# Patient Record
Sex: Female | Born: 1945
Health system: Southern US, Community
[De-identification: ages and names within clinical notes are randomized; demographics above are authoritative.]

## PROBLEM LIST (undated history)

## (undated) DIAGNOSIS — I2721 Secondary pulmonary arterial hypertension: Secondary | ICD-10-CM

## (undated) DIAGNOSIS — R0600 Dyspnea, unspecified: Secondary | ICD-10-CM

## (undated) DIAGNOSIS — A419 Sepsis, unspecified organism: Secondary | ICD-10-CM

## (undated) DIAGNOSIS — E035 Myxedema coma: Secondary | ICD-10-CM

## (undated) DIAGNOSIS — I739 Peripheral vascular disease, unspecified: Secondary | ICD-10-CM

## (undated) DIAGNOSIS — M1990 Unspecified osteoarthritis, unspecified site: Secondary | ICD-10-CM

## (undated) DIAGNOSIS — E039 Hypothyroidism, unspecified: Secondary | ICD-10-CM

## (undated) DIAGNOSIS — Z972 Presence of dental prosthetic device (complete) (partial): Secondary | ICD-10-CM

## (undated) DIAGNOSIS — I2510 Atherosclerotic heart disease of native coronary artery without angina pectoris: Secondary | ICD-10-CM

## (undated) DIAGNOSIS — G9341 Metabolic encephalopathy: Secondary | ICD-10-CM

## (undated) DIAGNOSIS — Z86711 Personal history of pulmonary embolism: Secondary | ICD-10-CM

## (undated) DIAGNOSIS — I3139 Other pericardial effusion (noninflammatory): Secondary | ICD-10-CM

## (undated) HISTORY — PX: TUBAL LIGATION: SHX77

---

## 2002-03-27 ENCOUNTER — Emergency Department (HOSPITAL_COMMUNITY): Admission: EM | Admit: 2002-03-27 | Discharge: 2002-03-27 | Payer: Self-pay | Admitting: Internal Medicine

## 2003-12-24 ENCOUNTER — Other Ambulatory Visit: Payer: Self-pay

## 2010-06-18 ENCOUNTER — Emergency Department (HOSPITAL_COMMUNITY): Admission: EM | Admit: 2010-06-18 | Discharge: 2010-06-18 | Payer: Self-pay | Admitting: Emergency Medicine

## 2010-07-13 ENCOUNTER — Emergency Department (HOSPITAL_COMMUNITY): Admission: EM | Admit: 2010-07-13 | Discharge: 2010-07-14 | Payer: Self-pay | Admitting: Emergency Medicine

## 2010-07-14 ENCOUNTER — Ambulatory Visit: Payer: Self-pay | Admitting: Vascular Surgery

## 2010-07-14 ENCOUNTER — Ambulatory Visit (HOSPITAL_COMMUNITY): Admission: RE | Admit: 2010-07-14 | Discharge: 2010-07-14 | Payer: Self-pay

## 2011-02-15 LAB — CBC
HCT: 39.1 % (ref 36.0–46.0)
MCH: 31.6 pg (ref 26.0–34.0)
Platelets: 244 10*3/uL (ref 150–400)
RBC: 4.12 MIL/uL (ref 3.87–5.11)
RDW: 13.9 % (ref 11.5–15.5)
WBC: 6.4 10*3/uL (ref 4.0–10.5)

## 2011-02-15 LAB — BASIC METABOLIC PANEL
CO2: 28 mEq/L (ref 19–32)
Calcium: 9.5 mg/dL (ref 8.4–10.5)
Creatinine, Ser: 1.1 mg/dL (ref 0.4–1.2)
GFR calc Af Amer: >60 mL/min (ref >60)
Glucose, Bld: 98 mg/dL (ref 70–99)
Potassium: 4 mEq/L (ref 3.5–5.1)

## 2011-02-15 LAB — DIFFERENTIAL
Basophils Absolute: 0.1 10*3/uL (ref 0.0–0.1)
Eosinophils Absolute: 0.3 10*3/uL (ref 0.0–0.7)
Lymphocytes Relative: 35 % (ref 12–46)
Monocytes Relative: 7 % (ref 3–12)
Neutro Abs: 3.4 10*3/uL (ref 1.7–7.7)

## 2020-02-20 ENCOUNTER — Ambulatory Visit: Payer: Self-pay | Attending: Internal Medicine

## 2020-06-29 ENCOUNTER — Other Ambulatory Visit: Payer: Self-pay

## 2020-06-29 ENCOUNTER — Emergency Department
Admission: EM | Admit: 2020-06-29 | Discharge: 2020-06-29 | Disposition: A | Discharge status: Home / Self Care | Payer: Medicaid Other | Attending: Emergency Medicine | Admitting: Emergency Medicine

## 2020-06-29 DIAGNOSIS — R197 Diarrhea, unspecified: Secondary | ICD-10-CM | POA: Diagnosis not present

## 2020-06-29 DIAGNOSIS — R519 Headache, unspecified: Secondary | ICD-10-CM | POA: Insufficient documentation

## 2020-06-29 DIAGNOSIS — Z5321 Procedure and treatment not carried out due to patient leaving prior to being seen by health care provider: Secondary | ICD-10-CM | POA: Diagnosis not present

## 2020-06-29 DIAGNOSIS — R5383 Other fatigue: Secondary | ICD-10-CM | POA: Diagnosis present

## 2020-06-29 DIAGNOSIS — R05 Cough: Secondary | ICD-10-CM | POA: Insufficient documentation

## 2020-06-29 LAB — CBC
HCT: 50.1 % — ABNORMAL HIGH (ref 36.0–46.0)
Hemoglobin: 15.3 g/dL — ABNORMAL HIGH (ref 12.0–15.0)
MCH: 30 pg (ref 26.0–34.0)
MCHC: 30.5 g/dL (ref 30.0–36.0)
MCV: 98.2 fL (ref 80.0–100.0)
Platelets: 219 10*3/uL (ref 150–400)
RBC: 5.1 MIL/uL (ref 3.87–5.11)
RDW: 14.6 % (ref 11.5–15.5)
WBC: 6.7 10*3/uL (ref 4.0–10.5)
nRBC: 0 % (ref 0.0–0.2)

## 2020-06-29 LAB — BASIC METABOLIC PANEL
Anion gap: 7 (ref 5–15)
BUN: 18 mg/dL (ref 8–23)
CO2: 34 mmol/L — ABNORMAL HIGH (ref 22–32)
Calcium: 8.9 mg/dL (ref 8.9–10.3)
Chloride: 95 mmol/L — ABNORMAL LOW (ref 98–111)
Creatinine, Ser: 1.24 mg/dL — ABNORMAL HIGH (ref 0.44–1.00)
GFR calc Af Amer: 49 mL/min — ABNORMAL LOW (ref >60)
GFR calc non Af Amer: 42 mL/min — ABNORMAL LOW (ref >60)
Glucose, Bld: 103 mg/dL — ABNORMAL HIGH (ref 70–99)
Potassium: 5 mmol/L (ref 3.5–5.1)
Sodium: 136 mmol/L (ref 135–145)

## 2020-06-29 MED ORDER — SODIUM CHLORIDE 0.9% FLUSH: 3.0000 mL | Freq: Once | INTRAVENOUS | Status: DC

## 2020-06-29 NOTE — ED Notes (Signed)
This RN went to where patient was sitting due to patient c/o the oxygen "stinks and smells like a men's bathroom". Pt c/o HA since putting the oxygen on. Pt refusing to stay and be seen. This RN apologized for delay and explained to patient the importance of staying especially due to patient's low oxygen saturation. Pt refusing to stay and be seen at this time. Pt visualized walking out of ED with her daughter.

## 2020-06-29 NOTE — ED Triage Notes (Signed)
Pt arrives via POV for reports of feeling fatigued since retiring in May. Pt reports for 18 days she has been coughing up phlegm, ear feels stopped up, headache, diarrhea. Pt denies fever or chills. Pt O2 85% on RA, pt speaking in complete sentences without shob. Skin warm and dry. Pt placed on 2L [place].

## 2020-06-29 NOTE — ED Notes (Signed)
Pt 93% on 2L [place]

## 2020-06-30 ENCOUNTER — Other Ambulatory Visit: Payer: Self-pay

## 2020-06-30 ENCOUNTER — Telehealth: Payer: Self-pay | Admitting: Emergency Medicine

## 2020-06-30 NOTE — Telephone Encounter (Signed)
Called patient due to lwot to inquire about condition and follow up plans. Phone number does not work.

## 2020-08-02 DIAGNOSIS — I2699 Other pulmonary embolism without acute cor pulmonale: Secondary | ICD-10-CM

## 2020-08-02 HISTORY — DX: Other pulmonary embolism without acute cor pulmonale: I26.99

## 2020-08-05 ENCOUNTER — Emergency Department (HOSPITAL_COMMUNITY): Payer: Medicare Other

## 2020-08-05 ENCOUNTER — Inpatient Hospital Stay (HOSPITAL_COMMUNITY)
Admission: EM | Admit: 2020-08-05 | Discharge: 2020-08-18 | DRG: 870 | Disposition: A | Discharge status: Home / Self Care | Payer: Medicare Other | Attending: Family Medicine | Admitting: Family Medicine

## 2020-08-05 ENCOUNTER — Encounter (HOSPITAL_COMMUNITY): Payer: Self-pay

## 2020-08-05 DIAGNOSIS — I10 Essential (primary) hypertension: Secondary | ICD-10-CM | POA: Diagnosis present

## 2020-08-05 DIAGNOSIS — J449 Chronic obstructive pulmonary disease, unspecified: Secondary | ICD-10-CM | POA: Diagnosis present

## 2020-08-05 DIAGNOSIS — A419 Sepsis, unspecified organism: Secondary | ICD-10-CM | POA: Diagnosis present

## 2020-08-05 DIAGNOSIS — N289 Disorder of kidney and ureter, unspecified: Secondary | ICD-10-CM | POA: Diagnosis not present

## 2020-08-05 DIAGNOSIS — R0989 Other specified symptoms and signs involving the circulatory and respiratory systems: Secondary | ICD-10-CM | POA: Diagnosis not present

## 2020-08-05 DIAGNOSIS — E035 Myxedema coma: Secondary | ICD-10-CM | POA: Insufficient documentation

## 2020-08-05 DIAGNOSIS — G9341 Metabolic encephalopathy: Secondary | ICD-10-CM | POA: Diagnosis present

## 2020-08-05 DIAGNOSIS — E049 Nontoxic goiter, unspecified: Secondary | ICD-10-CM | POA: Diagnosis present

## 2020-08-05 DIAGNOSIS — R600 Localized edema: Secondary | ICD-10-CM | POA: Diagnosis present

## 2020-08-05 DIAGNOSIS — E039 Hypothyroidism, unspecified: Secondary | ICD-10-CM | POA: Diagnosis present

## 2020-08-05 DIAGNOSIS — J9602 Acute respiratory failure with hypercapnia: Secondary | ICD-10-CM | POA: Diagnosis present

## 2020-08-05 DIAGNOSIS — J9622 Acute and chronic respiratory failure with hypercapnia: Secondary | ICD-10-CM | POA: Diagnosis present

## 2020-08-05 DIAGNOSIS — I2693 Single subsegmental pulmonary embolism without acute cor pulmonale: Secondary | ICD-10-CM | POA: Diagnosis present

## 2020-08-05 DIAGNOSIS — I2721 Secondary pulmonary arterial hypertension: Secondary | ICD-10-CM | POA: Diagnosis present

## 2020-08-05 DIAGNOSIS — R112 Nausea with vomiting, unspecified: Secondary | ICD-10-CM | POA: Diagnosis not present

## 2020-08-05 DIAGNOSIS — E669 Obesity, unspecified: Secondary | ICD-10-CM | POA: Diagnosis present

## 2020-08-05 DIAGNOSIS — Z01818 Encounter for other preprocedural examination: Secondary | ICD-10-CM

## 2020-08-05 DIAGNOSIS — I700 Atherosclerosis of aorta: Secondary | ICD-10-CM | POA: Diagnosis present

## 2020-08-05 DIAGNOSIS — J9690 Respiratory failure, unspecified, unspecified whether with hypoxia or hypercapnia: Secondary | ICD-10-CM

## 2020-08-05 DIAGNOSIS — E2740 Unspecified adrenocortical insufficiency: Secondary | ICD-10-CM | POA: Diagnosis present

## 2020-08-05 DIAGNOSIS — I313 Pericardial effusion (noninflammatory): Secondary | ICD-10-CM | POA: Diagnosis present

## 2020-08-05 DIAGNOSIS — J9811 Atelectasis: Secondary | ICD-10-CM | POA: Diagnosis present

## 2020-08-05 DIAGNOSIS — I2699 Other pulmonary embolism without acute cor pulmonale: Secondary | ICD-10-CM | POA: Diagnosis present

## 2020-08-05 DIAGNOSIS — J440 Chronic obstructive pulmonary disease with acute lower respiratory infection: Secondary | ICD-10-CM | POA: Diagnosis present

## 2020-08-05 DIAGNOSIS — J439 Emphysema, unspecified: Secondary | ICD-10-CM

## 2020-08-05 DIAGNOSIS — N179 Acute kidney failure, unspecified: Secondary | ICD-10-CM | POA: Diagnosis present

## 2020-08-05 DIAGNOSIS — I2510 Atherosclerotic heart disease of native coronary artery without angina pectoris: Secondary | ICD-10-CM | POA: Diagnosis present

## 2020-08-05 DIAGNOSIS — I3139 Other pericardial effusion (noninflammatory): Secondary | ICD-10-CM | POA: Diagnosis present

## 2020-08-05 DIAGNOSIS — E063 Autoimmune thyroiditis: Secondary | ICD-10-CM

## 2020-08-05 DIAGNOSIS — J9621 Acute and chronic respiratory failure with hypoxia: Secondary | ICD-10-CM | POA: Diagnosis present

## 2020-08-05 DIAGNOSIS — Z716 Tobacco abuse counseling: Secondary | ICD-10-CM

## 2020-08-05 DIAGNOSIS — I2584 Coronary atherosclerosis due to calcified coronary lesion: Secondary | ICD-10-CM | POA: Diagnosis not present

## 2020-08-05 DIAGNOSIS — F17210 Nicotine dependence, cigarettes, uncomplicated: Secondary | ICD-10-CM | POA: Diagnosis present

## 2020-08-05 DIAGNOSIS — J189 Pneumonia, unspecified organism: Secondary | ICD-10-CM | POA: Diagnosis present

## 2020-08-05 DIAGNOSIS — J441 Chronic obstructive pulmonary disease with (acute) exacerbation: Secondary | ICD-10-CM | POA: Diagnosis not present

## 2020-08-05 DIAGNOSIS — D751 Secondary polycythemia: Secondary | ICD-10-CM | POA: Diagnosis present

## 2020-08-05 DIAGNOSIS — I739 Peripheral vascular disease, unspecified: Secondary | ICD-10-CM | POA: Diagnosis present

## 2020-08-05 DIAGNOSIS — Z6838 Body mass index (BMI) 38.0-38.9, adult: Secondary | ICD-10-CM

## 2020-08-05 DIAGNOSIS — Z20822 Contact with and (suspected) exposure to covid-19: Secondary | ICD-10-CM | POA: Diagnosis present

## 2020-08-05 DIAGNOSIS — R001 Bradycardia, unspecified: Secondary | ICD-10-CM | POA: Diagnosis present

## 2020-08-05 DIAGNOSIS — J9601 Acute respiratory failure with hypoxia: Secondary | ICD-10-CM | POA: Diagnosis not present

## 2020-08-05 DIAGNOSIS — R680 Hypothermia, not associated with low environmental temperature: Secondary | ICD-10-CM | POA: Diagnosis present

## 2020-08-05 LAB — COMPREHENSIVE METABOLIC PANEL
ALT: 12 U/L (ref 0–44)
AST: 14 U/L — ABNORMAL LOW (ref 15–41)
Albumin: 3.3 g/dL — ABNORMAL LOW (ref 3.5–5.0)
Alkaline Phosphatase: 66 U/L (ref 38–126)
Anion gap: 9 (ref 5–15)
BUN: 30 mg/dL — ABNORMAL HIGH (ref 8–23)
CO2: 33 mmol/L — ABNORMAL HIGH (ref 22–32)
Calcium: 8.9 mg/dL (ref 8.9–10.3)
Chloride: 94 mmol/L — ABNORMAL LOW (ref 98–111)
Creatinine, Ser: 1.66 mg/dL — ABNORMAL HIGH (ref 0.44–1.00)
GFR calc Af Amer: 34 mL/min — ABNORMAL LOW (ref >60)
GFR calc non Af Amer: 30 mL/min — ABNORMAL LOW (ref >60)
Glucose, Bld: 95 mg/dL (ref 70–99)
Potassium: 5 mmol/L (ref 3.5–5.1)
Sodium: 136 mmol/L (ref 135–145)
Total Bilirubin: 1.3 mg/dL — ABNORMAL HIGH (ref 0.3–1.2)
Total Protein: 6.7 g/dL (ref 6.5–8.1)

## 2020-08-05 LAB — BLOOD GAS, ARTERIAL
Acid-Base Excess: 6.4 mmol/L — ABNORMAL HIGH (ref 0.0–2.0)
Bicarbonate: 35.5 mmol/L — ABNORMAL HIGH (ref 20.0–28.0)
Drawn by: 38340
FIO2: 36
O2 Saturation: 94.3 %
Patient temperature: 37
pCO2 arterial: 115 mmHg (ref 32.0–48.0)
pH, Arterial: 7.118 — CL (ref 7.350–7.450)
pO2, Arterial: 87.8 mmHg (ref 83.0–108.0)

## 2020-08-05 LAB — CBC WITH DIFFERENTIAL/PLATELET
Abs Immature Granulocytes: 0.06 10*3/uL (ref 0.00–0.07)
Basophils Absolute: 0 10*3/uL (ref 0.0–0.1)
Basophils Relative: 1 %
Eosinophils Absolute: 0 10*3/uL (ref 0.0–0.5)
Eosinophils Relative: 1 %
HCT: 59.5 % — ABNORMAL HIGH (ref 36.0–46.0)
Hemoglobin: 16.8 g/dL — ABNORMAL HIGH (ref 12.0–15.0)
Immature Granulocytes: 1 %
Lymphocytes Relative: 23 %
Lymphs Abs: 1.3 10*3/uL (ref 0.7–4.0)
MCH: 27.8 pg (ref 26.0–34.0)
MCHC: 28.2 g/dL — ABNORMAL LOW (ref 30.0–36.0)
MCV: 98.5 fL (ref 80.0–100.0)
Monocytes Absolute: 0.4 10*3/uL (ref 0.1–1.0)
Monocytes Relative: 7 %
Neutro Abs: 3.8 10*3/uL (ref 1.7–7.7)
Neutrophils Relative %: 67 %
Platelets: 186 10*3/uL (ref 150–400)
RBC: 6.04 MIL/uL — ABNORMAL HIGH (ref 3.87–5.11)
RDW: 16.3 % — ABNORMAL HIGH (ref 11.5–15.5)
WBC: 5.6 10*3/uL (ref 4.0–10.5)
nRBC: 0.4 % — ABNORMAL HIGH (ref 0.0–0.2)

## 2020-08-05 LAB — SARS CORONAVIRUS 2 BY RT PCR (HOSPITAL ORDER, PERFORMED IN ~~LOC~~ HOSPITAL LAB): SARS Coronavirus 2: NEGATIVE

## 2020-08-05 LAB — FIBRINOGEN: Fibrinogen: 441 mg/dL (ref 210–475)

## 2020-08-05 LAB — D-DIMER, QUANTITATIVE: D-Dimer, Quant: >20 ug/mL-FEU — ABNORMAL HIGH (ref 0.00–0.50)

## 2020-08-05 LAB — BRAIN NATRIURETIC PEPTIDE: B Natriuretic Peptide: 908.3 pg/mL — ABNORMAL HIGH (ref 0.0–100.0)

## 2020-08-05 LAB — PROTIME-INR
INR: 1.1 (ref 0.8–1.2)
Prothrombin Time: 13.5 seconds (ref 11.4–15.2)

## 2020-08-05 LAB — LACTIC ACID, PLASMA: Lactic Acid, Venous: 1.3 mmol/L (ref 0.5–1.9)

## 2020-08-05 LAB — TRIGLYCERIDES: Triglycerides: 137 mg/dL (ref <150)

## 2020-08-05 LAB — LACTATE DEHYDROGENASE: LDH: 171 U/L (ref 98–192)

## 2020-08-05 LAB — C-REACTIVE PROTEIN: CRP: 0.8 mg/dL (ref <1.0)

## 2020-08-05 LAB — TROPONIN I (HIGH SENSITIVITY): Troponin I (High Sensitivity): 19 ng/L — ABNORMAL HIGH (ref <18)

## 2020-08-05 LAB — FERRITIN: Ferritin: 8 ng/mL — ABNORMAL LOW (ref 11–307)

## 2020-08-05 LAB — PROCALCITONIN: Procalcitonin: <0.1 ng/mL

## 2020-08-05 LAB — APTT: aPTT: 37 seconds — ABNORMAL HIGH (ref 24–36)

## 2020-08-05 MED ORDER — ALBUTEROL SULFATE (2.5 MG/3ML) 0.083% IN NEBU: 2.5000 mg | INHALATION_SOLUTION | RESPIRATORY_TRACT | Status: DC | PRN

## 2020-08-05 MED ORDER — NICOTINE 21 MG/24HR TD PT24
21.0000 mg | MEDICATED_PATCH | Freq: Every day | TRANSDERMAL | Status: DC
  Administered 2020-08-05 – 2020-08-18 (×14): 21 mg via TRANSDERMAL
  Filled 2020-08-05 (×14): qty 1

## 2020-08-05 MED ORDER — ALBUTEROL (5 MG/ML) CONTINUOUS INHALATION SOLN
10.0000 mg | INHALATION_SOLUTION | RESPIRATORY_TRACT | Status: AC
  Administered 2020-08-05: 10 mg via RESPIRATORY_TRACT
  Filled 2020-08-05: qty 20

## 2020-08-05 MED ORDER — HEPARIN (PORCINE) 25000 UT/250ML-% IV SOLN
1150.0000 [IU]/h | INTRAVENOUS | Status: DC
  Administered 2020-08-05: 1100 [IU]/h via INTRAVENOUS
  Administered 2020-08-06: 1150 [IU]/h via INTRAVENOUS
  Administered 2020-08-08 – 2020-08-10 (×3): 1000 [IU]/h via INTRAVENOUS
  Administered 2020-08-11: 1100 [IU]/h via INTRAVENOUS
  Administered 2020-08-12: 1150 [IU]/h via INTRAVENOUS
  Filled 2020-08-05 (×9): qty 250

## 2020-08-05 MED ORDER — UMECLIDINIUM BROMIDE 62.5 MCG/INH IN AEPB
1.0000 | INHALATION_SPRAY | Freq: Every day | RESPIRATORY_TRACT | Status: DC
  Filled 2020-08-05: qty 7

## 2020-08-05 MED ORDER — IPRATROPIUM BROMIDE 0.02 % IN SOLN
0.5000 mg | RESPIRATORY_TRACT | Status: AC
  Administered 2020-08-05: 0.5 mg via RESPIRATORY_TRACT
  Filled 2020-08-05: qty 2.5

## 2020-08-05 MED ORDER — IOHEXOL 350 MG/ML SOLN
65.0000 mL | Freq: Once | INTRAVENOUS | Status: AC | PRN
  Administered 2020-08-05: 65 mL via INTRAVENOUS

## 2020-08-05 MED ORDER — MOMETASONE FURO-FORMOTEROL FUM 200-5 MCG/ACT IN AERO
1.0000 | INHALATION_SPRAY | Freq: Two times a day (BID) | RESPIRATORY_TRACT | Status: DC
  Filled 2020-08-05: qty 8.8

## 2020-08-05 MED ORDER — HEPARIN BOLUS VIA INFUSION
4000.0000 [IU] | Freq: Once | INTRAVENOUS | Status: AC
  Administered 2020-08-05: 4000 [IU] via INTRAVENOUS
  Filled 2020-08-05: qty 4000

## 2020-08-05 MED ORDER — PREDNISONE 10 MG PO TABS
40.0000 mg | ORAL_TABLET | Freq: Every day | ORAL | Status: DC
  Administered 2020-08-06 – 2020-08-07 (×2): 40 mg via ORAL
  Filled 2020-08-05 (×2): qty 4

## 2020-08-05 MED ORDER — METHYLPREDNISOLONE SODIUM SUCC 125 MG IJ SOLR
125.0000 mg | Freq: Once | INTRAMUSCULAR | Status: AC
  Administered 2020-08-05: 125 mg via INTRAVENOUS
  Filled 2020-08-05: qty 2

## 2020-08-05 NOTE — ED Triage Notes (Signed)
Pt to rm 13 from lobby. Pt presented to triage with 02 sat reading 52% and c/o shortness of breath. Pt states she has been sick since 07/02/20, has had shortness of breath, cough, diarrhea. Pt states o2 sat =51% at home, pt does not wear oxygen at home.

## 2020-08-05 NOTE — H&P (Addendum)
History and Physical    Miranda Cohen PYP:[id number] DOB: [birth year] DOA: 08/05/2020  PCP: Patient, No Pcp Per  Patient coming from: Home  I have personally briefly reviewed patient's old medical records in [facility]  Chief Complaint: SOB  HPI: Miranda Cohen is a 74 y.o. female with medical history significant of no known chronic medical conditions (doesn't sound like she sees doctors).  Smokes 1-2 PPD for entire life.  She reports that she recently was seen in the emergency department, for shortness of breath.  However this seems to have been approximately 5 weeks ago on July 29.  The patient reports that she essentially left AGAINST MEDICAL ADVICE, because she did not want to wait.   She was told that she had low oxygen at the time.  She presents again today with this progressive shortness of breath which is now left her so short of breath that she can barely walk 2 or 3 feet without having to stop to catch her breath.  She cannot lie flat, she denies have any fevers, she does have some coughing.  There is no significant swelling of the legs.  She did start having diarrhea yesterday but has not had any vomiting.  She has no appetite and has some nausea.  She has no fevers or chills, no sick contact, she has 2 sons that live at home with her that have to help her get around, they did not use to have to do that.  She does not wear oxygen at home, she was found to be in the 50% range on arrival regards to oxygenation   ED Course: Creat 1.66 up from 1.24 in July.  Bicarb 33 (34 in July).  D.Dimer > 20  CTA shows small PE, also shows COPD, mod pericardial effusion, small pulm effusions, PAH findings in lungs.  No infiltrate, no pulm edema.  BNP 900  Trop 19  Procalcitonin neg  COVID neg   Review of Systems: As per HPI, otherwise all review of systems negative.  History reviewed. No pertinent past medical history.  History reviewed. No pertinent surgical history.    reports that she has been smoking cigarettes. She has been smoking about 1.50 packs per day. She does not have any smokeless tobacco history on file. She reports that she does not drink alcohol and does not use drugs.  No Known Allergies  History reviewed. No pertinent family history.   Prior to Admission medications   Medication Sig Start Date End Date Taking? Authorizing Provider  acetaminophen (TYLENOL) 500 MG tablet Take 1,000 mg by mouth every 6 (six) hours as needed for headache (pain).   Yes [provider]    Physical Exam: Vitals:   08/05/20 1842 08/05/20 1930 08/05/20 2000 08/05/20 2050  BP: 127/65     Pulse: 76 74 69   Resp: (!) 24 (!) 23 20   Temp:      TempSrc:      SpO2: (!) 89% 93% (!) 89% 93%  Weight:      Height:        Constitutional: NAD, calm, comfortable Eyes: PERRL, lids and conjunctivae normal ENMT: Mucous membranes are moist. Posterior pharynx clear of any exudate or lesions.Normal dentition.  Neck: normal, supple, no masses, no thyromegaly Respiratory: clear to auscultation bilaterally, no wheezing, no crackles. Normal respiratory effort. No accessory muscle use.  Cardiovascular: Regular rate and rhythm, no murmurs / rubs / gallops. No extremity edema. 2+ pedal pulses. No carotid bruits.  Abdomen: no tenderness, no masses palpated. No hepatosplenomegaly. Bowel sounds positive.  Musculoskeletal: no clubbing / cyanosis. No joint deformity upper and lower extremities. Good ROM, no contractures. Normal muscle tone.  Skin: Candida under L breast Neurologic: MAE, follows commands, speech somewhat difficult to understand, wakes up to voice but sleepy. Psychiatric: Normal judgment and insight. Alert and oriented x 3. Normal mood.    Labs on Admission: I have personally reviewed following labs and imaging studies  CBC: Recent Labs  Lab 08/05/20 1505  WBC 5.6  NEUTROABS 3.8  HGB 16.8*  HCT 59.5*  MCV 98.5  PLT 186   Basic Metabolic  Panel: Recent Labs  Lab 08/05/20 1505  NA 136  K 5.0  CL 94*  CO2 33*  GLUCOSE 95  BUN 30*  CREATININE 1.66*  CALCIUM 8.9   GFR: Estimated Creatinine Clearance: 25.9 mL/min (A) (by C-G formula based on SCr of 1.66 mg/dL (H)). Liver Function Tests: Recent Labs  Lab 08/05/20 1505  AST 14*  ALT 12  ALKPHOS 66  BILITOT 1.3*  PROT 6.7  ALBUMIN 3.3*   No results for input(s): LIPASE, AMYLASE in the last 168 hours. No results for input(s): AMMONIA in the last 168 hours. Coagulation Profile: Recent Labs  Lab 08/05/20 1700  INR 1.1   Cardiac Enzymes: No results for input(s): CKTOTAL, CKMB, CKMBINDEX, TROPONINI in the last 168 hours. BNP (last 3 results) No results for input(s): PROBNP in the last 8760 hours. HbA1C: No results for input(s): HGBA1C in the last 72 hours. CBG: No results for input(s): GLUCAP in the last 168 hours. Lipid Profile: Recent Labs    08/05/20 1505  TRIG 137   Thyroid Function Tests: No results for input(s): TSH, T4TOTAL, FREET4, T3FREE, THYROIDAB in the last 72 hours. Anemia Panel: Recent Labs    08/05/20 1555  FERRITIN 8*   Urine analysis: No results found for: COLORURINE, APPEARANCEUR, LABSPEC, PHURINE, GLUCOSEU, HGBUR, BILIRUBINUR, KETONESUR, PROTEINUR, UROBILINOGEN, NITRITE, LEUKOCYTESUR  Radiological Exams on Admission: CT Angio Chest PE W and/or Wo Contrast  Result Date: 08/05/2020 CLINICAL DATA:  Shortness of breath and marked hypoxia. The patient reports being ill since 07/02/2020 with shortness of breath, cough and diarrhea. EXAM: CT ANGIOGRAPHY CHEST WITH CONTRAST TECHNIQUE: Multidetector CT imaging of the chest was performed using the standard protocol during bolus administration of intravenous contrast. Multiplanar CT image reconstructions and MIPs were obtained to evaluate the vascular anatomy. CONTRAST:  2mL OMNIPAQUE IOHEXOL 350 MG/ML SOLN COMPARISON:  Portable chest obtained earlier today. FINDINGS: Cardiovascular: Enlarged  heart. Moderate-sized pericardial effusion with a maximum thickness of 3.2 cm. Small elongated right lower lobe pulmonary arterial filling defect, best seen on image number 233 series 7. No other pulmonary arterial filling defects are seen. Enlarged central pulmonary arteries. The main pulmonary artery segment measures 3.3 cm in diameter on image number 160 series 7. Atheromatous calcifications, including the coronary arteries and aorta. Mediastinum/Nodes: Diffusely enlarged thyroid gland with no visible nodules in the included portions. No enlarged lymph nodes. Lungs/Pleura: Small bilateral pleural effusions. Bilateral lower lobe atelectasis, greater on the left. There is also posterior left upper lobe atelectasis. Mild bilateral centrilobular and paraseptal bullous changes. Upper Abdomen: Unremarkable. Musculoskeletal: Thoracic spine degenerative changes. Review of the MIP images confirms the above findings. IMPRESSION: 1. Single small right lower lobe pulmonary embolus, not large enough cause right heart strain. 2. Cardiomegaly and moderate-sized pericardial effusion. 3. Small bilateral pleural effusions. 4. Bilateral lower lobe atelectasis, greater on the left. 5. Posterior left upper  lobe atelectasis. 6. Enlarged central pulmonary arteries, compatible with pulmonary arterial hypertension. 7. Mild changes of COPD. 8. Thyroid goiter. Recommend elective outpatient thyroid ultrasound (ref: J Am Coll Radiol. 2015 Feb;12(2): 143-50). Critical Value/emergent results were called by telephone at the time of interpretation on 08/05/2020 at 6:51 pm to provider Army MeliaLaura Murphy, PA, who verbally acknowledged these results. Aortic Atherosclerosis (ICD10-I70.0) and Emphysema (ICD10-J43.9). Electronically Signed   By: Beckie SaltsSteven  Reid M.D.   On: 08/05/2020 18:59   DG Chest Port 1 View  Result Date: 08/05/2020 CLINICAL DATA:  Cough and shortness of breath.  COVID-19 positive. EXAM: PORTABLE CHEST 1 VIEW COMPARISON:  July 13, 2010  FINDINGS: Calcific atherosclerotic disease of the aorta. The lower portion of the left lung and left cardiac border obscured by dense consolidation or pleural effusion. Minimal peribronchial nodularity in the right lower lobe. Osseous structures are without acute abnormality. Soft tissues are grossly normal. IMPRESSION: 1. The lower portion of the left lung and left cardiac border are obscured by dense consolidation or pleural effusion. 2. Minimal peribronchial nodularity in the right lower lobe likely infectious or inflammatory. Electronically Signed   By: Ted Mcalpineobrinka  Dimitrova M.D.   On: 08/05/2020 15:58    EKG: Independently reviewed.  Assessment/Plan Principal Problem:   Acute respiratory failure with hypoxia and hypercapnia (HCC) Active Problems:   Acute pulmonary embolism (HCC)   Pericardial effusion   COPD (chronic obstructive pulmonary disease) (HCC)   Renal insufficiency    1. Acute resp failure with hypoxia and hypercapnia - 1. Probably acute on chronic but chronic component not yet diagnosed. 2. Sounds like shes a very heavy smoker with high PY history smoking > 1ppd for most of her life, ongoing 1. Nicotine patch 3. Likely multifactorial including underlying undiagnosed COPD, suspect underlying undiagnosed PAH, also has small PE that by itself would be insignificant but probably pushed her over the edge in the setting of underlying lung disease. 4. Checking ABG 1. ABG indicates acute hypercapnic component as well: pH 7.118, PCO2 115! 2. Starting BIPAP 5. Cont pulse ox 2. COPD exacerbation - 1. COPD pathway 2. LABA, LAMA, INH steroid 3. Prednisone daily (got solumedrol in ED) 4. Adult wheeze protocol 5. PRN SABA 3. Acute PE - 1. Heparin per pharm 2. 2d echo 3. US LE for DVTs 4. Tele monitor 4. Pericardial effusion - 1. Also ? PAH 2. 2d echo ordered 3. No tamponade physiology 5. Renal insufficiency - 1. Unclear how much is acute vs chronic  DVT prophylaxis: Heparin  gtt Code Status: Full Family Communication: No family in room Disposition Plan: Home  Consults called: None Admission status: Admit to inpatient  Severity of Illness: The appropriate patient status for this patient is INPATIENT. Inpatient status is judged to be reasonable and necessary in order to provide the required intensity of service to ensure the patient's safety. The patient's presenting symptoms, physical exam findings, and initial radiographic and laboratory data in the context of their chronic comorbidities is felt to place them at high risk for further clinical deterioration. Furthermore, it is not anticipated that the patient will be medically stable for discharge from the hospital within 2 midnights of admission. The following factors support the patient status of inpatient.   IP status due to respiratory failure with new O2 requirement.   * I certify that at the point of admission it is my clinical judgment that the patient will require inpatient hospital care spanning beyond 2 midnights from the point of admission due to high  intensity of service, high risk for further deterioration and high frequency of surveillance required.*    Rashawn Rayman M. DO [facility]  How to contact the [facility] Attending or Consulting provider 7A - 7P or covering provider during after hours 7P -7A, for this patient?  1. Check the care team in [facility] and look for a) attending/consulting [facility] provider listed and b) the [facility] team listed 2. Log into [email or web address]  Amion Physician Scheduling and messaging for groups and whole hospitals  On call and physician scheduling software for group practices, residents, hospitalists and other medical providers for call, clinic, rotation and shift schedules. OnCall [employer] is a hospital-wide system for scheduling doctors and paging doctors on call. EasyPlot is for scientific plotting and data analysis.  [email or web address]  and use [facility]'s universal password to  access. If you do not have the password, please contact the hospital operator.  3. Locate the [facility] provider you are looking for under [facility] and page to a number that you can be directly reached. 4. If you still have difficulty reaching the provider, please page the [facility] (Director on Call) for the Hospitalists listed on amion for assistance.  08/05/2020, 8:56 PM

## 2020-08-05 NOTE — ED Notes (Signed)
Tell pt to call  familyJustin  [phone]

## 2020-08-05 NOTE — ED Notes (Signed)
Pt to CT scan via stretcher.

## 2020-08-05 NOTE — ED Notes (Signed)
Dr Julian Reil at bedside, RT at bedside to place pt on bipap. EReport called top Molli Hazard, Charity fundraiser. Pt to 3E-11 via stretcher.

## 2020-08-05 NOTE — ED Notes (Signed)
Date and time results received: 08/05/20 2120 (use smartphrase ".now" to insert current time)  Test: ABG Critical Value: pH=7.118, PCO2=115  Name of Provider Notified: Dr. Julian Reil  Orders Received? Or Actions Taken?: Orders Received - See Orders for details

## 2020-08-05 NOTE — Progress Notes (Signed)
ANTICOAGULATION CONSULT NOTE - Initial Consult  Pharmacy Consult for heparin Indication: pulmonary embolus  No Known Allergies  Patient Measurements: Height: 5\' 5"  (165.1 cm) Weight: 68 kg (150 lb) IBW/kg (Calculated) : 57 Heparin Dosing Weight: 68kg  Vital Signs: Temp: 97.7 F (36.5 C) (09/04 1516) Temp Source: Oral (09/04 1516) BP: 127/65 (09/04 1842) Pulse Rate: 76 (09/04 1842)  Labs: Recent Labs    08/05/20 1505 08/05/20 1700  HGB 16.8*  --   HCT 59.5*  --   PLT 186  --   APTT  --  37*  LABPROT  --  13.5  INR  --  1.1  CREATININE 1.66*  --   TROPONINIHS 19*  --     Estimated Creatinine Clearance: 25.9 mL/min (A) (by C-G formula based on SCr of 1.66 mg/dL (H)).   Medical History: History reviewed. No pertinent past medical history.  Medications:  Infusions:  . heparin      Assessment: 85 yof presented to the ED with SOB. Found to have a small PE. To start IV heparin. Baseline Hgb is elevated and platelets are WNL. She is not on anticoagulation PTA.   Goal of Therapy:  Heparin level 0.3-0.7 units/ml Monitor platelets by anticoagulation protocol: Yes   Plan:  Heparin bolus 4000 units IV x 1 Heparin gtt 1100 units/hr Check an 8 hr heparin level Daily heparin level and CBC  Ai Sonnenfeld, 71 08/05/2020,7:36 PM

## 2020-08-05 NOTE — Progress Notes (Addendum)
ABG results reviewed.  Demonstrates acute on chronic hypercapnic failure.  BIPAP ordered.  Update: seems to be tolerating BIPAP quite well.  Will order repeat ABG at 2330.

## 2020-08-05 NOTE — ED Provider Notes (Signed)
[facility] EMERGENCY DEPARTMENT Provider Note   CSN: [id number] Arrival date & time: 08/05/20  1439     History Chief Complaint  Patient presents with  . Shortness of Breath    Miranda Cohen is a 74 y.o. female.  HPI   This patient is a 74 year old female, she states that she has no chronic medical conditions and denies taking any daily medications smoke approximately 1 pack of cigarettes a day and has for her almost her "entire life".  She reports that she recently was seen in the emergency department, for shortness of breath.  However this seems to have been approximately 5 weeks ago on July 29.  The patient reports that she essentially left AGAINST MEDICAL ADVICE, because she did not want to wait.  She was told that she had low oxygen at the time.  She presents again today with this progressive shortness of breath which is now left her so short of breath that she can barely walk 2 or 3 feet without having to stop to catch her breath.  She cannot lie flat, she denies have any fevers, she does have some coughing.  There is no significant swelling of the legs.  She did start having diarrhea yesterday but has not had any vomiting.  She has no appetite and has some nausea.  She has no fevers or chills, no sick contact, she has 2 sons that live at home with her that have to help her get around, they did not use to have to do that.  She does not wear oxygen at home, she was found to be in the 50% range on arrival regards to oxygenation  No past medical history on file.  There are no problems to display for this patient.    OB History   No obstetric history on file.     No family history on file.  Social History   Tobacco Use  . Smoking status: Current Every Day Smoker    Packs/day: 1.00    Types: Cigarettes  Substance Use Topics  . Alcohol use: Not on file  . Drug use: Not on file    Home Medications Prior to Admission medications   Not on File     Allergies    Patient has no allergy information on record.  Review of Systems   Review of Systems  All other systems reviewed and are negative.   Physical Exam Updated Vital Signs BP (!) 150/72 (BP Location: Right Arm)   Pulse 76   Temp 97.7 F (36.5 C) (Oral)   Resp (!) 22   Ht 1.651 m (5\' 5" )   Wt 68 kg   SpO2 100%   BMI 24.96 kg/m   Physical Exam Vitals and nursing note reviewed.  Constitutional:      General: She is in acute distress.     Appearance: She is well-developed.  HENT:     Head: Normocephalic and atraumatic.     Mouth/Throat:     Pharynx: No oropharyngeal exudate.  Eyes:     General: No scleral icterus.       Right eye: No discharge.        Left eye: No discharge.     Conjunctiva/sclera: Conjunctivae normal.     Pupils: Pupils are equal, round, and reactive to light.  Neck:     Thyroid: No thyromegaly.     Vascular: No JVD.  Cardiovascular:     Rate and Rhythm: Normal rate and regular  rhythm.     Heart sounds: Normal heart sounds. No murmur heard.  No friction rub. No gallop.   Pulmonary:     Effort: Tachypnea and respiratory distress present.     Breath sounds: Wheezing and rales present.     Comments: Tachypneic, speaks in shortened sentences, rales left greater than right, wheezing bilaterally on expiration, slight increased work of breathing Abdominal:     General: Bowel sounds are normal. There is no distension.     Palpations: Abdomen is soft. There is no mass.     Tenderness: There is no abdominal tenderness.  Musculoskeletal:        General: No tenderness. Normal range of motion.     Cervical back: Normal range of motion and neck supple.  Lymphadenopathy:     Cervical: No cervical adenopathy.  Skin:    General: Skin is warm and dry.     Findings: No erythema or rash.  Neurological:     Mental Status: She is alert.     Coordination: Coordination normal.  Psychiatric:        Behavior: Behavior normal.     ED Results /  Procedures / Treatments   Labs (all labs ordered are listed, but only abnormal results are displayed) Labs Reviewed  CBC WITH DIFFERENTIAL/PLATELET - Abnormal; Notable for the following components:      Result Value   RBC 6.04 (*)    Hemoglobin 16.8 (*)    HCT 59.5 (*)    MCHC 28.2 (*)    RDW 16.3 (*)    nRBC 0.4 (*)    All other components within normal limits  COMPREHENSIVE METABOLIC PANEL - Abnormal; Notable for the following components:   Chloride 94 (*)    CO2 33 (*)    BUN 30 (*)    Creatinine, Ser 1.66 (*)    Albumin 3.3 (*)    AST 14 (*)    Total Bilirubin 1.3 (*)    GFR calc non Af Amer 30 (*)    GFR calc Af Amer 34 (*)    All other components within normal limits  BRAIN NATRIURETIC PEPTIDE - Abnormal; Notable for the following components:   B Natriuretic Peptide 908.3 (*)    All other components within normal limits  FERRITIN - Abnormal; Notable for the following components:   Ferritin 8 (*)    All other components within normal limits  D-DIMER, QUANTITATIVE (NOT AT [facility]) - Abnormal; Notable for the following components:   D-Dimer, Quant >20.00 (*)    All other components within normal limits  APTT - Abnormal; Notable for the following components:   aPTT 37 (*)    All other components within normal limits  TROPONIN I (HIGH SENSITIVITY) - Abnormal; Notable for the following components:   Troponin I (High Sensitivity) 19 (*)    All other components within normal limits  SARS CORONAVIRUS 2 BY RT PCR (HOSPITAL ORDER, PERFORMED IN [facility])  CULTURE, BLOOD (ROUTINE X 2)  CULTURE, BLOOD (ROUTINE X 2)  URINE CULTURE  LACTIC ACID, PLASMA  PROCALCITONIN  LACTATE DEHYDROGENASE  TRIGLYCERIDES  C-REACTIVE PROTEIN  FIBRINOGEN  PROTIME-INR  LACTIC ACID, PLASMA  URINALYSIS, ROUTINE W REFLEX MICROSCOPIC  HEPARIN LEVEL (UNFRACTIONATED)  CBC  BLOOD GAS, ARTERIAL    EKG EKG Interpretation  Date/Time:  Saturday August 05 2020 14:55:41  EDT Ventricular Rate:  74 PR Interval:  120 QRS Duration: 64 QT Interval:  350 QTC Calculation: 388 R Axis:   156 Text  Interpretation: Normal sinus rhythm Low voltage QRS Septal infarct , age undetermined Possible Lateral infarct , age undetermined Abnormal ECG Confirmed by Eber HongMiller, Evonne Rinks ([id number]) on 08/05/2020 3:05:33 PM   Radiology CT Angio Chest PE W and/or Wo Contrast  Result Date: 08/05/2020 CLINICAL DATA:  Shortness of breath and marked hypoxia. The patient reports being ill since 07/02/2020 with shortness of breath, cough and diarrhea. EXAM: CT ANGIOGRAPHY CHEST WITH CONTRAST TECHNIQUE: Multidetector CT imaging of the chest was performed using the standard protocol during bolus administration of intravenous contrast. Multiplanar CT image reconstructions and MIPs were obtained to evaluate the vascular anatomy. CONTRAST:  65mL OMNIPAQUE IOHEXOL 350 MG/ML SOLN COMPARISON:  Portable chest obtained earlier today. FINDINGS: Cardiovascular: Enlarged heart. Moderate-sized pericardial effusion with a maximum thickness of 3.2 cm. Small elongated right lower lobe pulmonary arterial filling defect, best seen on image number 233 series 7. No other pulmonary arterial filling defects are seen. Enlarged central pulmonary arteries. The main pulmonary artery segment measures 3.3 cm in diameter on image number 160 series 7. Atheromatous calcifications, including the coronary arteries and aorta. Mediastinum/Nodes: Diffusely enlarged thyroid gland with no visible nodules in the included portions. No enlarged lymph nodes. Lungs/Pleura: Small bilateral pleural effusions. Bilateral lower lobe atelectasis, greater on the left. There is also posterior left upper lobe atelectasis. Mild bilateral centrilobular and paraseptal bullous changes. Upper Abdomen: Unremarkable. Musculoskeletal: Thoracic spine degenerative changes. Review of the MIP images confirms the above findings. IMPRESSION: 1. Single small right lower lobe  pulmonary embolus, not large enough cause right heart strain. 2. Cardiomegaly and moderate-sized pericardial effusion. 3. Small bilateral pleural effusions. 4. Bilateral lower lobe atelectasis, greater on the left. 5. Posterior left upper lobe atelectasis. 6. Enlarged central pulmonary arteries, compatible with pulmonary arterial hypertension. 7. Mild changes of COPD. 8. Thyroid goiter. Recommend elective outpatient thyroid ultrasound (ref: J Am Coll Radiol. 2015 Feb;12(2): 143-50). Critical Value/emergent results were called by telephone at the time of interpretation on 08/05/2020 at 6:51 pm to provider Army MeliaLaura Murphy, PA, who verbally acknowledged these results. Aortic Atherosclerosis (ICD10-I70.0) and Emphysema (ICD10-J43.9). Electronically Signed   By: Beckie SaltsSteven  Reid M.D.   On: 08/05/2020 18:59   DG Chest Port 1 View  Result Date: 08/05/2020 CLINICAL DATA:  Cough and shortness of breath.  COVID-19 positive. EXAM: PORTABLE CHEST 1 VIEW COMPARISON:  July 13, 2010 FINDINGS: Calcific atherosclerotic disease of the aorta. The lower portion of the left lung and left cardiac border obscured by dense consolidation or pleural effusion. Minimal peribronchial nodularity in the right lower lobe. Osseous structures are without acute abnormality. Soft tissues are grossly normal. IMPRESSION: 1. The lower portion of the left lung and left cardiac border are obscured by dense consolidation or pleural effusion. 2. Minimal peribronchial nodularity in the right lower lobe likely infectious or inflammatory. Electronically Signed   By: Ted Mcalpineobrinka  Dimitrova M.D.   On: 08/05/2020 15:58    Procedures .Critical Care Performed by: Eber HongMiller, Samael Blades, MD Authorized by: Eber HongMiller, Angles Trevizo, MD   Critical care provider statement:    Critical care time (minutes):  35   Critical care time was exclusive of:  Separately billable procedures and treating other patients and teaching time   Critical care was necessary to treat or prevent imminent or  life-threatening deterioration of the following conditions:  Respiratory failure and cardiac failure   Critical care was time spent personally by me on the following activities:  Blood draw for specimens, development of treatment plan with patient or surrogate, discussions with consultants, evaluation  of patient's response to treatment, examination of patient, obtaining history from patient or surrogate, ordering and performing treatments and interventions, ordering and review of laboratory studies, ordering and review of radiographic studies, pulse oximetry, re-evaluation of patient's condition and review of old charts   (including critical care time)  Medications Ordered in ED Medications - No data to display  ED Course  I have reviewed the triage vital signs and the nursing notes.  Pertinent labs & imaging results that were available during my care of the patient were reviewed by me and considered in my medical decision making (see chart for details).  Clinical Course as of Aug 05 1938  Sat Aug 05, 2020  1856 Radiology- Small PE on right, not symptomatic, no strain. Big heart with pericardial effusion, atelectasis.    [LM]    Clinical Course User Index [LM] Alden Hipp   MDM Rules/Calculators/A&P                          This patient is somewhat ill-appearing with an acute respiratory condition.  This may be related to primary lung dysfunction such as pneumonia, Covid 19 or it could even potentially be related to a coronary cause such as obstructive disease, congestive heart failure or other pulmonary infiltrative disease.  She is afebrile, she is not tachycardic, she does not appear to be fluid overloaded with regards to her peripheral vasculature, no edema of the legs and no obvious JVD though her redundant tissue in the neck makes this difficult to visualize.  We will start with x-ray, EKG, labs, the patient is agreeable we will need oxygen to keep sats high enough  D/w Dr.  Julian Reil who will admit Need multiple evaluations for her findings Critically ill with pericardial effusion, PE and COPD exacerbation on O2.  Started anticoagulation afte rd/w Dr. Julian Reil  Continuous Neb  Miranda Cohen was evaluated in Emergency Department on 08/05/2020 for the symptoms described in the history of present illness. She was evaluated in the context of the global COVID-19 pandemic, which necessitated consideration that the patient might be at risk for infection with the SARS-CoV-2 virus that causes COVID-19. Institutional protocols and algorithms that pertain to the evaluation of patients at risk for COVID-19 are in a state of rapid change based on information released by regulatory bodies including the CDC and federal and state organizations. These policies and algorithms were followed during the patient's care in the ED.   Final Clinical Impression(s) / ED Diagnoses Final diagnoses:  Pericardial effusion  Single subsegmental pulmonary embolism without acute cor pulmonale (HCC)  COPD with acute exacerbation (HCC)      Eber Hong, MD 08/05/20 1941

## 2020-08-05 NOTE — ED Notes (Signed)
Miranda Cohen, daughter, [phone] would like an update when available

## 2020-08-05 NOTE — ED Notes (Signed)
Pt not very quick to respond to stimulus. Have to talk very loud to wake patient/keep patient alert. Seems very sleepy.

## 2020-08-06 ENCOUNTER — Inpatient Hospital Stay (HOSPITAL_COMMUNITY): Payer: Medicare Other

## 2020-08-06 DIAGNOSIS — I2699 Other pulmonary embolism without acute cor pulmonale: Secondary | ICD-10-CM

## 2020-08-06 DIAGNOSIS — I313 Pericardial effusion (noninflammatory): Secondary | ICD-10-CM

## 2020-08-06 LAB — POCT I-STAT 7, (LYTES, BLD GAS, ICA,H+H)
Acid-Base Excess: 17 mmol/L — ABNORMAL HIGH (ref 0.0–2.0)
Acid-Base Excess: 10 mmol/L — ABNORMAL HIGH (ref 0.0–2.0)
Bicarbonate: 47.8 mmol/L — ABNORMAL HIGH (ref 20.0–28.0)
Bicarbonate: 35.2 mmol/L — ABNORMAL HIGH (ref 20.0–28.0)
Calcium, Ion: 1.05 mmol/L — ABNORMAL LOW (ref 1.15–1.40)
Calcium, Ion: 1.07 mmol/L — ABNORMAL LOW (ref 1.15–1.40)
HCT: 53 % — ABNORMAL HIGH (ref 36.0–46.0)
HCT: 59 % — ABNORMAL HIGH (ref 36.0–46.0)
Hemoglobin: 18 g/dL — ABNORMAL HIGH (ref 12.0–15.0)
Hemoglobin: 20.1 g/dL — ABNORMAL HIGH (ref 12.0–15.0)
O2 Saturation: 100 %
O2 Saturation: 98 %
Patient temperature: 94.4
Patient temperature: 94.4
Potassium: 4.9 mmol/L (ref 3.5–5.1)
Potassium: 5 mmol/L (ref 3.5–5.1)
Sodium: 143 mmol/L (ref 135–145)
Sodium: 135 mmol/L (ref 135–145)
TCO2: >50 mmol/L — ABNORMAL HIGH (ref 22–32)
TCO2: 37 mmol/L — ABNORMAL HIGH (ref 22–32)
pCO2 arterial: 70.7 mmHg (ref 32.0–48.0)
pCO2 arterial: 42.6 mmHg (ref 32.0–48.0)
pH, Arterial: 7.428 (ref 7.350–7.450)
pH, Arterial: 7.516 — ABNORMAL HIGH (ref 7.350–7.450)
pO2, Arterial: 259 mmHg — ABNORMAL HIGH (ref 83.0–108.0)
pO2, Arterial: 87 mmHg (ref 83.0–108.0)

## 2020-08-06 LAB — BLOOD GAS, ARTERIAL
Acid-Base Excess: 7 mmol/L — ABNORMAL HIGH (ref 0.0–2.0)
Bicarbonate: 36.2 mmol/L — ABNORMAL HIGH (ref 20.0–28.0)
Drawn by: 55062
FIO2: 70
O2 Saturation: 85.5 %
Patient temperature: 36.1
pCO2 arterial: 112 mmHg (ref 32.0–48.0)
pH, Arterial: 7.129 — CL (ref 7.350–7.450)
pO2, Arterial: 54.6 mmHg — ABNORMAL LOW (ref 83.0–108.0)

## 2020-08-06 LAB — GLUCOSE, CAPILLARY
Glucose-Capillary: 106 mg/dL — ABNORMAL HIGH (ref 70–99)
Glucose-Capillary: 111 mg/dL — ABNORMAL HIGH (ref 70–99)
Glucose-Capillary: 131 mg/dL — ABNORMAL HIGH (ref 70–99)
Glucose-Capillary: 137 mg/dL — ABNORMAL HIGH (ref 70–99)
Glucose-Capillary: 138 mg/dL — ABNORMAL HIGH (ref 70–99)
Glucose-Capillary: 95 mg/dL (ref 70–99)
Glucose-Capillary: 96 mg/dL (ref 70–99)

## 2020-08-06 LAB — CBC
HCT: 61 % — ABNORMAL HIGH (ref 36.0–46.0)
HCT: 56.3 % — ABNORMAL HIGH (ref 36.0–46.0)
Hemoglobin: 17.4 g/dL — ABNORMAL HIGH (ref 12.0–15.0)
Hemoglobin: 16.6 g/dL — ABNORMAL HIGH (ref 12.0–15.0)
MCH: 27.9 pg (ref 26.0–34.0)
MCH: 28 pg (ref 26.0–34.0)
MCHC: 28.5 g/dL — ABNORMAL LOW (ref 30.0–36.0)
MCHC: 29.5 g/dL — ABNORMAL LOW (ref 30.0–36.0)
MCV: 97.8 fL (ref 80.0–100.0)
MCV: 95.1 fL (ref 80.0–100.0)
Platelets: 135 10*3/uL — ABNORMAL LOW (ref 150–400)
Platelets: 158 10*3/uL (ref 150–400)
RBC: 6.24 MIL/uL — ABNORMAL HIGH (ref 3.87–5.11)
RBC: 5.92 MIL/uL — ABNORMAL HIGH (ref 3.87–5.11)
RDW: 16.6 % — ABNORMAL HIGH (ref 11.5–15.5)
RDW: 15.8 % — ABNORMAL HIGH (ref 11.5–15.5)
WBC: 7.5 10*3/uL (ref 4.0–10.5)
WBC: 9 10*3/uL (ref 4.0–10.5)
nRBC: 0.4 % — ABNORMAL HIGH (ref 0.0–0.2)
nRBC: 0.2 % (ref 0.0–0.2)

## 2020-08-06 LAB — ECHOCARDIOGRAM COMPLETE
AR max vel: 2.75 cm2
AV Area VTI: 2.89 cm2
AV Area mean vel: 2.71 cm2
AV Mean grad: 5 mmHg
AV Peak grad: 7.1 mmHg
Ao pk vel: 1.33 m/s
Area-P 1/2: 2.29 cm2
Height: 65 in
S' Lateral: 2 cm
Weight: 3543.23 oz

## 2020-08-06 LAB — URINALYSIS, ROUTINE W REFLEX MICROSCOPIC
Bacteria, UA: NONE SEEN
Bilirubin Urine: NEGATIVE
Glucose, UA: NEGATIVE mg/dL
Hgb urine dipstick: NEGATIVE
Ketones, ur: 20 mg/dL — AB
Leukocytes,Ua: NEGATIVE
Nitrite: NEGATIVE
Protein, ur: >=300 mg/dL — AB
Specific Gravity, Urine: 1.041 — ABNORMAL HIGH (ref 1.005–1.030)
pH: 6 (ref 5.0–8.0)

## 2020-08-06 LAB — HIV ANTIBODY (ROUTINE TESTING W REFLEX): HIV Screen 4th Generation wRfx: NONREACTIVE

## 2020-08-06 LAB — MRSA PCR SCREENING: MRSA by PCR: NEGATIVE

## 2020-08-06 LAB — BASIC METABOLIC PANEL
Anion gap: 13 (ref 5–15)
BUN: 28 mg/dL — ABNORMAL HIGH (ref 8–23)
CO2: 30 mmol/L (ref 22–32)
Calcium: 8.7 mg/dL — ABNORMAL LOW (ref 8.9–10.3)
Chloride: 95 mmol/L — ABNORMAL LOW (ref 98–111)
Creatinine, Ser: 1.47 mg/dL — ABNORMAL HIGH (ref 0.44–1.00)
GFR calc Af Amer: 40 mL/min — ABNORMAL LOW (ref >60)
GFR calc non Af Amer: 34 mL/min — ABNORMAL LOW (ref >60)
Glucose, Bld: 158 mg/dL — ABNORMAL HIGH (ref 70–99)
Potassium: 4.7 mmol/L (ref 3.5–5.1)
Sodium: 138 mmol/L (ref 135–145)

## 2020-08-06 LAB — SARS CORONAVIRUS 2 BY RT PCR (HOSPITAL ORDER, PERFORMED IN ~~LOC~~ HOSPITAL LAB): SARS Coronavirus 2: NEGATIVE

## 2020-08-06 LAB — HEMOGLOBIN A1C
Hgb A1c MFr Bld: 6 % — ABNORMAL HIGH (ref 4.8–5.6)
Mean Plasma Glucose: 125.5 mg/dL

## 2020-08-06 LAB — HEPARIN LEVEL (UNFRACTIONATED)
Heparin Unfractionated: 0.35 IU/mL (ref 0.30–0.70)
Heparin Unfractionated: 0.65 IU/mL (ref 0.30–0.70)

## 2020-08-06 MED ORDER — PROPOFOL 1000 MG/100ML IV EMUL
0.0000 ug/kg/min | INTRAVENOUS | Status: DC
  Administered 2020-08-06: 5 ug/kg/min via INTRAVENOUS
  Administered 2020-08-07: 20 ug/kg/min via INTRAVENOUS
  Administered 2020-08-07: 15 ug/kg/min via INTRAVENOUS
  Administered 2020-08-08: 40 ug/kg/min via INTRAVENOUS
  Administered 2020-08-08: 20 ug/kg/min via INTRAVENOUS
  Filled 2020-08-06: qty 200
  Filled 2020-08-06 (×5): qty 100

## 2020-08-06 MED ORDER — ROCURONIUM BROMIDE 50 MG/5ML IV SOLN: 100.0000 mg | Freq: Once | INTRAVENOUS | Status: AC

## 2020-08-06 MED ORDER — FENTANYL CITRATE (PF) 100 MCG/2ML IJ SOLN: 100.0000 ug | Freq: Once | INTRAMUSCULAR | Status: AC

## 2020-08-06 MED ORDER — SODIUM CHLORIDE 0.9 % IV SOLN
2.0000 g | Freq: Two times a day (BID) | INTRAVENOUS | Status: DC
  Administered 2020-08-06 – 2020-08-08 (×4): 2 g via INTRAVENOUS
  Filled 2020-08-06 (×5): qty 2

## 2020-08-06 MED ORDER — SODIUM CHLORIDE 0.9 % IV SOLN: 1.0000 g | INTRAVENOUS | Status: DC

## 2020-08-06 MED ORDER — IPRATROPIUM-ALBUTEROL 0.5-2.5 (3) MG/3ML IN SOLN
3.0000 mL | Freq: Four times a day (QID) | RESPIRATORY_TRACT | Status: DC
  Administered 2020-08-07: 3 mL via RESPIRATORY_TRACT
  Filled 2020-08-06: qty 3

## 2020-08-06 MED ORDER — SODIUM CHLORIDE 0.9 % IV SOLN
2.0000 g | Freq: Once | INTRAVENOUS | Status: AC
  Administered 2020-08-06: 2 g via INTRAVENOUS
  Filled 2020-08-06: qty 2

## 2020-08-06 MED ORDER — FENTANYL CITRATE (PF) 100 MCG/2ML IJ SOLN: 25.0000 ug | Freq: Once | INTRAMUSCULAR | Status: DC

## 2020-08-06 MED ORDER — PHENYLEPHRINE 40 MCG/ML (10ML) SYRINGE FOR IV PUSH (FOR BLOOD PRESSURE SUPPORT)
PREFILLED_SYRINGE | INTRAVENOUS | Status: AC
  Filled 2020-08-06: qty 20

## 2020-08-06 MED ORDER — FENTANYL CITRATE (PF) 100 MCG/2ML IJ SOLN
INTRAMUSCULAR | Status: AC
  Administered 2020-08-06: 100 ug via INTRAVENOUS
  Filled 2020-08-06: qty 2

## 2020-08-06 MED ORDER — ALBUTEROL SULFATE (2.5 MG/3ML) 0.083% IN NEBU: 2.5000 mg | INHALATION_SOLUTION | RESPIRATORY_TRACT | Status: DC | PRN

## 2020-08-06 MED ORDER — CHLORHEXIDINE GLUCONATE CLOTH 2 % EX PADS
6.0000 | MEDICATED_PAD | Freq: Every day | CUTANEOUS | Status: DC
  Administered 2020-08-06 – 2020-08-07 (×2): 6 via TOPICAL

## 2020-08-06 MED ORDER — IPRATROPIUM-ALBUTEROL 0.5-2.5 (3) MG/3ML IN SOLN
3.0000 mL | Freq: Four times a day (QID) | RESPIRATORY_TRACT | Status: DC
  Administered 2020-08-06 (×2): 3 mL via RESPIRATORY_TRACT
  Filled 2020-08-06 (×2): qty 3

## 2020-08-06 MED ORDER — ETOMIDATE 2 MG/ML IV SOLN
INTRAVENOUS | Status: AC
  Administered 2020-08-06: 15 mg via INTRAVENOUS
  Filled 2020-08-06: qty 10

## 2020-08-06 MED ORDER — FENTANYL BOLUS VIA INFUSION
25.0000 ug | INTRAVENOUS | Status: DC | PRN
  Administered 2020-08-06 – 2020-08-09 (×2): 25 ug via INTRAVENOUS
  Filled 2020-08-06: qty 25

## 2020-08-06 MED ORDER — SODIUM BICARBONATE 8.4 % IV SOLN
INTRAVENOUS | Status: AC
  Administered 2020-08-06: 50 meq
  Filled 2020-08-06: qty 100

## 2020-08-06 MED ORDER — SODIUM CHLORIDE 0.9 % IV SOLN
250.0000 mL | INTRAVENOUS | Status: DC
  Administered 2020-08-06 – 2020-08-08 (×2): 250 mL via INTRAVENOUS

## 2020-08-06 MED ORDER — POLYETHYLENE GLYCOL 3350 17 G PO PACK
17.0000 g | PACK | Freq: Every day | ORAL | Status: DC
  Administered 2020-08-06 – 2020-08-07 (×2): 17 g via ORAL
  Filled 2020-08-06 (×2): qty 1

## 2020-08-06 MED ORDER — CHLORHEXIDINE GLUCONATE 0.12% ORAL RINSE (MEDLINE KIT)
15.0000 mL | Freq: Two times a day (BID) | OROMUCOSAL | Status: DC
  Administered 2020-08-06 – 2020-08-10 (×9): 15 mL via OROMUCOSAL

## 2020-08-06 MED ORDER — MIDAZOLAM HCL 2 MG/2ML IJ SOLN
INTRAMUSCULAR | Status: AC
  Filled 2020-08-06: qty 2

## 2020-08-06 MED ORDER — NOREPINEPHRINE 4 MG/250ML-% IV SOLN: 0.0000 ug/min | INTRAVENOUS | Status: DC

## 2020-08-06 MED ORDER — DOCUSATE SODIUM 50 MG/5ML PO LIQD
100.0000 mg | Freq: Two times a day (BID) | ORAL | Status: DC
  Administered 2020-08-06 – 2020-08-07 (×3): 100 mg via ORAL
  Filled 2020-08-06 (×3): qty 10

## 2020-08-06 MED ORDER — VANCOMYCIN HCL 2000 MG/400ML IV SOLN
2000.0000 mg | Freq: Once | INTRAVENOUS | Status: DC
  Administered 2020-08-06: 2000 mg via INTRAVENOUS
  Filled 2020-08-06: qty 400

## 2020-08-06 MED ORDER — CLEVIDIPINE BUTYRATE 0.5 MG/ML IV EMUL
0.0000 mg/h | INTRAVENOUS | Status: DC
  Administered 2020-08-06: 1 mg/h via INTRAVENOUS
  Filled 2020-08-06 (×2): qty 50

## 2020-08-06 MED ORDER — ROCURONIUM BROMIDE 10 MG/ML (PF) SYRINGE
PREFILLED_SYRINGE | INTRAVENOUS | Status: AC
  Administered 2020-08-06: 100 mg
  Filled 2020-08-06: qty 10

## 2020-08-06 MED ORDER — ORAL CARE MOUTH RINSE
15.0000 mL | OROMUCOSAL | Status: DC
  Administered 2020-08-06 – 2020-08-10 (×38): 15 mL via OROMUCOSAL

## 2020-08-06 MED ORDER — FENTANYL 2500MCG IN NS 250ML (10MCG/ML) PREMIX INFUSION
25.0000 ug/h | INTRAVENOUS | Status: DC
  Administered 2020-08-06: 150 ug/h via INTRAVENOUS
  Administered 2020-08-06: 50 ug/h via INTRAVENOUS
  Administered 2020-08-07: 200 ug/h via INTRAVENOUS
  Administered 2020-08-08: 175 ug/h via INTRAVENOUS
  Administered 2020-08-08: 125 ug/h via INTRAVENOUS
  Administered 2020-08-09: 75 ug/h via INTRAVENOUS
  Filled 2020-08-06 (×6): qty 250

## 2020-08-06 MED ORDER — NOREPINEPHRINE 4 MG/250ML-% IV SOLN
INTRAVENOUS | Status: AC
  Administered 2020-08-06: 4 mg
  Filled 2020-08-06: qty 250

## 2020-08-06 MED ORDER — PANTOPRAZOLE SODIUM 40 MG IV SOLR
40.0000 mg | Freq: Every day | INTRAVENOUS | Status: DC
  Administered 2020-08-06 – 2020-08-10 (×5): 40 mg via INTRAVENOUS
  Filled 2020-08-06 (×6): qty 40

## 2020-08-06 MED ORDER — NOREPINEPHRINE 4 MG/250ML-% IV SOLN
2.0000 ug/min | INTRAVENOUS | Status: DC
  Administered 2020-08-06: 9 ug/min via INTRAVENOUS
  Administered 2020-08-08: 7 ug/min via INTRAVENOUS
  Administered 2020-08-08: 5.5 ug/min via INTRAVENOUS
  Administered 2020-08-09: 4.5 ug/min via INTRAVENOUS
  Administered 2020-08-10: 1 ug/min via INTRAVENOUS
  Filled 2020-08-06 (×6): qty 250

## 2020-08-06 MED ORDER — VANCOMYCIN HCL IN DEXTROSE 1-5 GM/200ML-% IV SOLN: 1000.0000 mg | INTRAVENOUS | Status: DC

## 2020-08-06 MED ORDER — IPRATROPIUM-ALBUTEROL 0.5-2.5 (3) MG/3ML IN SOLN
RESPIRATORY_TRACT | Status: AC
  Administered 2020-08-06: 3 mL via RESPIRATORY_TRACT
  Filled 2020-08-06: qty 3

## 2020-08-06 MED ORDER — HYDRALAZINE HCL 20 MG/ML IJ SOLN
INTRAMUSCULAR | Status: AC
  Filled 2020-08-06: qty 1

## 2020-08-06 MED ORDER — ETOMIDATE 2 MG/ML IV SOLN: 15.0000 mg | Freq: Once | INTRAVENOUS | Status: AC

## 2020-08-06 MED ORDER — IPRATROPIUM-ALBUTEROL 0.5-2.5 (3) MG/3ML IN SOLN: 3.0000 mL | Freq: Four times a day (QID) | RESPIRATORY_TRACT | Status: DC | PRN

## 2020-08-06 MED ORDER — INSULIN ASPART 100 UNIT/ML ~~LOC~~ SOLN
0.0000 [IU] | SUBCUTANEOUS | Status: DC
  Administered 2020-08-06 – 2020-08-09 (×8): 1 [IU] via SUBCUTANEOUS
  Administered 2020-08-09: 2 [IU] via SUBCUTANEOUS
  Administered 2020-08-09: 1 [IU] via SUBCUTANEOUS
  Administered 2020-08-09: 2 [IU] via SUBCUTANEOUS
  Administered 2020-08-10 – 2020-08-12 (×3): 1 [IU] via SUBCUTANEOUS
  Administered 2020-08-12: 2 [IU] via SUBCUTANEOUS
  Administered 2020-08-13 (×2): 1 [IU] via SUBCUTANEOUS
  Administered 2020-08-13: 2 [IU] via SUBCUTANEOUS
  Administered 2020-08-14: 1 [IU] via SUBCUTANEOUS
  Administered 2020-08-14: 2 [IU] via SUBCUTANEOUS
  Administered 2020-08-15: 1 [IU] via SUBCUTANEOUS
  Administered 2020-08-15: 5 [IU] via SUBCUTANEOUS
  Administered 2020-08-16 (×4): 1 [IU] via SUBCUTANEOUS
  Administered 2020-08-16: 2 [IU] via SUBCUTANEOUS
  Administered 2020-08-16 – 2020-08-17 (×2): 1 [IU] via SUBCUTANEOUS
  Administered 2020-08-17: 3 [IU] via SUBCUTANEOUS
  Administered 2020-08-17 (×3): 2 [IU] via SUBCUTANEOUS

## 2020-08-06 NOTE — Progress Notes (Signed)
ANTICOAGULATION CONSULT NOTE - Follow Up Consult  Pharmacy Consult for Heparin Indication: pulmonary embolus  No Known Allergies  Patient Measurements: Height: 5\' 5"  (165.1 cm) Weight: 100.4 kg (221 lb 7.2 oz) IBW/kg (Calculated) : 57 Heparin Dosing Weight: 68kg  Vital Signs: Temp: 98.2 F (36.8 C) (09/05 2000) Temp Source: Bladder (09/05 2000) BP: 101/53 (09/05 2000) Pulse Rate: 63 (09/05 2000)  Labs: Recent Labs    08/05/20 1505 08/05/20 1700 08/06/20 0354 08/06/20 0515 08/06/20 0515 08/06/20 0700 08/06/20 0701 08/06/20 1957  HGB 16.8*  --    < > 20.1*   < >  --  17.4* 16.6*  HCT 59.5*  --    < > 59.0*  --   --  61.0* 56.3*  PLT 186  --   --   --   --   --  135* 158  APTT  --  37*  --   --   --   --   --   --   LABPROT  --  13.5  --   --   --   --   --   --   INR  --  1.1  --   --   --   --   --   --   HEPARINUNFRC  --   --   --   --   --  0.35  --  0.65  CREATININE 1.66*  --   --   --   --   --  1.47*  --   TROPONINIHS 19*  --   --   --   --   --   --   --    < > = values in this interval not displayed.    Estimated Creatinine Clearance: 38.2 mL/min (A) (by C-G formula based on SCr of 1.47 mg/dL (H)).   Medications:  Infusions:  . sodium chloride 10 mL/hr at 08/06/20 2000  . ceFEPime (MAXIPIME) IV    . fentaNYL infusion INTRAVENOUS 150 mcg/hr (08/06/20 2000)  . heparin 1,150 Units/hr (08/06/20 2000)  . norepinephrine (LEVOPHED) Adult infusion 6 mcg/min (08/06/20 2000)  . propofol (DIPRIVAN) infusion 15 mcg/kg/min (08/06/20 2000)    Assessment: 74 years of age who presented to the ED with shortness of breath and found to have small pulmonary embolism.  -heparin level= 0.65, hg= 16.6, plt= 158   Goal of Therapy:  Heparin level 0.3-0.7 units/ml Monitor platelets by anticoagulation protocol: Yes   Plan:  No heparin changes needed Daily Heparin level and CBC while on therapy.   71, PharmD Clinical Pharmacist **Pharmacist phone directory  can now be found on [email or web address] ([email or web address]).  Listed under [facility].

## 2020-08-06 NOTE — Progress Notes (Signed)
VASCULAR LAB    Bilateral lower extremity venous duplex completed.    Preliminary report:  See CV proc for preliminary results.  Ziare Orrick, RVT 08/06/2020, 1:42 PM

## 2020-08-06 NOTE — Procedures (Signed)
Intubation Procedure Note  CYRENE GHARIBIAN  [id number]  [birth year]  Date:08/06/20  Time:3:58 AM   Provider Performing:Alessia Gonsalez R Keval Nam    Procedure: Intubation (31500)  Indication(s) Respiratory Failure  Consent Risks of the procedure as well as the alternatives and risks of each were explained to the patient and/or caregiver.  Consent for the procedure was obtained and is signed in the bedside chart   Anesthesia Etomidate, Fentanyl and Rocuronium   Time Out Verified patient identification, verified procedure, site/side was marked, verified correct patient position, special equipment/implants available, medications/allergies/relevant history reviewed, required imaging and test results available.   Sterile Technique Usual hand hygeine, masks, and gloves were used   Procedure Description Patient positioned in bed supine.  Sedation given as noted above.  Patient was intubated with endotracheal tube using Glidescope.  View was Grade 2 only posterior commissure .  Number of attempts was 1.  Colorimetric CO2 detector was consistent with tracheal placement.   Complications/Tolerance None; patient tolerated the procedure well. Chest X-ray is ordered to verify placement.   EBL Minimal   Specimen(s) None   Darcella Gasman Shuan Statzer, PA-C

## 2020-08-06 NOTE — Progress Notes (Signed)
ANTICOAGULATION CONSULT NOTE - Follow Up Consult  Pharmacy Consult for Heparin Indication: pulmonary embolus  No Known Allergies  Patient Measurements: Height: 5\' 5"  (165.1 cm) Weight: (P) 100.4 kg (221 lb 7.2 oz) IBW/kg (Calculated) : 57 Heparin Dosing Weight: 68kg  Vital Signs: Temp: 95.9 F (35.5 C) (09/05 0715) Temp Source: Bladder (09/05 0715) BP: 81/57 (09/05 0715) Pulse Rate: 65 (09/05 0715)  Labs: Recent Labs    08/05/20 1505 08/05/20 1505 08/05/20 1700 08/06/20 0354 08/06/20 0515 08/06/20 0700  HGB 16.8*   < >  --  18.0* 20.1*  --   HCT 59.5*  --   --  53.0* 59.0*  --   PLT 186  --   --   --   --   --   APTT  --   --  37*  --   --   --   LABPROT  --   --  13.5  --   --   --   INR  --   --  1.1  --   --   --   HEPARINUNFRC  --   --   --   --   --  0.35  CREATININE 1.66*  --   --   --   --   --   TROPONINIHS 19*  --   --   --   --   --    < > = values in this interval not displayed.    Estimated Creatinine Clearance: 25.9 mL/min (A) (by C-G formula based on SCr of 1.66 mg/dL (H)).   Medications:  Infusions:  . [START ON 08/07/2020] ceFEPime (MAXIPIME) IV    . ceFEPime (MAXIPIME) IV    . clevidipine 1 mg/hr (08/06/20 0700)  . fentaNYL infusion INTRAVENOUS 100 mcg/hr (08/06/20 0700)  . heparin 1,100 Units/hr (08/06/20 0700)  . norepinephrine (LEVOPHED) Adult infusion 4 mg (08/06/20 0734)  . propofol (DIPRIVAN) infusion 5 mcg/kg/min (08/06/20 0700)  . [START ON 08/08/2020] vancomycin    . vancomycin 200 mL/hr at 08/06/20 0700    Assessment: 74 years of age who presented to the ED with shortness of breath and found to have small pulmonary embolism.   Heparin level is therapeutic at the low end of range (0.35) on 1100 units/hr. H/H is elevated. Platelets are down from 186 to 135- will need to watch closely. D-dimer >20. Fibrinogen 441. Trop is 19. Touched based with RN - prior RN noted some minor vaginal bleeding, none present now. Will increase slightly to  keep in range and monitor. RN aware of plan and to notify pharmacist if any bleeding.   Goal of Therapy:  Heparin level 0.3-0.7 units/ml Monitor platelets by anticoagulation protocol: Yes   Plan:  Increase Heparin to 1150 units/hr to keep in range.  Heparin level in 6 hours to confirm.  Will repeat CBC at that time with platelet concern Daily Heparin level and CBC while on therapy.   71, PharmD, BCPS, BCCCP Clinical Pharmacist Please refer to [facility] for [facility] Pharmacy numbers 08/06/2020,7:31 AM

## 2020-08-06 NOTE — Consult Note (Signed)
NAME:  Miranda Cohen, MRN:  [id number], DOB:  1946/06/01, LOS: 1 ADMISSION DATE:  08/05/2020, CONSULTATION DATE:  08/06/20 REFERRING MD:  Julian Reil, CHIEF COMPLAINT:  Hypercapnic respiratory failure   Brief History   74 y.o. F with PMH of tobacco use who presented with exertional shortness of breath, cough, diarrhea, fatigue since 8/1.  Work-up significant for small PE, hypercarbia, likely COPD exacerbation.  Bipap was initiated, however hypercarbia failed to significantly improve and pt became more lethargic so PCCM consulted  History of present illness   Miranda Cohen is a 74 y.o. F with PMH of prolonged tobacco use, states has been smoking 1ppd for most of her life, who began feeling more dyspneic on exertion approximately one month ago. Over the last few days, she also developed diarrhea without nausea and vomiting  She presented to the ED several weeks ago, but left AMA.  She denied fevers, chills or significant leg edema  EMS reportedly found patient with oxygen sats in the 50%'s on RA, she was brought to the ED and placed on nasal cannula initially.  CXR with LLL infiltrate vs atelectasis, Covid-19 negative, BNP 900, Procal negative. CT chest with small RLL PE, cardiomegaly and moderate pericardial effusion, evidence of PAH and COPD.   ABG results pH 7.1, pCO2 115, pO2 87 bicarb 35.  She was admitted to the hospitalists and started on Bipap, heparin, and inhalers. Pt's repeat ABG showed no significant improvement and she remained lethargic, so PCCM consulted.  Past Medical History  Tobacco abuse  Significant Hospital Events   9/4 Admit to Hospitalists, ICU txfr  Consults:  PCCM  Procedures:  9/5 ETT  Significant Diagnostic Tests:  9/4 CXR>>The lower portion of the left lung and left cardiac border are obscured by dense consolidation or pleural effusion. 9/4 CT chest>>small RLL PE, cardiomegalu and moderate pericardial effusion  Micro Data:  9/4 Sars-CoV-2>>negative 9/4  BCx2>> 9/4 MRSA screen>> 9/4 repeat covid>> 9/5 BCx2>> 9/5 Respiratory culture  Antimicrobials:    Interim history/subjective:  Pt transferred to the ICU and intubated  Objective   Blood pressure 113/61, pulse 62, temperature 97.7 F (36.5 C), temperature source Axillary, resp. rate 19, height 5\' 5"  (1.651 m), weight 68 kg, SpO2 (!) 89 %.       No intake or output data in the 24 hours ending 08/06/20 0213 Filed Weights   08/05/20 1520  Weight: 68 kg    General:  Elderly, chronically ill-appearing F somnolent on bipap HEENT: MM pink/moist Neuro: minimally arousable, opens eyes but not answering questionos CV: s1s2 rrr, no m/r/g PULM:  Diminished bilaterally  GI: soft, bsx4 active  Extremities: warm/dry, 1+ edema  Skin: no rashes or lesions   Resolved Hospital Problem list     Assessment & Plan:   Acute Hypoxic and Hypercarbic Respiratory Failure  Imposed on likely undiagnosed COPD, possible PAH/ heart failure and atelectesis vs infiltrate -Trial of Bipap failed to significantly improve ventilation and acidosis, transferred to intensive care and intubated -Continue daily prednisone and scheduled duonebs -obtain sputum and blood cultures and start Vancomycin and Cefepime\ --Maintain full vent support with SAT/SBT as tolerated -titrate Vent setting to maintain SpO2 greater than or equal to 90%. -HOB elevated 30 degrees. -Plateau pressures less than 30 cm H20.  -Follow chest x-ray, ABG prn.   -Bronchial hygiene and RT/bronchodilator protocol.    Small, RLL PE -too small to cause R heart strain, echo pending -continue heparin gtt  -LE dopplers pending  Pericardial effusion Echo pending,  no evidence of tamponade   Renal Insufficiency No baseline in Epic -follow renal indices, UOP and electrolytes   Best practice:  Diet: NPO Pain/Anxiety/Delirium protocol (if indicated): fentanyl gtt VAP protocol (if indicated): HOB 30 degrees, suction prn DVT  prophylaxis: heparin GI prophylaxis: protonix Glucose control: SII Mobility: bed rest Code Status: full code Family Communication: family updated Disposition: ICU  Labs   CBC: Recent Labs  Lab 08/05/20 1505  WBC 5.6  NEUTROABS 3.8  HGB 16.8*  HCT 59.5*  MCV 98.5  PLT 186    Basic Metabolic Panel: Recent Labs  Lab 08/05/20 1505  NA 136  K 5.0  CL 94*  CO2 33*  GLUCOSE 95  BUN 30*  CREATININE 1.66*  CALCIUM 8.9   GFR: Estimated Creatinine Clearance: 25.9 mL/min (A) (by C-G formula based on SCr of 1.66 mg/dL (H)). Recent Labs  Lab 08/05/20 1505 08/05/20 1555  PROCALCITON <0.10  --   WBC 5.6  --   LATICACIDVEN  --  1.3    Liver Function Tests: Recent Labs  Lab 08/05/20 1505  AST 14*  ALT 12  ALKPHOS 66  BILITOT 1.3*  PROT 6.7  ALBUMIN 3.3*   No results for input(s): LIPASE, AMYLASE in the last 168 hours. No results for input(s): AMMONIA in the last 168 hours.  ABG    Component Value Date/Time   PHART 7.129 (LL) 08/06/2020 0051   PCO2ART 112 (HH) 08/06/2020 0051   PO2ART 54.6 (L) 08/06/2020 0051   HCO3 36.2 (H) 08/06/2020 0051   O2SAT 85.5 08/06/2020 0051     Coagulation Profile: Recent Labs  Lab 08/05/20 1700  INR 1.1    Cardiac Enzymes: No results for input(s): CKTOTAL, CKMB, CKMBINDEX, TROPONINI in the last 168 hours.  HbA1C: No results found for: HGBA1C  CBG: Recent Labs  Lab 08/06/20 0054  GLUCAP 106*    Review of Systems:   Unable to obtain secondary to AMS  Past Medical History  She,  has no past medical history on file.   Surgical History   History reviewed. No pertinent surgical history.   Social History   reports that she has been smoking cigarettes. She has been smoking about 1.50 packs per day. She does not have any smokeless tobacco history on file. She reports that she does not drink alcohol and does not use drugs.   Family History   Her family history is not on file.   Allergies No Known Allergies    Home Medications  Prior to Admission medications   Medication Sig Start Date End Date Taking? Authorizing Provider  acetaminophen (TYLENOL) 500 MG tablet Take 1,000 mg by mouth every 6 (six) hours as needed for headache (pain).   Yes [provider]     Critical care time: 45 minutes     CRITICAL CARE Performed by: Darcella Gasman Prabhav Faulkenberry   Total critical care time: 45 minutes  Critical care time was exclusive of separately billable procedures and treating other patients.  Critical care was necessary to treat or prevent imminent or life-threatening deterioration.  Critical care was time spent personally by me on the following activities: development of treatment plan with patient and/or surrogate as well as nursing, discussions with consultants, evaluation of patient's response to treatment, examination of patient, obtaining history from patient or surrogate, ordering and performing treatments and interventions, ordering and review of laboratory studies, ordering and review of radiographic studies, pulse oximetry and re-evaluation of patient's condition.    Darcella Gasman Cheyann Blecha, PA-C

## 2020-08-06 NOTE — Progress Notes (Signed)
Pharmacy Antibiotic Note  Miranda Cohen is a 74 y.o. female admitted on 08/05/2020 with sepsis.  Pharmacy has been consulted for vancomycin and cefepime dosing.  Plan: Vancomycin 2gm IV x 1 then 1gm IV q48 hours Cefepime 2gm IV x 1 then 1gm IV q24 hours F/u renal function, cultures and clinical course  Height: 5\' 5"  (165.1 cm) Weight: (P) 100.4 kg (221 lb 7.2 oz) IBW/kg (Calculated) : 57  Temp (24hrs), Avg:97.7 F (36.5 C), Min:97.6 F (36.4 C), Max:97.7 F (36.5 C)  Recent Labs  Lab 08/05/20 1505 08/05/20 1555  WBC 5.6  --   CREATININE 1.66*  --   LATICACIDVEN  --  1.3    Estimated Creatinine Clearance: 25.9 mL/min (A) (by C-G formula based on SCr of 1.66 mg/dL (H)).    No Known Allergies   Thank you for allowing pharmacy to be a part of this patient's care.  10/05/20 [place] 08/06/2020 4:31 AM

## 2020-08-06 NOTE — Procedures (Signed)
Arterial Catheter Insertion Procedure Note  CAROLEANN CASLER  [id number]  [birth year]  Date:08/06/20  Time:5:44 AM    Provider Performing: Charlotte Sanes    Procedure: Insertion of Arterial Line (37106) with US guidance (26948)   Indication(s) Blood pressure monitoring and/or need for frequent ABGs  Consent Unable to obtain consent due to emergent nature of procedure.  Anesthesia lidocaine 1%, 4cc   Time Out Verified patient identification, verified procedure, site/side was marked, verified correct patient position, special equipment/implants available, medications/allergies/relevant history reviewed, required imaging and test results available.   Sterile Technique Maximal sterile technique including full sterile barrier drape, hand hygiene, sterile gown, sterile gloves, mask, hair covering, sterile ultrasound probe cover (if used).   Procedure Description Area of catheter insertion was cleaned with chlorhexidine and draped in sterile fashion.   Initial attempt, accessed artery with needle, unable to thread wire.  Second attempt with assistance threading wire by Vernona Rieger Gleason while I held the needle.  Threaded easily, placed catheter over wire easily. With real-time ultrasound guidance an arterial catheter was placed into the right femoral artery. Oddly was only minimally pulsatile blood flow.   Appropriate arterial tracings confirmed on monitor.  Patient actually found to be hypertensive 200/90s.     Complications/Tolerance None; patient tolerated the procedure well.   EBL 20cc   Specimen(s) None

## 2020-08-06 NOTE — Progress Notes (Signed)
NAME:  Miranda Cohen, MRN:  [id number], DOB:  Apr 23, 1946, LOS: 1 ADMISSION DATE:  08/05/2020, CONSULTATION DATE:  08/06/20 REFERRING MD:  Julian Reil, CHIEF COMPLAINT:  Hypercapnic respiratory failure   Brief History   74 y.o. F with PMH of tobacco use who presented with exertional shortness of breath, cough, diarrhea, fatigue since 8/1.  Work-up significant for small PE, hypercarbia, likely COPD exacerbation.  Bipap was initiated, however hypercarbia failed to significantly improve and pt became more lethargic so PCCM consulted  History of present illness   Miranda Cohen is a 74 y.o. F with PMH of prolonged tobacco use, states has been smoking 1ppd for most of her life, who began feeling more dyspneic on exertion approximately one month ago. Over the last few days, she also developed diarrhea without nausea and vomiting  She presented to the ED several weeks ago, but left AMA.  She denied fevers, chills or significant leg edema  EMS reportedly found patient with oxygen sats in the 50%'s on RA, she was brought to the ED and placed on nasal cannula initially.  CXR with LLL infiltrate vs atelectasis, Covid-19 negative, BNP 900, Procal negative. CT chest with small RLL PE, cardiomegaly and moderate pericardial effusion, evidence of PAH and COPD.   ABG results pH 7.1, pCO2 115, pO2 87 bicarb 35.  She was admitted to the hospitalists and started on Bipap, heparin, and inhalers. Pt's repeat ABG showed no significant improvement and she remained lethargic, so PCCM consulted.  Past Medical History  Tobacco abuse  Significant Hospital Events   9/4 Admit to Hospitalists, ICU txfr  Consults:  PCCM  Procedures:  9/5 ETT 9/5 Right femoral artery cathter  Significant Diagnostic Tests:  9/4 CXR>>The lower portion of the left lung and left cardiac border are obscured by dense consolidation or pleural effusion. 9/4 CT chest>>small RLL PE, cardiomegalu and moderate pericardial effusion  Micro Data:  9/4  Sars-CoV-2>>negative 9/4 BCx2>> 9/4 MRSA screen>>negative 9/4 repeat covid>>negative 9/5 BCx2>> 9/5 Respiratory culture  Antimicrobials:  9/5 Cefepime>> 9/5 Vancomycin>9/5  Interim history/subjective:  No acute events since transferring to the ICU. Remains sedated and on the ventilator. Is currently requiring levophed support.   Objective   Blood pressure (!) 81/57, pulse 65, temperature (!) 95.9 F (35.5 C), temperature source Bladder, resp. rate 14, height 5\' 5"  (1.651 m), weight (P) 100.4 kg, SpO2 91 %.    Vent Mode: PRVC FiO2 (%):  [70 %-100 %] 90 % Set Rate:  [14 bmp-20 bmp] 14 bmp Vt Set:  [450 mL] 450 mL PEEP:  [8 cmH20] 8 cmH20 Plateau Pressure:  [24 cmH20] 24 cmH20   Intake/Output Summary (Last 24 hours) at 08/06/2020 0812 Last data filed at 08/06/2020 0700 Gross per 24 hour  Intake 273.82 ml  Output 350 ml  Net -76.18 ml   Filed Weights   08/05/20 1520 08/06/20 0250  Weight: 68 kg (P) 100.4 kg    General:  Elderly, chronically ill-appearing, sedated, no acute distress HEENT: MM pink/moist Neuro: sedated, PERRL CV: s1s2 rrr, no m/r/g PULM:  Scattered rhonchi and course breath sounds bilaterally. No wheezing GI: soft, bsx4 active  Extremities: warm/dry, 1+ edema  Skin: no rashes or lesions   Resolved Hospital Problem list     Assessment & Plan:   Acute Hypoxemic and Hypercapnic Respiratory Failure  In setting of likely undiagnosed COPD, possible PAH/ heart failure and atelectesis vs infiltrate - Maintain full vent support with SAT/SBT as tolerated - titrate Vent setting to maintain SpO2  greater than or equal to 90%. - HOB elevated 30 degrees. - Plateau pressures less than 30 cm H20.  - Follow chest x-ray, ABG prn.   - Bronchial hygiene and RT/bronchodilator protocol. - Continue daily prednisone and scheduled duonebs - Follow up sputum and blood cultures. Continue Cefepime. Vancomycin discontinued with negative MRSA screen.  Small, RLL PE -too small  to cause R heart strain, echo pending -continue heparin gtt  -LE dopplers pending  Pericardial effusion Echo pending, no evidence of tamponade  Renal Insufficiency No baseline in Epic -follow renal indices, UOP and electrolytes  Polychythemia Likely secondary to chronic hypoxemia from underlying obstructive lung disease - Monitor  Best practice:  Diet: NPO Pain/Anxiety/Delirium protocol (if indicated): fentanyl gtt VAP protocol (if indicated): HOB 30 degrees, suction prn DVT prophylaxis: heparin GI prophylaxis: protonix Glucose control: SII Mobility: bed rest Code Status: full code Family Communication: family updated Disposition: ICU  Labs   CBC: Recent Labs  Lab 08/05/20 1505 08/06/20 0354 08/06/20 0515 08/06/20 0701  WBC 5.6  --   --  7.5  NEUTROABS 3.8  --   --   --   HGB 16.8* 18.0* 20.1* 17.4*  HCT 59.5* 53.0* 59.0* 61.0*  MCV 98.5  --   --  97.8  PLT 186  --   --  135*    Basic Metabolic Panel: Recent Labs  Lab 08/05/20 1505 08/06/20 0354 08/06/20 0515 08/06/20 0701  NA 136 143 135 138  K 5.0 4.9 5.0 4.7  CL 94*  --   --  95*  CO2 33*  --   --  30  GLUCOSE 95  --   --  158*  BUN 30*  --   --  28*  CREATININE 1.66*  --   --  1.47*  CALCIUM 8.9  --   --  8.7*   GFR: Estimated Creatinine Clearance: 29.3 mL/min (A) (by C-G formula based on SCr of 1.47 mg/dL (H)). Recent Labs  Lab 08/05/20 1505 08/05/20 1555 08/06/20 0701  PROCALCITON <0.10  --   --   WBC 5.6  --  7.5  LATICACIDVEN  --  1.3  --     Liver Function Tests: Recent Labs  Lab 08/05/20 1505  AST 14*  ALT 12  ALKPHOS 66  BILITOT 1.3*  PROT 6.7  ALBUMIN 3.3*   No results for input(s): LIPASE, AMYLASE in the last 168 hours. No results for input(s): AMMONIA in the last 168 hours.  ABG    Component Value Date/Time   PHART 7.516 (H) 08/06/2020 0515   PCO2ART 42.6 08/06/2020 0515   PO2ART 87 08/06/2020 0515   HCO3 35.2 (H) 08/06/2020 0515   TCO2 37 (H) 08/06/2020 0515    O2SAT 98.0 08/06/2020 0515     Coagulation Profile: Recent Labs  Lab 08/05/20 1700  INR 1.1    Cardiac Enzymes: No results for input(s): CKTOTAL, CKMB, CKMBINDEX, TROPONINI in the last 168 hours.  HbA1C: Hgb A1c MFr Bld  Date/Time Value Ref Range Status  08/06/2020 07:00 AM 6.0 (H) 4.8 - 5.6 % Final    Comment:    (NOTE) Pre diabetes:          5.7%-6.4%  Diabetes:              >6.4%  Glycemic control for   <7.0% adults with diabetes     CBG: Recent Labs  Lab 08/06/20 0054 08/06/20 0300 08/06/20 0728  GLUCAP 106* 111* 138*    Critical care time:  35 minutes     CRITICAL CARE Performed by: Martina Sinner   Total critical care time: 35 minutes  Critical care time was exclusive of separately billable procedures and treating other patients.  Critical care was necessary to treat or prevent imminent or life-threatening deterioration.  Critical care was time spent personally by me on the following activities: development of treatment plan with patient and/or surrogate as well as nursing, discussions with consultants, evaluation of patient's response to treatment, examination of patient, obtaining history from patient or surrogate, ordering and performing treatments and interventions, ordering and review of laboratory studies, ordering and review of radiographic studies, pulse oximetry and re-evaluation of patient's condition.    Martina Sinner, MD

## 2020-08-06 NOTE — Progress Notes (Signed)
eLink Physician-Brief Progress Note Patient Name: Miranda Cohen DOB: 28-Feb-1946 MRN: [id number]   Date of Service  08/06/2020  HPI/Events of Note  Patient with a history of chronic respiratory failure transferred from the floor to the unit earlier tonight secondary to severe hypoxemic, hypercapnic respiratory failure with impending respiratory arrest. She is about to be intubated.  eICU Interventions  New Patient Evaluation completed        Migdalia Dk 08/06/2020, 3:17 AM

## 2020-08-06 NOTE — Progress Notes (Signed)
  Echocardiogram 2D Echocardiogram has been performed.  Miranda Cohen 08/06/2020, 11:36 AM

## 2020-08-06 NOTE — Progress Notes (Signed)
Pharmacy Antibiotic Note  Miranda Cohen is a 74 y.o. female admitted on 08/05/2020 with sepsis.  Pharmacy has been consulted for cefepime dosing. Vancomycin discontinued with negative MRSA PCR.   SCr improving. Normalized CrCl up to 37 mL/min.  Cultures remain in process - no growth to date.   Plan: Increase Cefepime to 2g IV every 12 hours.  F/u renal function, cultures and clinical course  Height: 5\' 5"  (165.1 cm) Weight: 100.4 kg (221 lb 7.2 oz) IBW/kg (Calculated) : 57  Temp (24hrs), Avg:95.7 F (35.4 C), Min:93.4 F (34.1 C), Max:97.7 F (36.5 C)  Recent Labs  Lab 08/05/20 1505 08/05/20 1555 08/06/20 0701  WBC 5.6  --  7.5  CREATININE 1.66*  --  1.47*  LATICACIDVEN  --  1.3  --     Estimated Creatinine Clearance: 38.2 mL/min (A) (by C-G formula based on SCr of 1.47 mg/dL (H)).    No Known Allergies   Thank you for allowing pharmacy to be a part of this patients care.  10/06/20 08/06/2020 10:05 AM

## 2020-08-06 NOTE — Significant Event (Signed)
Rapid Response Event Note   Reason for Call : Called to bedside d/t pt being difficult to arouse.   Initial Focused Assessment: Pt on Bipap 70% FiO2 SpO2 89%, RR 19,arousable to repeated stimuli, breath sounds diminished bilaterally. RT also at bedside.    Interventions:  ABG- 7.1/112/54.6/36.2  Plan of Care:  Consult CCM  TX to [id number]  Pt intubated   End Time: 0330  Zenaida Niece, RN

## 2020-08-06 NOTE — Progress Notes (Signed)
PT Cancellation Note  Patient Details Name: Miranda Cohen MRN: [id number] DOB: 1946-11-17   Cancelled Treatment:    Reason Eval/Treat Not Completed: Patient not medically ready (pt with respiratory decline and intubation this morning with Fio2 90%. Pt not currently appropriate. will sign off and await new order)   [name] [name] 08/06/2020, 7:22 AM  [name], PT Acute Rehabilitation Services Pager: [phone] Office: [phone]

## 2020-08-06 NOTE — Progress Notes (Signed)
Patient transferred safely to [facility]. Report given to admitting nurse. Patient belonging bag (1) transported with patient. Informed emergency contact, [name] Soloman, of transfer. Provided contact information for [facility] and answered questions to [name]'s satisfaction.

## 2020-08-07 ENCOUNTER — Inpatient Hospital Stay (HOSPITAL_COMMUNITY): Payer: Medicare Other

## 2020-08-07 DIAGNOSIS — I2584 Coronary atherosclerosis due to calcified coronary lesion: Secondary | ICD-10-CM

## 2020-08-07 DIAGNOSIS — N289 Disorder of kidney and ureter, unspecified: Secondary | ICD-10-CM

## 2020-08-07 DIAGNOSIS — I2510 Atherosclerotic heart disease of native coronary artery without angina pectoris: Secondary | ICD-10-CM

## 2020-08-07 LAB — HEPARIN LEVEL (UNFRACTIONATED): Heparin Unfractionated: 0.71 IU/mL — ABNORMAL HIGH (ref 0.30–0.70)

## 2020-08-07 LAB — POCT I-STAT 7, (LYTES, BLD GAS, ICA,H+H)
Acid-Base Excess: 5 mmol/L — ABNORMAL HIGH (ref 0.0–2.0)
Acid-Base Excess: 5 mmol/L — ABNORMAL HIGH (ref 0.0–2.0)
Bicarbonate: 31.5 mmol/L — ABNORMAL HIGH (ref 20.0–28.0)
Bicarbonate: 31.1 mmol/L — ABNORMAL HIGH (ref 20.0–28.0)
Calcium, Ion: 1.09 mmol/L — ABNORMAL LOW (ref 1.15–1.40)
Calcium, Ion: 1.11 mmol/L — ABNORMAL LOW (ref 1.15–1.40)
HCT: 52 % — ABNORMAL HIGH (ref 36.0–46.0)
HCT: 52 % — ABNORMAL HIGH (ref 36.0–46.0)
Hemoglobin: 17.7 g/dL — ABNORMAL HIGH (ref 12.0–15.0)
Hemoglobin: 17.7 g/dL — ABNORMAL HIGH (ref 12.0–15.0)
O2 Saturation: 83 %
O2 Saturation: 90 %
Patient temperature: 97.34
Patient temperature: 36.4
Potassium: 4.8 mmol/L (ref 3.5–5.1)
Potassium: 5 mmol/L (ref 3.5–5.1)
Sodium: 137 mmol/L (ref 135–145)
Sodium: 138 mmol/L (ref 135–145)
TCO2: 33 mmol/L — ABNORMAL HIGH (ref 22–32)
TCO2: 33 mmol/L — ABNORMAL HIGH (ref 22–32)
pCO2 arterial: 51.5 mmHg — ABNORMAL HIGH (ref 32.0–48.0)
pCO2 arterial: 47.2 mmHg (ref 32.0–48.0)
pH, Arterial: 7.391 (ref 7.350–7.450)
pH, Arterial: 7.425 (ref 7.350–7.450)
pO2, Arterial: 47 mmHg — ABNORMAL LOW (ref 83.0–108.0)
pO2, Arterial: 56 mmHg — ABNORMAL LOW (ref 83.0–108.0)

## 2020-08-07 LAB — COMPREHENSIVE METABOLIC PANEL
ALT: 12 U/L (ref 0–44)
AST: 10 U/L — ABNORMAL LOW (ref 15–41)
Albumin: 2.6 g/dL — ABNORMAL LOW (ref 3.5–5.0)
Alkaline Phosphatase: 51 U/L (ref 38–126)
Anion gap: 10 (ref 5–15)
BUN: 37 mg/dL — ABNORMAL HIGH (ref 8–23)
CO2: 28 mmol/L (ref 22–32)
Calcium: 8.2 mg/dL — ABNORMAL LOW (ref 8.9–10.3)
Chloride: 99 mmol/L (ref 98–111)
Creatinine, Ser: 1.9 mg/dL — ABNORMAL HIGH (ref 0.44–1.00)
GFR calc Af Amer: 29 mL/min — ABNORMAL LOW (ref >60)
GFR calc non Af Amer: 25 mL/min — ABNORMAL LOW (ref >60)
Glucose, Bld: 102 mg/dL — ABNORMAL HIGH (ref 70–99)
Potassium: 4.9 mmol/L (ref 3.5–5.1)
Sodium: 137 mmol/L (ref 135–145)
Total Bilirubin: 1.3 mg/dL — ABNORMAL HIGH (ref 0.3–1.2)
Total Protein: 5.7 g/dL — ABNORMAL LOW (ref 6.5–8.1)

## 2020-08-07 LAB — GLUCOSE, CAPILLARY
Glucose-Capillary: 105 mg/dL — ABNORMAL HIGH (ref 70–99)
Glucose-Capillary: 125 mg/dL — ABNORMAL HIGH (ref 70–99)
Glucose-Capillary: 77 mg/dL (ref 70–99)
Glucose-Capillary: 79 mg/dL (ref 70–99)
Glucose-Capillary: 80 mg/dL (ref 70–99)
Glucose-Capillary: 85 mg/dL (ref 70–99)

## 2020-08-07 LAB — CBC
HCT: 53.7 % — ABNORMAL HIGH (ref 36.0–46.0)
Hemoglobin: 15.8 g/dL — ABNORMAL HIGH (ref 12.0–15.0)
MCH: 27.8 pg (ref 26.0–34.0)
MCHC: 29.4 g/dL — ABNORMAL LOW (ref 30.0–36.0)
MCV: 94.4 fL (ref 80.0–100.0)
Platelets: 168 10*3/uL (ref 150–400)
RBC: 5.69 MIL/uL — ABNORMAL HIGH (ref 3.87–5.11)
RDW: 16 % — ABNORMAL HIGH (ref 11.5–15.5)
WBC: 8 10*3/uL (ref 4.0–10.5)
nRBC: 0.3 % — ABNORMAL HIGH (ref 0.0–0.2)

## 2020-08-07 LAB — URINE CULTURE: Culture: NO GROWTH

## 2020-08-07 LAB — TSH: TSH: 63.519 u[IU]/mL — ABNORMAL HIGH (ref 0.350–4.500)

## 2020-08-07 LAB — T4, FREE: Free T4: 0.4 ng/dL — ABNORMAL LOW (ref 0.61–1.12)

## 2020-08-07 LAB — TRIGLYCERIDES: Triglycerides: 138 mg/dL (ref <150)

## 2020-08-07 MED ORDER — POLYETHYLENE GLYCOL 3350 17 G PO PACK
17.0000 g | PACK | Freq: Every day | ORAL | Status: DC
  Administered 2020-08-08 – 2020-08-10 (×3): 17 g
  Filled 2020-08-07 (×4): qty 1

## 2020-08-07 MED ORDER — PREDNISONE 10 MG PO TABS
40.0000 mg | ORAL_TABLET | Freq: Every day | ORAL | Status: DC
  Administered 2020-08-08: 40 mg
  Filled 2020-08-07: qty 4

## 2020-08-07 MED ORDER — DOCUSATE SODIUM 50 MG/5ML PO LIQD
100.0000 mg | Freq: Two times a day (BID) | ORAL | Status: DC
  Administered 2020-08-07 – 2020-08-10 (×6): 100 mg
  Filled 2020-08-07 (×8): qty 10

## 2020-08-07 MED ORDER — IPRATROPIUM-ALBUTEROL 0.5-2.5 (3) MG/3ML IN SOLN
3.0000 mL | Freq: Two times a day (BID) | RESPIRATORY_TRACT | Status: DC
  Administered 2020-08-07 – 2020-08-18 (×20): 3 mL via RESPIRATORY_TRACT
  Filled 2020-08-07 (×21): qty 3

## 2020-08-07 MED ORDER — FUROSEMIDE 10 MG/ML IJ SOLN
40.0000 mg | Freq: Once | INTRAMUSCULAR | Status: AC
  Administered 2020-08-07: 40 mg via INTRAVENOUS
  Filled 2020-08-07: qty 4

## 2020-08-07 MED ORDER — CALCIUM GLUCONATE-NACL 1-0.675 GM/50ML-% IV SOLN
1.0000 g | Freq: Once | INTRAVENOUS | Status: AC
  Administered 2020-08-07: 1000 mg via INTRAVENOUS
  Filled 2020-08-07: qty 50

## 2020-08-07 NOTE — Progress Notes (Signed)
NAME:  Miranda Cohen, MRN:  [id number], DOB:  [birth year], LOS: 2 ADMISSION DATE:  08/05/2020, CONSULTATION DATE:  08/07/20 REFERRING MD:  Julian Reil, CHIEF COMPLAINT:  Hypercapnic respiratory failure   Brief History   74 y.o. F with PMH of tobacco use who presented with exertional shortness of breath, cough, diarrhea, fatigue since 8/1.  Work-up significant for small PE, hypercarbia, likely COPD exacerbation.  Bipap was initiated, however hypercarbia failed to significantly improve and pt became more lethargic so PCCM consulted.  History of present illness   Miranda Cohen is a 74 y.o. F with PMH of prolonged tobacco use, states has been smoking 1ppd for most of her life, who began feeling more dyspneic on exertion approximately one month ago. Over the last few days, she also developed diarrhea without nausea and vomiting  She presented to the ED several weeks ago, but left AMA.  She denied fevers, chills or significant leg edema  EMS reportedly found patient with oxygen sats in the 50%'s on RA, she was brought to the ED and placed on nasal cannula initially.  CXR with LLL infiltrate vs atelectasis, Covid-19 negative, BNP 900, Procal negative. CT chest with small RLL PE, cardiomegaly and moderate pericardial effusion, evidence of PAH and COPD.   ABG results pH 7.1, pCO2 115, pO2 87 bicarb 35.  She was admitted to the hospitalists and started on Bipap, heparin, and inhalers. Pt's repeat ABG showed no significant improvement and she remained lethargic, so PCCM consulted.  Past Medical History  Tobacco abuse  Significant Hospital Events   9/4 Admit to Hospitalists, ICU txfr  Consults:  PCCM  Procedures:  9/5 ETT 9/5 Right femoral artery cathter  Significant Diagnostic Tests:  9/4 CXR>>The lower portion of the left lung and left cardiac border are obscured by dense consolidation or pleural effusion. 9/4 CT chest>>small RLL PE, cardiomegaly and moderate pericardial effusion 9/5 ECHO >> LVEF  70-75%, large pericardial effusion 9/5 LE dopplers >> negative for DVT bilaterally 9/6 CT chest wo contrast >> moderate pericardial effusion, small pleural effusions b/l, stable diffuse enlargement of thyroid gland  Micro Data:  9/4 Sars-CoV-2>>negative 9/4 BCx2>> 9/4 MRSA screen>>negative 9/4 repeat covid>>negative 9/5 BCx2>> 9/5 Respiratory culture >> moderate GPC in clusters, few GNR  Antimicrobials:  9/5 Cefepime>> 9/5 Vancomycin>9/5  Interim history/subjective:  No acute events since transferring to the ICU. Remains sedated and on the ventilator. Is currently requiring levophed support.   Objective   Blood pressure 106/77, pulse 62, temperature (!) 97.5 F (36.4 C), temperature source Bladder, resp. rate 14, height 5\' 5"  (1.651 m), weight 100.4 kg, SpO2 93 %.    Vent Mode: PRVC FiO2 (%):  [40 %-60 %] 40 % Set Rate:  [14 bmp] 14 bmp Vt Set:  [450 mL] 450 mL PEEP:  [8 cmH20] 8 cmH20 Plateau Pressure:  [19 cmH20-20 cmH20] 19 cmH20   Intake/Output Summary (Last 24 hours) at 08/07/2020 1422 Last data filed at 08/07/2020 0933 Gross per 24 hour  Intake 1251.24 ml  Output 275 ml  Net 976.24 ml   Filed Weights   08/05/20 1520 08/06/20 0250  Weight: 68 kg 100.4 kg    General: Elderly, chronically ill-appearing, lying in bed on vent, NAD. HEENT: mucous membranes moist and pink, anicteric. Neuro: sedated CV: normal rate and regular rhythm, s1s2 heard, no m/r/g PULM: course breath sounds bilaterally, exhalation markedly reduced. GI: soft, non-distended. Unable to assess pain. +BS Extremities: bilateral feet are cold to the touch. 1+ edema up to ankles bilaterally.  Skin: no rashes or lesions. Skin on toes appears mottled bilaterally.   Resolved Hospital Problem list     Assessment & Plan:   Acute Hypoxemic and Hypercapnic Respiratory Failure  In setting of likely undiagnosed COPD, large pericardial effusion, and L sided atelectesis vs infiltrate - Maintain full vent  support with SAT/SBT as tolerated - titrate Vent setting to maintain SpO2 greater than or equal to 90%. - HOB elevated 30 degrees. - Plateau pressures less than 30 cm H20.  - Follow chest x-ray, ABG prn. - ABG on 9/6 showing pH 7.39 / pO2 47 / PCO2 51.5 / HCO3 31.5 - Will increase PEEP to 10-12 to increase pO2 - Bronchial hygiene and RT/bronchodilator protocol. - Continue daily prednisone (total 5 day course) - on day 3 (one day of IV solumedrol and 2 days of PO prednisone) - continue scheduled duonebs - Follow up sputum and blood cultures. Continue Cefepime (on day 2). Vancomycin discontinued with negative MRSA screen. - CT chest wo contrast on 9/6 - did not show any findings concerning for lung malignancy  Pericardial effusion, unclear cause - Echo showing LVEF 70-75%, large posterior pericardial effusion, no evidence of tamponade - Consulted cardiology for possible pericardiocentesis for diagnostic purposes/cytology (to r/o underlying malignancy), greatly appreciate recs -As per cardiology, ordered TSH level to rule out hypothyroidism -If TSH normal, will need to consult CVTS for pericardial window as pericardial fluid is located posteriorly -Would need to hold heparin ppx if pericardial window is pursued -Given 1 dose of lasix 40mg  today   Small, RLL PE -ECHO showing no right heart strain -continue heparin gtt  -LE dopplers negative  Acute Kidney Injury Baseline creatinine is unknown. However, creatinine is trending up (Cr 1.66 >> 1.47 >> 1.90) -monitor BMP, UOP, and electrolytes  Polychythemia Likely secondary to chronic hypoxemia from underlying obstructive lung disease - Monitor CBC  Best practice:  Diet: NPO Pain/Anxiety/Delirium protocol (if indicated): fentanyl gtt VAP protocol (if indicated): HOB 30 degrees, suction prn DVT prophylaxis: heparin GI prophylaxis: protonix Glucose control: SII Mobility: bed rest Code Status: full code Family Communication: family  updated, spoke with son on 9/6 Disposition: ICU  Labs   CBC: Recent Labs  Lab 08/05/20 1505 08/06/20 0354 08/06/20 0515 08/06/20 0701 08/06/20 1957 08/07/20 0351 08/07/20 0925  WBC 5.6  --   --  7.5 9.0 8.0  --   NEUTROABS 3.8  --   --   --   --   --   --   HGB 16.8*   < > 20.1* 17.4* 16.6* 15.8* 17.7*  HCT 59.5*   < > 59.0* 61.0* 56.3* 53.7* 52.0*  MCV 98.5  --   --  97.8 95.1 94.4  --   PLT 186  --   --  135* 158 168  --    < > = values in this interval not displayed.    Basic Metabolic Panel: Recent Labs  Lab 08/05/20 1505 08/05/20 1505 08/06/20 0354 08/06/20 0515 08/06/20 0701 08/07/20 0925 08/07/20 0934  NA 136   < > 143 135 138 137 137  K 5.0   < > 4.9 5.0 4.7 4.8 4.9  CL 94*  --   --   --  95*  --  99  CO2 33*  --   --   --  30  --  28  GLUCOSE 95  --   --   --  158*  --  102*  BUN 30*  --   --   --  28*  --  37*  CREATININE 1.66*  --   --   --  1.47*  --  1.90*  CALCIUM 8.9  --   --   --  8.7*  --  8.2*   < > = values in this interval not displayed.   GFR: Estimated Creatinine Clearance: 29.6 mL/min (A) (by C-G formula based on SCr of 1.9 mg/dL (H)). Recent Labs  Lab 08/05/20 1505 08/05/20 1555 08/06/20 0701 08/06/20 1957 08/07/20 0351  PROCALCITON <0.10  --   --   --   --   WBC 5.6  --  7.5 9.0 8.0  LATICACIDVEN  --  1.3  --   --   --     Liver Function Tests: Recent Labs  Lab 08/05/20 1505 08/07/20 0934  AST 14* 10*  ALT 12 12  ALKPHOS 66 51  BILITOT 1.3* 1.3*  PROT 6.7 5.7*  ALBUMIN 3.3* 2.6*   No results for input(s): LIPASE, AMYLASE in the last 168 hours. No results for input(s): AMMONIA in the last 168 hours.  ABG    Component Value Date/Time   PHART 7.391 08/07/2020 0925   PCO2ART 51.5 (H) 08/07/2020 0925   PO2ART 47 (L) 08/07/2020 0925   HCO3 31.5 (H) 08/07/2020 0925   TCO2 33 (H) 08/07/2020 0925   O2SAT 83.0 08/07/2020 0925     Coagulation Profile: Recent Labs  Lab 08/05/20 1700  INR 1.1    Cardiac Enzymes: No  results for input(s): CKTOTAL, CKMB, CKMBINDEX, TROPONINI in the last 168 hours.  HbA1C: Hgb A1c MFr Bld  Date/Time Value Ref Range Status  08/06/2020 07:00 AM 6.0 (H) 4.8 - 5.6 % Final    Comment:    (NOTE) Pre diabetes:          5.7%-6.4%  Diabetes:              >6.4%  Glycemic control for   <7.0% adults with diabetes     CBG: Recent Labs  Lab 08/06/20 1943 08/06/20 2355 08/07/20 0422 08/07/20 0713 08/07/20 1114  GLUCAP 95 96 77 85 79    Critical care time: 32 minutes     Merrilyn Puma, MD

## 2020-08-07 NOTE — Progress Notes (Signed)
Pt transported on the ventilator from 2M08 to CT2 and back. Pt transported with RT, RN x2 and transport. Pt tolerated fairly well. After scan pt woke up and sat straight up in bed desaturating to the 70's. Pt recovered to 100%. FiO2 weaned back to 40% upon arrival to pt room.  RT will continue to monitor.

## 2020-08-07 NOTE — Progress Notes (Signed)
Initial Nutrition Assessment  DOCUMENTATION CODES:   Not applicable  INTERVENTION:   If unable to extubate patient today, recommend initiate tube feeding via OG tube: Vital High Protein at 50 ml/h (1200 ml per day) Prosource TF 45 ml once daily  Provides 1240 kcal (1401 kcal total with propofol), 116 gm protein, 1003 ml free water daily  NUTRITION DIAGNOSIS:   Inadequate oral intake related to inability to eat as evidenced by NPO status.  GOAL:   Provide needs based on ASPEN/SCCM guidelines  MONITOR:   Vent status, Labs, Weight trends, Skin  REASON FOR ASSESSMENT:   Consult Assessment of nutrition requirement/status  ASSESSMENT:   74 yo female admitted with SOB, required intubation shortly after admission. Work-up revealed small PE, suspected COPD exacerbation. PMH includes tobacco abuse.   Discussed patient with RN today. Patient currently out of room in a procedure (chest CT scan). Hopeful for extubation later today, but may require further tests/procedures depending on results of chest CT scan.   Patient is currently intubated on ventilator support MV: 6.2 L/min Temp (24hrs), Avg:98.2 F (36.8 C), Min:97.3 F (36.3 C), Max:98.8 F (37.1 C)  Propofol: 6.1 ml/hr providing 161 kcal from lipid.  Labs reviewed.  CBG: 85-79  Medications reviewed and include novolog, lasix, colace, prednisone, levophed, propofol.  Most recent weight in EMR 68 kg (150 lbs) on 06/29/20; suspect this was not an actual measured weight as current measured weight documented at 100.4 kg.  I/O + 1,034 ml since admission UOP 550 ml x 24 hours  NUTRITION - FOCUSED PHYSICAL EXAM:  unable to complete, patient out of room for procedure  Diet Order:   Diet Order            Diet NPO time specified  Diet effective now                 EDUCATION NEEDS:   Not appropriate for education at this time  Skin:  Skin Assessment: Reviewed RN Assessment (MASD to hip & abdomen)  Last BM:   9/4  Height:   Ht Readings from Last 1 Encounters:  08/05/20 5\' 5"  (1.651 m)    Weight:   Wt Readings from Last 1 Encounters:  08/06/20 100.4 kg    Ideal Body Weight:  56.8 kg  BMI:  Body mass index is 36.85 kg/m.  Estimated Nutritional Needs:   Kcal:  1105-1405  Protein:  >/= 113 gm  Fluid:  >/= 1.5 L    10/06/20, RD, LDN, CNSC Please refer to Amion for contact information.

## 2020-08-07 NOTE — Progress Notes (Signed)
OT Cancellation Note and Dis  Patient Details Name: KAYLENE DAWN MRN: [id number] DOB: 10/10/1946   Cancelled Treatment:    Reason Eval/Treat Not Completed: Patient not medically ready.Remains sedated and on the ventilator. Is currently requiring levophed support. Will sign of for now --please re-order as appropriate.  Ignacia Palma, OTR/L Acute Rehab Services Pager [phone] Office [phone]     Evette Georges 08/07/2020, 8:30 AM

## 2020-08-07 NOTE — Progress Notes (Signed)
ANTICOAGULATION CONSULT NOTE - Follow Up Consult  Pharmacy Consult for Heparin Indication: pulmonary embolus  No Known Allergies  Patient Measurements: Height: 5\' 5"  (165.1 cm) Weight: 100.4 kg (221 lb 7.2 oz) IBW/kg (Calculated) : 57 Heparin Dosing Weight: 68kg  Vital Signs: Temp: 97.5 F (36.4 C) (09/06 0700) Temp Source: Bladder (09/06 0400) BP: 105/66 (09/06 0700) Pulse Rate: 65 (09/06 0700)  Labs: Recent Labs    08/05/20 1505 08/05/20 1505 08/05/20 1700 08/06/20 0354 08/06/20 0700 08/06/20 0701 08/06/20 0701 08/06/20 1957 08/07/20 0350 08/07/20 0351  HGB 16.8*   < >  --    < >  --  17.4*   < > 16.6*  --  15.8*  HCT 59.5*   < >  --    < >  --  61.0*  --  56.3*  --  53.7*  PLT 186   < >  --   --   --  135*  --  158  --  168  APTT  --   --  37*  --   --   --   --   --   --   --   LABPROT  --   --  13.5  --   --   --   --   --   --   --   INR  --   --  1.1  --   --   --   --   --   --   --   HEPARINUNFRC  --   --   --   --  0.35  --   --  0.65 0.71*  --   CREATININE 1.66*  --   --   --   --  1.47*  --   --   --   --   TROPONINIHS 19*  --   --   --   --   --   --   --   --   --    < > = values in this interval not displayed.    Estimated Creatinine Clearance: 38.2 mL/min (A) (by C-G formula based on SCr of 1.47 mg/dL (H)).   Medications:  Infusions:  . sodium chloride 10 mL/hr at 08/07/20 0700  . ceFEPime (MAXIPIME) IV Stopped (08/06/20 2203)  . fentaNYL infusion INTRAVENOUS 150 mcg/hr (08/07/20 0700)  . heparin 1,150 Units/hr (08/07/20 0700)  . norepinephrine (LEVOPHED) Adult infusion 1 mcg/min (08/07/20 0700)  . propofol (DIPRIVAN) infusion 15 mcg/kg/min (08/07/20 0700)    Assessment: 74 years of age who presented to the ED with shortness of breath and found to have small pulmonary embolism.   Heparin level slightly high at 0.71 - will reduce to prevent further accumulation. H/H is stable and platelets have improved.    Goal of Therapy:  Heparin  level 0.3-0.7 units/ml Monitor platelets by anticoagulation protocol: Yes   Plan:  Decrease Heparin to 1100 units/hr Daily Heparin level and CBC while on therapy.   71, PharmD, BCPS, BCCCP Clinical Pharmacist Please refer to [facility] for [facility] numbers 08/07/2020, 7:30 AM

## 2020-08-07 NOTE — Plan of Care (Signed)
  Problem: Activity: Goal: Ability to tolerate increased activity will improve Outcome: Progressing   Problem: Respiratory: Goal: Ability to maintain a clear airway and adequate ventilation will improve Outcome: Progressing   Problem: Role Relationship: Goal: Method of communication will improve Outcome: Progressing   Problem: Education: Goal: Knowledge of disease or condition will improve Outcome: Progressing Goal: Knowledge of the prescribed therapeutic regimen will improve Outcome: Progressing Goal: Individualized Educational Video(s) Outcome: Progressing   Problem: Activity: Goal: Ability to tolerate increased activity will improve Outcome: Progressing Goal: Will verbalize the importance of balancing activity with adequate rest periods Outcome: Progressing   Problem: Respiratory: Goal: Ability to maintain a clear airway will improve Outcome: Progressing Goal: Levels of oxygenation will improve Outcome: Progressing Goal: Ability to maintain adequate ventilation will improve Outcome: Progressing

## 2020-08-07 NOTE — Consult Note (Addendum)
Cardiology Consultation:   Patient ID: Miranda Cohen; [id number]; [birth year]   Admit date: 08/05/2020 Date of Consult: 08/07/2020  Primary Care Provider: Patient, No Pcp Per Primary Cardiologist: No primary care provider on file. New Primary Electrophysiologist:  None   Patient Profile:   Miranda Cohen is a 73 y.o. female with a hx of no recent medical care who is being seen today for the evaluation of pericardial effusion at the request of Dr Erin Fulling.  History of Present Illness:   Miranda Cohen was in the ER on 06/29/2020 with gen malaise and cough, O2 sats 85% on RA, pt left AMA because of the wait.   was admitted on 08/05/2020 with SOB w/ any exertion, some coughing, some nausea, no fevers or chills. O2 sats initially 50%. ABG w/ pH 7.118, PCO2 115, PO2 87 (on O2), bicarb 35. D-dimer > 20 but COVID negative x 2. BNP 908, Cr 1.66, Hgb 16.8  Pt initially placed on BiPAP and initially tolerated well. However, ABG did not improve and pt became more lethargic. CCM consulted >> ETT.   CT showed small PE RLL >> heparin. No DVT on LE dopplers.   Echo w/ large pericardial effusion, Cards asked to see.   Pt is intubated and sedated, no family present. Information is obtained from chart notes and staff. No reports of chest pain.  History reviewed. No pertinent past medical history. No recent medical care.  History reviewed. No pertinent surgical history.   Prior to Admission medications   Medication Sig Start Date End Date Taking? Authorizing Provider  acetaminophen (TYLENOL) 500 MG tablet Take 1,000 mg by mouth every 6 (six) hours as needed for headache (pain).   Yes [provider]    Inpatient Medications: Scheduled Meds: . chlorhexidine gluconate (MEDLINE KIT)  15 mL Mouth Rinse BID  . Chlorhexidine Gluconate Cloth  6 each Topical Q0600  . docusate  100 mg Per Tube BID  . fentaNYL (SUBLIMAZE) injection  25 mcg Intravenous Once  . insulin aspart  0-9 Units Subcutaneous  Q4H  . ipratropium-albuterol  3 mL Nebulization QID  . mouth rinse  15 mL Mouth Rinse 10 times per day  . nicotine  21 mg Transdermal Daily  . pantoprazole (PROTONIX) IV  40 mg Intravenous Daily  . [START ON 08/08/2020] polyethylene glycol  17 g Per Tube Daily  . [START ON 08/08/2020] predniSONE  40 mg Per Tube Q breakfast   Continuous Infusions: . sodium chloride 10 mL/hr at 08/07/20 0700  . ceFEPime (MAXIPIME) IV 2 g (08/07/20 0905)  . fentaNYL infusion INTRAVENOUS 150 mcg/hr (08/07/20 0700)  . heparin 1,100 Units/hr (08/07/20 0826)  . norepinephrine (LEVOPHED) Adult infusion 1 mcg/min (08/07/20 0700)  . propofol (DIPRIVAN) infusion 15 mcg/kg/min (08/07/20 0700)   PRN Meds: albuterol, fentaNYL  Allergies:   No Known Allergies  Social History:   Social History   Socioeconomic History  . Marital status: Divorced    Spouse name: Not on file  . Number of children: Not on file  . Years of education: Not on file  . Highest education level: Not on file  Occupational History  . Not on file  Tobacco Use  . Smoking status: Current Every Day Smoker    Packs/day: 1.50    Types: Cigarettes  Vaping Use  . Vaping Use: Never used  Substance and Sexual Activity  . Alcohol use: Never  . Drug use: Never  . Sexual activity: Not on file  Other Topics Concern  .  Not on file  Social History Narrative  . Not on file   Social Determinants of Health   Financial Resource Strain:   . Difficulty of Paying Living Expenses: Not on file  Food Insecurity:   . Worried About Charity fundraiser in the Last Year: Not on file  . Ran Out of Food in the Last Year: Not on file  Transportation Needs:   . Lack of Transportation (Medical): Not on file  . Lack of Transportation (Non-Medical): Not on file  Physical Activity:   . Days of Exercise per Week: Not on file  . Minutes of Exercise per Session: Not on file  Stress:   . Feeling of Stress : Not on file  Social Connections:   . Frequency of  Communication with Friends and Family: Not on file  . Frequency of Social Gatherings with Friends and Family: Not on file  . Attends Religious Services: Not on file  . Active Member of Clubs or Organizations: Not on file  . Attends Archivist Meetings: Not on file  . Marital Status: Not on file  Intimate Partner Violence:   . Fear of Current or Ex-Partner: Not on file  . Emotionally Abused: Not on file  . Physically Abused: Not on file  . Sexually Abused: Not on file    Family History:  Not able to obtain, pt intubated History reviewed. No pertinent family history. Family Status:  No family status information on file.    ROS:  Please see the history of present illness.  All other ROS reviewed and negative.     Physical Exam/Data:   Vitals:   08/07/20 0400 08/07/20 0500 08/07/20 0600 08/07/20 0700  BP: (!) 100/54 (!) 113/53 (!) 107/57 105/66  Pulse: 66 64 66 65  Resp: 14 14 15 18   Temp: 97.7 F (36.5 C) 97.7 F (36.5 C) 97.7 F (36.5 C) (!) 97.5 F (36.4 C)  TempSrc: Bladder     SpO2: 93% 94% 92% 92%  Weight:      Height:        Intake/Output Summary (Last 24 hours) at 08/07/2020 0928 Last data filed at 08/07/2020 0700 Gross per 24 hour  Intake 1434.04 ml  Output 550 ml  Net 884.04 ml    Last 3 Weights 08/06/2020 08/05/2020 06/29/2020  Weight (lbs) 221 lb 7.2 oz 150 lb 150 lb  Weight (kg) 100.45 kg 68.04 kg 68.04 kg     Body mass index is 36.85 kg/m.   General:  Well nourished, well developed, female sedated on the vent HEENT: normal Lymph: no adenopathy Neck: JVD - not able to assess 2nd body habitus Endocrine:  No thryomegaly Vascular: No carotid bruits; upper extremity pulses 2+  Cardiac:  normal S1, S2; RRR; no murmur Lungs:  rales bilaterally, no wheezing, rhonchi Abd: soft, nontender, no hepatomegaly  Ext: no edema; both feet w/ patchy purplish discolorations, unable to palpate pulses, cap refill delayed. Not able to palpate good femoral  pulses Musculoskeletal:  No deformities, BUE and BLE strength not tested Skin: warm and dry  Neuro:  CNs 2-12 intact, no focal abnormalities noted Psych:  Normal affect   EKG:  The EKG was personally reviewed and demonstrates:  08/05/2020 SR, HR 74, no acute ischemic changes, decreased QRS amplitude Telemetry:  Telemetry was personally reviewed and demonstrates:  SR, ST   CV studies:   LE DOPPLERS: 08/06/2020 Summary:  RIGHT:  - There is no evidence of deep vein thrombosis in the lower  extremity.  However, portions of this examination were limited- see technologist  comments above.    LEFT:  - There is no evidence of deep vein thrombosis in the lower extremity.  However, portions of this examination were limited- see technologist  comments above.   ECHO: 08/06/2020 1. Left ventricular ejection fraction, by estimation, is 70 to 75%. The  left ventricle has hyperdynamic function. The left ventricle has no  regional wall motion abnormalities. There is severe concentric left  ventricular hypertrophy. Left ventricular  diastolic parameters are indeterminate.  2. Right ventricular systolic function was not well visualized. The right  ventricular size is not well visualized.  3. Large pericardial effusion. The pericardial effusion is posterior to  the left ventricle.  4. The mitral valve is grossly normal. Trivial mitral valve  regurgitation.  5. The aortic valve is tricuspid. Aortic valve regurgitation is not  visualized. No aortic stenosis is present.   Laboratory Data:   Chemistry Recent Labs  Lab 08/05/20 1505 08/05/20 1505 08/06/20 0354 08/06/20 0515 08/06/20 0701  NA 136   < > 143 135 138  K 5.0   < > 4.9 5.0 4.7  CL 94*  --   --   --  95*  CO2 33*  --   --   --  30  GLUCOSE 95  --   --   --  158*  BUN 30*  --   --   --  28*  CREATININE 1.66*  --   --   --  1.47*  CALCIUM 8.9  --   --   --  8.7*  GFRNONAA 30*  --   --   --  34*  GFRAA 34*  --   --   --   40*  ANIONGAP 9  --   --   --  13   < > = values in this interval not displayed.    Lab Results  Component Value Date   ALT 12 08/05/2020   AST 14 (L) 08/05/2020   ALKPHOS 66 08/05/2020   BILITOT 1.3 (H) 08/05/2020   Hematology Recent Labs  Lab 08/06/20 0701 08/06/20 1957 08/07/20 0351  WBC 7.5 9.0 8.0  RBC 6.24* 5.92* 5.69*  HGB 17.4* 16.6* 15.8*  HCT 61.0* 56.3* 53.7*  MCV 97.8 95.1 94.4  MCH 27.9 28.0 27.8  MCHC 28.5* 29.5* 29.4*  RDW 16.6* 15.8* 16.0*  PLT 135* 158 168   Cardiac Enzymes High Sensitivity Troponin:   Recent Labs  Lab 08/05/20 1505  TROPONINIHS 19*      BNP Recent Labs  Lab 08/05/20 1505  BNP 908.3*    DDimer  Recent Labs  Lab 08/05/20 1700  DDIMER >20.00*   TSH: No results found for: TSH Lipids: Lab Results  Component Value Date   TRIG 138 08/07/2020   HgbA1c: Lab Results  Component Value Date   HGBA1C 6.0 (H) 08/06/2020   Magnesium: No results found for: MG   Radiology/Studies:  CT Angio Chest PE W and/or Wo Contrast  Result Date: 08/05/2020 CLINICAL DATA:  Shortness of breath and marked hypoxia. The patient reports being ill since 07/02/2020 with shortness of breath, cough and diarrhea. EXAM: CT ANGIOGRAPHY CHEST WITH CONTRAST TECHNIQUE: Multidetector CT imaging of the chest was performed using the standard protocol during bolus administration of intravenous contrast. Multiplanar CT image reconstructions and MIPs were obtained to evaluate the vascular anatomy. CONTRAST:  70m OMNIPAQUE IOHEXOL 350 MG/ML SOLN COMPARISON:  Portable chest obtained earlier today. FINDINGS:  Cardiovascular: Enlarged heart. Moderate-sized pericardial effusion with a maximum thickness of 3.2 cm. Small elongated right lower lobe pulmonary arterial filling defect, best seen on image number 233 series 7. No other pulmonary arterial filling defects are seen. Enlarged central pulmonary arteries. The main pulmonary artery segment measures 3.3 cm in diameter on  image number 160 series 7. Atheromatous calcifications, including the coronary arteries and aorta. Mediastinum/Nodes: Diffusely enlarged thyroid gland with no visible nodules in the included portions. No enlarged lymph nodes. Lungs/Pleura: Small bilateral pleural effusions. Bilateral lower lobe atelectasis, greater on the left. There is also posterior left upper lobe atelectasis. Mild bilateral centrilobular and paraseptal bullous changes. Upper Abdomen: Unremarkable. Musculoskeletal: Thoracic spine degenerative changes. Review of the MIP images confirms the above findings. IMPRESSION: 1. Single small right lower lobe pulmonary embolus, not large enough cause right heart strain. 2. Cardiomegaly and moderate-sized pericardial effusion. 3. Small bilateral pleural effusions. 4. Bilateral lower lobe atelectasis, greater on the left. 5. Posterior left upper lobe atelectasis. 6. Enlarged central pulmonary arteries, compatible with pulmonary arterial hypertension. 7. Mild changes of COPD. 8. Thyroid goiter. Recommend elective outpatient thyroid ultrasound (ref: J Am Coll Radiol. 2015 Feb;12(2): 143-50). Critical Value/emergent results were called by telephone at the time of interpretation on 08/05/2020 at 6:51 pm to provider Suella Broad, PA, who verbally acknowledged these results. Aortic Atherosclerosis (ICD10-I70.0) and Emphysema (ICD10-J43.9). Electronically Signed   By: Claudie Revering M.D.   On: 08/05/2020 18:59   DG CHEST PORT 1 VIEW  Result Date: 08/06/2020 CLINICAL DATA:  Intubation EXAM: PORTABLE CHEST 1 VIEW COMPARISON:  CTA chest dated 08/05/2020 FINDINGS: Endotracheal tube terminates 15 mm above the carina. Mild left lower lobe opacity, likely atelectasis. Right lung is essentially clear. No pleural effusion or pneumothorax. Cardiomegaly.  Thoracic aortic atherosclerosis. Enteric tube courses into the proximal stomach. IMPRESSION: Endotracheal tube terminates 15 mm above the carina. Mild left lower lobe opacity,  likely atelectasis. Electronically Signed   By: Julian Hy M.D.   On: 08/06/2020 04:07   DG Chest Port 1 View  Result Date: 08/05/2020 CLINICAL DATA:  Cough and shortness of breath.  COVID-19 positive. EXAM: PORTABLE CHEST 1 VIEW COMPARISON:  July 13, 2010 FINDINGS: Calcific atherosclerotic disease of the aorta. The lower portion of the left lung and left cardiac border obscured by dense consolidation or pleural effusion. Minimal peribronchial nodularity in the right lower lobe. Osseous structures are without acute abnormality. Soft tissues are grossly normal. IMPRESSION: 1. The lower portion of the left lung and left cardiac border are obscured by dense consolidation or pleural effusion. 2. Minimal peribronchial nodularity in the right lower lobe likely infectious or inflammatory. Electronically Signed   By: Fidela Salisbury M.D.   On: 08/05/2020 15:58   ECHOCARDIOGRAM COMPLETE  Result Date: 08/06/2020    ECHOCARDIOGRAM REPORT   Patient Name:   Miranda Cohen Date of Exam: 08/06/2020 Medical Rec #:  [id number]       Height:       65.0 in Accession #:    [id number]      Weight:       150.0 lb Date of Birth:  [birth year]        BSA:          1.750 m Patient Age:    22 years        BP:           71/41 mmHg Patient Gender: F  HR:           64 bpm. Exam Location:  Inpatient Procedure: 2D Echo, Cardiac Doppler and Color Doppler Indications:    Pulmonary embolus  History:        Patient has no prior history of Echocardiogram examinations.                 Risk Factors:Current Smoker.  Sonographer:    Clayton Lefort RDCS (AE) Referring Phys: [phone] Etta Quill  Sonographer Comments: Patient is morbidly obese and echo performed with patient supine and on artificial respirator. IMPRESSIONS  1. Left ventricular ejection fraction, by estimation, is 70 to 75%. The left ventricle has hyperdynamic function. The left ventricle has no regional wall motion abnormalities. There is severe concentric left  ventricular hypertrophy. Left ventricular diastolic parameters are indeterminate.  2. Right ventricular systolic function was not well visualized. The right ventricular size is not well visualized.  3. Large pericardial effusion. The pericardial effusion is posterior to the left ventricle.  4. The mitral valve is grossly normal. Trivial mitral valve regurgitation.  5. The aortic valve is tricuspid. Aortic valve regurgitation is not visualized. No aortic stenosis is present. Conclusion(s)/Recommendation(s): Difficult images, RV not well visualized but appears grossly normal on subcostal images. There is a large pericardial effusion, with largest dimension posterior to LV measured as 2.55 cm. No clear respiratory variation, but patient is on ventilator. No clear RA or RV diastolic collapse seen. No apparent tamponade by echo parameters, but recommend clinical correlation. FINDINGS  Left Ventricle: Left ventricular ejection fraction, by estimation, is 70 to 75%. The left ventricle has hyperdynamic function. The left ventricle has no regional wall motion abnormalities. The left ventricular internal cavity size was small. There is severe concentric left ventricular hypertrophy. Left ventricular diastolic parameters are indeterminate. Right Ventricle: The right ventricular size is not well visualized. Right vetricular wall thickness was not assessed. Right ventricular systolic function was not well visualized. Left Atrium: Left atrial size was normal in size. Right Atrium: Right atrial size was normal in size. Pericardium: A large pericardial effusion is present. The pericardial effusion is posterior to the left ventricle. Mitral Valve: The mitral valve is grossly normal. Trivial mitral valve regurgitation. MV peak gradient, 6.6 mmHg. The mean mitral valve gradient is 2.0 mmHg. Tricuspid Valve: The tricuspid valve is grossly normal. Tricuspid valve regurgitation is trivial. Aortic Valve: The aortic valve is tricuspid.  Aortic valve regurgitation is not visualized. No aortic stenosis is present. Aortic valve mean gradient measures 5.0 mmHg. Aortic valve peak gradient measures 7.1 mmHg. Aortic valve area, by VTI measures 2.89 cm. Pulmonic Valve: The pulmonic valve was grossly normal. Pulmonic valve regurgitation is mild. Aorta: The aortic root and ascending aorta are structurally normal, with no evidence of dilitation. Venous: IVC assessment for right atrial pressure unable to be performed due to mechanical ventilation. IAS/Shunts: No atrial level shunt detected by color flow Doppler.  LEFT VENTRICLE PLAX 2D LVIDd:         3.50 cm  Diastology LVIDs:         2.00 cm  LV e' lateral:   4.28 cm/s LV PW:         1.80 cm  LV E/e' lateral: 22.6 LV IVS:        1.80 cm  LV e' medial:    3.38 cm/s LVOT diam:     1.90 cm  LV E/e' medial:  28.6 LV SV:         96  LV SV Index:   55 LVOT Area:     2.84 cm  RIGHT VENTRICLE            IVC RV Basal diam:  3.10 cm    IVC diam: 1.90 cm RV S prime:     9.23 cm/s TAPSE (M-mode): 1.2 cm LEFT ATRIUM             Index       RIGHT ATRIUM           Index LA diam:        3.30 cm 1.89 cm/m  RA Area:     14.60 cm LA Vol (A2C):   43.0 ml 24.56 ml/m RA Volume:   33.20 ml  18.97 ml/m LA Vol (A4C):   37.0 ml 21.14 ml/m LA Biplane Vol: 41.4 ml 23.65 ml/m  AORTIC VALVE AV Area (Vmax):    2.75 cm AV Area (Vmean):   2.71 cm AV Area (VTI):     2.89 cm AV Vmax:           133.00 cm/s AV Vmean:          101.000 cm/s AV VTI:            0.332 m AV Peak Grad:      7.1 mmHg AV Mean Grad:      5.0 mmHg LVOT Vmax:         129.00 cm/s LVOT Vmean:        96.600 cm/s LVOT VTI:          0.338 m LVOT/AV VTI ratio: 1.02  AORTA Ao Root diam: 3.30 cm MITRAL VALVE MV Area (PHT): 2.29 cm     SHUNTS MV Peak grad:  6.6 mmHg     Systemic VTI:  0.34 m MV Mean grad:  2.0 mmHg     Systemic Diam: 1.90 cm MV Vmax:       1.28 m/s MV Vmean:      65.9 cm/s MV Decel Time: 331 msec MV E velocity: 96.70 cm/s MV A velocity: 129.00 cm/s MV E/A  ratio:  0.75 Buford Dresser MD Electronically signed by Buford Dresser MD Signature Date/Time: 08/06/2020/3:13:56 PM    Final    VAS [place] LOWER EXTREMITY VENOUS (DVT)  Result Date: 08/06/2020  Lower Venous DVTStudy Indications: Pulmonary embolism.  Limitations: Body habitus, line and ventilator. Comparison Study: No prior study on file Performing Technologist: Sharion Dove RVS  Examination Guidelines: A complete evaluation includes B-mode imaging, spectral Doppler, color Doppler, and power Doppler as needed of all accessible portions of each vessel. Bilateral testing is considered an integral part of a complete examination. Limited examinations for reoccurring indications may be performed as noted. The reflux portion of the exam is performed with the patient in reverse Trendelenburg.  +---------+---------------+---------+-----------+----------+--------------+ RIGHT    CompressibilityPhasicitySpontaneityPropertiesThrombus Aging +---------+---------------+---------+-----------+----------+--------------+ CFV                                                   Not visualized +---------+---------------+---------+-----------+----------+--------------+ SFJ                                                   Not visualized +---------+---------------+---------+-----------+----------+--------------+ FV Prox  Full  Yes      Yes                                 +---------+---------------+---------+-----------+----------+--------------+ FV Mid   Full                                                        +---------+---------------+---------+-----------+----------+--------------+ FV DistalFull                                                        +---------+---------------+---------+-----------+----------+--------------+ PFV                                                   Not visualized  +---------+---------------+---------+-----------+----------+--------------+ POP                     Yes      Yes                                 +---------+---------------+---------+-----------+----------+--------------+ PTV      Full                                                        +---------+---------------+---------+-----------+----------+--------------+ PERO     Full                                                        +---------+---------------+---------+-----------+----------+--------------+   +---------+---------------+---------+-----------+----------+--------------+ LEFT     CompressibilityPhasicitySpontaneityPropertiesThrombus Aging +---------+---------------+---------+-----------+----------+--------------+ CFV      Full           Yes      Yes                                 +---------+---------------+---------+-----------+----------+--------------+ SFJ      Full                                                        +---------+---------------+---------+-----------+----------+--------------+ FV Prox  Full                                                        +---------+---------------+---------+-----------+----------+--------------+ FV Mid   Full                                                        +---------+---------------+---------+-----------+----------+--------------+  FV DistalFull                                                        +---------+---------------+---------+-----------+----------+--------------+ PFV      Full                                                        +---------+---------------+---------+-----------+----------+--------------+ POP                     Yes      Yes                                 +---------+---------------+---------+-----------+----------+--------------+ PTV      Full                                                         +---------+---------------+---------+-----------+----------+--------------+ PERO     Full                                                        +---------+---------------+---------+-----------+----------+--------------+     Summary: RIGHT: - There is no evidence of deep vein thrombosis in the lower extremity. However, portions of this examination were limited- see technologist comments above.  LEFT: - There is no evidence of deep vein thrombosis in the lower extremity. However, portions of this examination were limited- see technologist comments above.  *See table(s) above for measurements and observations.    Preliminary     Assessment and Plan:   1. Pericardial effusion - no tamponade on echo but effusion is large and posterior - cause unclear, will ck TSH (thyroid goiter seen on CT) - significant resp issues, BNP elevated, but it is not clear if she needs diuresis, discuss w/ MD  2. PE and other resp issues - on steroids, ABX, nebs per CCM   Principal Problem:   Acute respiratory failure with hypoxia and hypercapnia (HCC) Active Problems:   Acute pulmonary embolism (HCC)   Pericardial effusion   COPD (chronic obstructive pulmonary disease) (HCC)   Renal insufficiency     For questions or updates, please contact [facility] Please consult [email or web address] for contact info under Cardiology/STEMI.   SignedRosaria Ferries, PA-C  08/07/2020 9:28 AM

## 2020-08-07 NOTE — Progress Notes (Signed)
ABG results given to Dr. Francine Graven: PH 7.42 PCO2 47.2 PAO2 56  HCO3 31.1 SAO2 90%.  No new orders received at this time. RT will continue to monitor.

## 2020-08-07 NOTE — Plan of Care (Signed)
  Problem: Activity: Goal: Ability to tolerate increased activity will improve Outcome: Progressing   Problem: Respiratory: Goal: Ability to maintain a clear airway and adequate ventilation will improve Outcome: Progressing   Problem: Role Relationship: Goal: Method of communication will improve Outcome: Progressing   Problem: Education: Goal: Knowledge of disease or condition will improve Outcome: Progressing Goal: Knowledge of the prescribed therapeutic regimen will improve Outcome: Progressing Goal: Individualized Educational Video(s) Outcome: Progressing   Problem: Activity: Goal: Ability to tolerate increased activity will improve Outcome: Progressing Goal: Will verbalize the importance of balancing activity with adequate rest periods Outcome: Progressing   Problem: Respiratory: Goal: Ability to maintain a clear airway will improve Outcome: Progressing Goal: Levels of oxygenation will improve Outcome: Progressing Goal: Ability to maintain adequate ventilation will improve Outcome: Progressing   Problem: Education: Goal: Ability to describe self-care measures that may prevent or decrease complications (Diabetes Survival Skills Education) will improve Outcome: Progressing Goal: Individualized Educational Video(s) Outcome: Progressing   Problem: Coping: Goal: Ability to adjust to condition or change in health will improve Outcome: Progressing   Problem: Fluid Volume: Goal: Ability to maintain a balanced intake and output will improve Outcome: Progressing   Problem: Health Behavior/Discharge Planning: Goal: Ability to identify and utilize available resources and services will improve Outcome: Progressing Goal: Ability to manage health-related needs will improve Outcome: Progressing   Problem: Metabolic: Goal: Ability to maintain appropriate glucose levels will improve Outcome: Progressing   Problem: Nutritional: Goal: Maintenance of adequate nutrition will  improve Outcome: Progressing Goal: Progress toward achieving an optimal weight will improve Outcome: Progressing   Problem: Skin Integrity: Goal: Risk for impaired skin integrity will decrease Outcome: Progressing   Problem: Tissue Perfusion: Goal: Adequacy of tissue perfusion will improve Outcome: Progressing

## 2020-08-08 ENCOUNTER — Inpatient Hospital Stay (HOSPITAL_COMMUNITY): Payer: Medicare Other

## 2020-08-08 DIAGNOSIS — I313 Pericardial effusion (noninflammatory): Secondary | ICD-10-CM

## 2020-08-08 DIAGNOSIS — R0989 Other specified symptoms and signs involving the circulatory and respiratory systems: Secondary | ICD-10-CM

## 2020-08-08 LAB — BASIC METABOLIC PANEL
Anion gap: 11 (ref 5–15)
BUN: 45 mg/dL — ABNORMAL HIGH (ref 8–23)
CO2: 27 mmol/L (ref 22–32)
Calcium: 8.6 mg/dL — ABNORMAL LOW (ref 8.9–10.3)
Chloride: 100 mmol/L (ref 98–111)
Creatinine, Ser: 1.92 mg/dL — ABNORMAL HIGH (ref 0.44–1.00)
GFR calc Af Amer: 29 mL/min — ABNORMAL LOW (ref >60)
GFR calc non Af Amer: 25 mL/min — ABNORMAL LOW (ref >60)
Glucose, Bld: 108 mg/dL — ABNORMAL HIGH (ref 70–99)
Potassium: 5 mmol/L (ref 3.5–5.1)
Sodium: 138 mmol/L (ref 135–145)

## 2020-08-08 LAB — HEPARIN LEVEL (UNFRACTIONATED)
Heparin Unfractionated: 0.79 IU/mL — ABNORMAL HIGH (ref 0.30–0.70)
Heparin Unfractionated: 0.62 IU/mL (ref 0.30–0.70)
Heparin Unfractionated: 0.54 IU/mL (ref 0.30–0.70)

## 2020-08-08 LAB — GLUCOSE, CAPILLARY
Glucose-Capillary: 106 mg/dL — ABNORMAL HIGH (ref 70–99)
Glucose-Capillary: 114 mg/dL — ABNORMAL HIGH (ref 70–99)
Glucose-Capillary: 126 mg/dL — ABNORMAL HIGH (ref 70–99)
Glucose-Capillary: 134 mg/dL — ABNORMAL HIGH (ref 70–99)
Glucose-Capillary: 136 mg/dL — ABNORMAL HIGH (ref 70–99)
Glucose-Capillary: 87 mg/dL (ref 70–99)

## 2020-08-08 LAB — POCT I-STAT 7, (LYTES, BLD GAS, ICA,H+H)
Acid-Base Excess: 5 mmol/L — ABNORMAL HIGH (ref 0.0–2.0)
Bicarbonate: 30.9 mmol/L — ABNORMAL HIGH (ref 20.0–28.0)
Calcium, Ion: 1.14 mmol/L — ABNORMAL LOW (ref 1.15–1.40)
HCT: 50 % — ABNORMAL HIGH (ref 36.0–46.0)
Hemoglobin: 17 g/dL — ABNORMAL HIGH (ref 12.0–15.0)
O2 Saturation: 98 %
Patient temperature: 36
Potassium: 4.5 mmol/L (ref 3.5–5.1)
Sodium: 136 mmol/L (ref 135–145)
TCO2: 32 mmol/L (ref 22–32)
pCO2 arterial: 47.2 mmHg (ref 32.0–48.0)
pH, Arterial: 7.419 (ref 7.350–7.450)
pO2, Arterial: 96 mmHg (ref 83.0–108.0)

## 2020-08-08 LAB — CBC
HCT: 51.8 % — ABNORMAL HIGH (ref 36.0–46.0)
Hemoglobin: 15.4 g/dL — ABNORMAL HIGH (ref 12.0–15.0)
MCH: 27.5 pg (ref 26.0–34.0)
MCHC: 29.7 g/dL — ABNORMAL LOW (ref 30.0–36.0)
MCV: 92.5 fL (ref 80.0–100.0)
Platelets: 171 10*3/uL (ref 150–400)
RBC: 5.6 MIL/uL — ABNORMAL HIGH (ref 3.87–5.11)
RDW: 16 % — ABNORMAL HIGH (ref 11.5–15.5)
WBC: 10.2 10*3/uL (ref 4.0–10.5)
nRBC: 0 % (ref 0.0–0.2)

## 2020-08-08 LAB — THYROID PEROXIDASE ANTIBODY: Thyroperoxidase Ab SerPl-aCnc: >600 IU/mL — ABNORMAL HIGH (ref 0–34)

## 2020-08-08 LAB — ECHOCARDIOGRAM LIMITED
Height: 65 in
Weight: 3636.71 oz

## 2020-08-08 LAB — CORTISOL-AM, BLOOD: Cortisol - AM: 4.5 ug/dL — ABNORMAL LOW (ref 6.7–22.6)

## 2020-08-08 LAB — T3, FREE: T3, Free: 0.9 pg/mL — ABNORMAL LOW (ref 2.0–4.4)

## 2020-08-08 LAB — TRIGLYCERIDES: Triglycerides: 144 mg/dL (ref <150)

## 2020-08-08 MED ORDER — CALCIUM GLUCONATE-NACL 1-0.675 GM/50ML-% IV SOLN
1.0000 g | Freq: Once | INTRAVENOUS | Status: AC
  Administered 2020-08-08: 1000 mg via INTRAVENOUS
  Filled 2020-08-08: qty 50

## 2020-08-08 MED ORDER — LIOTHYRONINE SODIUM 5 MCG PO TABS
5.0000 ug | ORAL_TABLET | Freq: Three times a day (TID) | ORAL | Status: DC
  Filled 2020-08-08 (×2): qty 1

## 2020-08-08 MED ORDER — LEVOTHYROXINE SODIUM 100 MCG/5ML IV SOLN
200.0000 ug | Freq: Once | INTRAVENOUS | Status: AC
  Administered 2020-08-08: 200 ug via INTRAVENOUS
  Filled 2020-08-08: qty 10

## 2020-08-08 MED ORDER — HYDROCORTISONE NA SUCCINATE PF 100 MG IJ SOLR
100.0000 mg | Freq: Three times a day (TID) | INTRAMUSCULAR | Status: DC
  Administered 2020-08-08 – 2020-08-11 (×9): 100 mg via INTRAVENOUS
  Filled 2020-08-08 (×10): qty 2

## 2020-08-08 MED ORDER — PROSOURCE TF PO LIQD
45.0000 mL | Freq: Three times a day (TID) | ORAL | Status: DC
  Administered 2020-08-08 – 2020-08-10 (×6): 45 mL
  Filled 2020-08-08 (×5): qty 45

## 2020-08-08 MED ORDER — VITAL HIGH PROTEIN PO LIQD
1000.0000 mL | ORAL | Status: DC
  Administered 2020-08-08: 1000 mL

## 2020-08-08 MED ORDER — LEVOTHYROXINE SODIUM 100 MCG/5ML IV SOLN
50.0000 ug | Freq: Every day | INTRAVENOUS | Status: DC
  Administered 2020-08-09 – 2020-08-14 (×6): 50 ug via INTRAVENOUS
  Filled 2020-08-08 (×6): qty 5

## 2020-08-08 MED ORDER — LIOTHYRONINE SODIUM 5 MCG PO TABS
5.0000 ug | ORAL_TABLET | Freq: Three times a day (TID) | ORAL | Status: AC
  Administered 2020-08-08 – 2020-08-09 (×3): 5 ug
  Filled 2020-08-08 (×3): qty 1

## 2020-08-08 MED ORDER — LIOTHYRONINE SODIUM 5 MCG PO TABS: 2.5000 ug | ORAL_TABLET | Freq: Three times a day (TID) | ORAL | Status: DC

## 2020-08-08 MED ORDER — CHLORHEXIDINE GLUCONATE CLOTH 2 % EX PADS
6.0000 | MEDICATED_PAD | Freq: Every day | CUTANEOUS | Status: DC
  Administered 2020-08-08 – 2020-08-12 (×4): 6 via TOPICAL

## 2020-08-08 MED ORDER — PROPOFOL 1000 MG/100ML IV EMUL
0.0000 ug/kg/min | INTRAVENOUS | Status: DC
  Administered 2020-08-08 (×2): 20 ug/kg/min via INTRAVENOUS
  Administered 2020-08-09: 15 ug/kg/min via INTRAVENOUS
  Administered 2020-08-09: 20 ug/kg/min via INTRAVENOUS
  Administered 2020-08-09 – 2020-08-10 (×2): 15 ug/kg/min via INTRAVENOUS
  Filled 2020-08-08 (×4): qty 100

## 2020-08-08 MED ORDER — LIOTHYRONINE SODIUM 5 MCG PO TABS
2.5000 ug | ORAL_TABLET | Freq: Three times a day (TID) | ORAL | Status: DC
  Administered 2020-08-09 – 2020-08-10 (×5): 2.5 ug
  Filled 2020-08-08 (×7): qty 1

## 2020-08-08 MED ORDER — SODIUM CHLORIDE 0.9 % IV SOLN
2.0000 g | INTRAVENOUS | Status: AC
  Administered 2020-08-09 – 2020-08-10 (×2): 2 g via INTRAVENOUS
  Filled 2020-08-08 (×2): qty 20

## 2020-08-08 NOTE — Progress Notes (Signed)
  Echocardiogram 2D Echocardiogram has been performed.  Leta Jungling M 08/08/2020, 10:42 AM

## 2020-08-08 NOTE — Progress Notes (Signed)
Nutrition Follow-up  DOCUMENTATION CODES:   Not applicable  INTERVENTION:   Initiate tube feeding via OG tube: Vital High Protein at 40 ml/h (960 ml per day) Prosource TF 45 ml TID  Provides 1080 kcal (1465 kcal total with propofol), 117 gm protein, 803 ml free water daily  NUTRITION DIAGNOSIS:   Inadequate oral intake related to inability to eat as evidenced by NPO status.  GOAL:   Provide needs based on ASPEN/SCCM guidelines  MONITOR:   Vent status, Labs, Weight trends, Skin  REASON FOR ASSESSMENT:   Consult Assessment of nutrition requirement/status  ASSESSMENT:   74 yo female admitted with SOB, required intubation shortly after admission. Work-up revealed small PE, suspected COPD exacerbation. PMH includes tobacco abuse.   CT chest 9/6 revealed pericardial effusion and small bilateral effusions with atelectasis. TSH elevated. T4 low. 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 Levothyroxine and T3 supplementation began today. Pericardial effusion suspected to be r/t severe hypothyroidism. Unable to extubate today. Received MD Consult for TF initiation and management. OG tube in place.   Patient remains intubated on ventilator support MV: 6.4 L/min Temp (24hrs), Avg:97.3 F (36.3 C), Min:95.9 F (35.5 C), Max:97.7 F (36.5 C)  Propofol: 14.6 ml/hr providing 385 kcal from lipid.  Labs reviewed.  CBG: [id number]  Medications reviewed and include colace, solu-cortef, novolog, miralax, levophed, propofol.  I/O + 1,534 ml since admission UOP 1,530 ml x 24 hours  Weight up to 103.1 kg today.  Diet Order:   Diet Order            Diet NPO time specified  Diet effective now                 EDUCATION NEEDS:   Not appropriate for education at this time  Skin:  Skin Assessment: Reviewed RN Assessment (MASD to hip & abdomen)  Last BM:  9/4  Height:   Ht Readings from Last 1 Encounters:  08/05/20 5\' 5"  (1.651 m)    Weight:   Wt Readings from Last 1  Encounters:  08/08/20 103.1 kg    Ideal Body Weight:  56.8 kg  BMI:  Body mass index is 37.82 kg/m.  Estimated Nutritional Needs:   Kcal:  1105-1405  Protein:  >/= 113 gm  Fluid:  >/= 1.5 L    10/08/20, RD, LDN, CNSC Please refer to Amion for contact information.

## 2020-08-08 NOTE — Progress Notes (Addendum)
ANTICOAGULATION CONSULT NOTE - Follow Up Consult  Pharmacy Consult for Heparin Indication: pulmonary embolus  No Known Allergies  Patient Measurements: Height: 5\' 5"  (165.1 cm) Weight: 103.1 kg (227 lb 4.7 oz) IBW/kg (Calculated) : 57 Heparin Dosing Weight: 103kg  Vital Signs: Temp: 97.7 F (36.5 C) (09/07 1500) BP: 138/64 (09/07 1500) Pulse Rate: 56 (09/07 1500)  Labs: Recent Labs    08/05/20 1700 08/06/20 0354 08/06/20 0701 08/06/20 0701 08/06/20 1957 08/06/20 1957 08/07/20 0350 08/07/20 0351 08/07/20 0925 08/07/20 0934 08/07/20 1752 08/07/20 1752 08/08/20 0441 08/08/20 0853 08/08/20 1354  HGB  --    < > 17.4*   < > 16.6*   < >  --  15.8*   < >  --  17.7*   < > 15.4* 17.0*  --   HCT  --    < > 61.0*   < > 56.3*   < >  --  53.7*   < >  --  52.0*  --  51.8* 50.0*  --   PLT  --   --  135*   < > 158  --   --  168  --   --   --   --  171  --   --   APTT 37*  --   --   --   --   --   --   --   --   --   --   --   --   --   --   LABPROT 13.5  --   --   --   --   --   --   --   --   --   --   --   --   --   --   INR 1.1  --   --   --   --   --   --   --   --   --   --   --   --   --   --   HEPARINUNFRC  --    < >  --    < > 0.65   < > 0.71*  --   --   --   --   --  0.79*  --  0.62  CREATININE  --   --  1.47*  --   --   --   --   --   --  1.90*  --   --  1.92*  --   --    < > = values in this interval not displayed.    Estimated Creatinine Clearance: 29.7 mL/min (A) (by C-G formula based on SCr of 1.92 mg/dL (H)).   Assessment: 74 years of age who presented to the ED with shortness of breath and found to have small pulmonary embolism.   Heparin level at 1354 is  (0.62) on gtt at 1000 units/hr. Was supratherapeutic on higher dose. No bleeding noted and CBC remains stable at this time.   Goal of Therapy:  Heparin level 0.3-0.7 units/ml Monitor platelets by anticoagulation protocol: Yes   Plan:  Continue heparin at 1000 units/hr Ordering confirmatory HL at 2200  tonight  Monitor for signs and symptoms of bleeding Daily CBC and HL   71, PharmD, [facility] Pharmacy Resident [phone] 08/08/2020 3:39 PM

## 2020-08-08 NOTE — Progress Notes (Signed)
ANTICOAGULATION CONSULT NOTE - Follow Up Consult  Pharmacy Consult for Heparin Indication: pulmonary embolus  No Known Allergies  Patient Measurements: Height: 5\' 5"  (165.1 cm) Weight: 100.4 kg (221 lb 7.2 oz) IBW/kg (Calculated) : 57 Heparin Dosing Weight: 68kg  Vital Signs: Temp: 97.2 F (36.2 C) (09/07 0430) BP: 103/46 (09/07 0400) Pulse Rate: 55 (09/07 0430)  Labs: Recent Labs     0000 08/05/20 1505 08/05/20 1700 08/06/20 0354 08/06/20 0701 08/06/20 0701 08/06/20 1957 08/06/20 1957 08/07/20 0350 08/07/20 0351 08/07/20 0351 08/07/20 0925 08/07/20 0925 08/07/20 0934 08/07/20 1752 08/08/20 0441  HGB  --  16.8*  --    < > 17.4*   < > 16.6*   < >  --  15.8*   < > 17.7*   < >  --  17.7* 15.4*  HCT  --  59.5*  --    < > 61.0*   < > 56.3*   < >  --  53.7*   < > 52.0*  --   --  52.0* 51.8*  PLT   < > 186  --   --  135*   < > 158  --   --  168  --   --   --   --   --  171  APTT  --   --  37*  --   --   --   --   --   --   --   --   --   --   --   --   --   LABPROT  --   --  13.5  --   --   --   --   --   --   --   --   --   --   --   --   --   INR  --   --  1.1  --   --   --   --   --   --   --   --   --   --   --   --   --   HEPARINUNFRC  --   --   --    < >  --    < > 0.65  --  0.71*  --   --   --   --   --   --  0.79*  CREATININE   < > 1.66*  --   --  1.47*  --   --   --   --   --   --   --   --  1.90*  --  1.92*  TROPONINIHS  --  19*  --   --   --   --   --   --   --   --   --   --   --   --   --   --    < > = values in this interval not displayed.    Estimated Creatinine Clearance: 29.3 mL/min (A) (by C-G formula based on SCr of 1.92 mg/dL (H)).   Assessment: 74 years of age who presented to the ED with shortness of breath and found to have small pulmonary embolism.   Heparin still elevated (0.79) on gtt at 1100 units/hr. No bleeding noted.  Goal of Therapy:  Heparin level 0.3-0.7 units/ml Monitor platelets by anticoagulation protocol: Yes   Plan:   Decrease Heparin to 1000 units/hr Will f/u 8 hr  heparin level  Christoper Fabian, PharmD, BCPS Please see amion for complete clinical pharmacist phone list 08/08/2020, 5:30 AM

## 2020-08-08 NOTE — Progress Notes (Addendum)
Progress Note  Patient Name: Miranda Cohen Date of Encounter: 08/08/2020  [facility] Cardiologist: Fransico Him, MD   Subjective   Intubated and sedated  Inpatient Medications    Scheduled Meds: . chlorhexidine gluconate (MEDLINE KIT)  15 mL Mouth Rinse BID  . Chlorhexidine Gluconate Cloth  6 each Topical Q0600  . docusate  100 mg Per Tube BID  . insulin aspart  0-9 Units Subcutaneous Q4H  . ipratropium-albuterol  3 mL Nebulization BID  . mouth rinse  15 mL Mouth Rinse 10 times per day  . nicotine  21 mg Transdermal Daily  . pantoprazole (PROTONIX) IV  40 mg Intravenous Daily  . polyethylene glycol  17 g Per Tube Daily  . predniSONE  40 mg Per Tube Q breakfast   Continuous Infusions: . sodium chloride Stopped (08/07/20 0905)  . calcium gluconate    . ceFEPime (MAXIPIME) IV Stopped (08/07/20 2218)  . fentaNYL infusion INTRAVENOUS 175 mcg/hr (08/08/20 0600)  . heparin 1,000 Units/hr (08/08/20 0600)  . norepinephrine (LEVOPHED) Adult infusion 2 mcg/min (08/08/20 0600)  . propofol (DIPRIVAN) infusion 40 mcg/kg/min (08/08/20 0600)   PRN Meds: albuterol, fentaNYL   Vital Signs    Vitals:   08/08/20 0530 08/08/20 0600 08/08/20 0630 08/08/20 0700  BP:  (!) 106/51  (!) 100/47  Pulse: (!) 54 69 (!) 57 (!) 56  Resp: 14 14 14 14   Temp: (!) 97.2 F (36.2 C) (!) 97.2 F (36.2 C) (!) 97.2 F (36.2 C) (!) 97.2 F (36.2 C)  TempSrc:      SpO2: 100% 97% 98% 99%  Weight:    103.1 kg  Height:        Intake/Output Summary (Last 24 hours) at 08/08/2020 0757 Last data filed at 08/08/2020 0600 Gross per 24 hour  Intake 1393.9 ml  Output 1530 ml  Net -136.1 ml   Last 3 Weights 08/08/2020 08/06/2020 08/05/2020  Weight (lbs) 227 lb 4.7 oz 221 lb 7.2 oz 150 lb  Weight (kg) 103.1 kg 100.45 kg 68.04 kg      Telemetry    NSR 50-60s - Personally Reviewed  ECG    No new ECG - Personally Reviewed  Physical Exam   GEN: Intubated and sedated Neck: No JVD appreciated, but  difficult to assess given habitus Cardiac: RRR, no murmurs Respiratory: Clear to auscultation in anterior fields GI: Soft, nontender MS: No edema Neuro:  Nonfocal  Psych: Unable to assess  Labs    High Sensitivity Troponin:   Recent Labs  Lab 08/05/20 1505  TROPONINIHS 19*      Chemistry Recent Labs  Lab 08/05/20 1505 08/06/20 0354 08/06/20 0701 08/07/20 0925 08/07/20 0934 08/07/20 1752 08/08/20 0441  NA 136   < > 138   < > 137 138 138  K 5.0   < > 4.7   < > 4.9 5.0 5.0  CL 94*  --  95*  --  99  --  100  CO2 33*  --  30  --  28  --  27  GLUCOSE 95  --  158*  --  102*  --  108*  BUN 30*  --  28*  --  37*  --  45*  CREATININE 1.66*  --  1.47*  --  1.90*  --  1.92*  CALCIUM 8.9  --  8.7*  --  8.2*  --  8.6*  PROT 6.7  --   --   --  5.7*  --   --  ALBUMIN 3.3*  --   --   --  2.6*  --   --   AST 14*  --   --   --  10*  --   --   ALT 12  --   --   --  12  --   --   ALKPHOS 66  --   --   --  51  --   --   BILITOT 1.3*  --   --   --  1.3*  --   --   GFRNONAA 30*  --  34*  --  25*  --  25*  GFRAA 34*  --  40*  --  29*  --  29*  ANIONGAP 9  --  13  --  10  --  11   < > = values in this interval not displayed.     Hematology Recent Labs  Lab 08/06/20 1957 08/06/20 1957 08/07/20 0351 08/07/20 0351 08/07/20 0925 08/07/20 1752 08/08/20 0441  WBC 9.0  --  8.0  --   --   --  10.2  RBC 5.92*  --  5.69*  --   --   --  5.60*  HGB 16.6*   < > 15.8*   < > 17.7* 17.7* 15.4*  HCT 56.3*   < > 53.7*   < > 52.0* 52.0* 51.8*  MCV 95.1  --  94.4  --   --   --  92.5  MCH 28.0  --  27.8  --   --   --  27.5  MCHC 29.5*  --  29.4*  --   --   --  29.7*  RDW 15.8*  --  16.0*  --   --   --  16.0*  PLT 158  --  168  --   --   --  171   < > = values in this interval not displayed.    BNP Recent Labs  Lab 08/05/20 1505  BNP 908.3*     DDimer  Recent Labs  Lab 08/05/20 1700  DDIMER >20.00*     Radiology    CT CHEST WO CONTRAST  Result Date: 08/07/2020 CLINICAL DATA:   Acute respiratory failure. EXAM: CT CHEST WITHOUT CONTRAST TECHNIQUE: Multidetector CT imaging of the chest was performed following the standard protocol without IV contrast. COMPARISON:  August 05, 2020. FINDINGS: Cardiovascular: Atherosclerosis of thoracic aorta is noted without aneurysm formation. Moderate size pericardial effusion is noted. Mild coronary artery calcifications are noted. Mediastinum/Nodes: Endotracheal tube is in good position. Nasogastric tube is seen passing through esophagus and into stomach. Stable diffuse enlargement of thyroid gland is noted. No adenopathy is noted. Lungs/Pleura: No pneumothorax is noted. Mild emphysematous disease is noted in the upper lobes bilaterally. Small pleural effusions are noted with adjacent atelectasis or infiltrates of both lower lobes. Upper Abdomen: Cholelithiasis is noted. Musculoskeletal: No chest wall mass or suspicious bone lesions identified. IMPRESSION: 1. Moderate size pericardial effusion is noted. 2. Mild coronary artery calcifications are noted suggesting coronary artery disease. 3. Small pleural effusions are noted with adjacent atelectasis or infiltrates of both lower lobes. 4. Endotracheal and nasogastric tubes are in good position. 5. Cholelithiasis. 6. Stable diffuse enlargement of thyroid gland is noted. 7. Emphysema and aortic atherosclerosis. Aortic Atherosclerosis (ICD10-I70.0) and Emphysema (ICD10-J43.9). Electronically Signed   By: Marijo Conception M.D.   On: 08/07/2020 11:01   ECHOCARDIOGRAM COMPLETE  Result Date: 08/06/2020    ECHOCARDIOGRAM REPORT  Patient Name:   Miranda Cohen Date of Exam: 08/06/2020 Medical Rec #:  [id number]       Height:       65.0 in Accession #:    [id number]      Weight:       150.0 lb Date of Birth:  Mar 21, 1946        BSA:          1.750 m Patient Age:    74 years        BP:           71/41 mmHg Patient Gender: F               HR:           64 bpm. Exam Location:  Inpatient Procedure: 2D Echo, Cardiac  Doppler and Color Doppler Indications:    Pulmonary embolus  History:        Patient has no prior history of Echocardiogram examinations.                 Risk Factors:Current Smoker.  Sonographer:    Clayton Lefort RDCS (AE) Referring Phys: [phone] Etta Quill  Sonographer Comments: Patient is morbidly obese and echo performed with patient supine and on artificial respirator. IMPRESSIONS  1. Left ventricular ejection fraction, by estimation, is 70 to 75%. The left ventricle has hyperdynamic function. The left ventricle has no regional wall motion abnormalities. There is severe concentric left ventricular hypertrophy. Left ventricular diastolic parameters are indeterminate.  2. Right ventricular systolic function was not well visualized. The right ventricular size is not well visualized.  3. Large pericardial effusion. The pericardial effusion is posterior to the left ventricle.  4. The mitral valve is grossly normal. Trivial mitral valve regurgitation.  5. The aortic valve is tricuspid. Aortic valve regurgitation is not visualized. No aortic stenosis is present. Conclusion(s)/Recommendation(s): Difficult images, RV not well visualized but appears grossly normal on subcostal images. There is a large pericardial effusion, with largest dimension posterior to LV measured as 2.55 cm. No clear respiratory variation, but patient is on ventilator. No clear RA or RV diastolic collapse seen. No apparent tamponade by echo parameters, but recommend clinical correlation. FINDINGS  Left Ventricle: Left ventricular ejection fraction, by estimation, is 70 to 75%. The left ventricle has hyperdynamic function. The left ventricle has no regional wall motion abnormalities. The left ventricular internal cavity size was small. There is severe concentric left ventricular hypertrophy. Left ventricular diastolic parameters are indeterminate. Right Ventricle: The right ventricular size is not well visualized. Right vetricular wall thickness was  not assessed. Right ventricular systolic function was not well visualized. Left Atrium: Left atrial size was normal in size. Right Atrium: Right atrial size was normal in size. Pericardium: A large pericardial effusion is present. The pericardial effusion is posterior to the left ventricle. Mitral Valve: The mitral valve is grossly normal. Trivial mitral valve regurgitation. MV peak gradient, 6.6 mmHg. The mean mitral valve gradient is 2.0 mmHg. Tricuspid Valve: The tricuspid valve is grossly normal. Tricuspid valve regurgitation is trivial. Aortic Valve: The aortic valve is tricuspid. Aortic valve regurgitation is not visualized. No aortic stenosis is present. Aortic valve mean gradient measures 5.0 mmHg. Aortic valve peak gradient measures 7.1 mmHg. Aortic valve area, by VTI measures 2.89 cm. Pulmonic Valve: The pulmonic valve was grossly normal. Pulmonic valve regurgitation is mild. Aorta: The aortic root and ascending aorta are structurally normal, with no evidence of dilitation. Venous: IVC assessment for right  atrial pressure unable to be performed due to mechanical ventilation. IAS/Shunts: No atrial level shunt detected by color flow Doppler.  LEFT VENTRICLE PLAX 2D LVIDd:         3.50 cm  Diastology LVIDs:         2.00 cm  LV e' lateral:   4.28 cm/s LV PW:         1.80 cm  LV E/e' lateral: 22.6 LV IVS:        1.80 cm  LV e' medial:    3.38 cm/s LVOT diam:     1.90 cm  LV E/e' medial:  28.6 LV SV:         96 LV SV Index:   55 LVOT Area:     2.84 cm  RIGHT VENTRICLE            IVC RV Basal diam:  3.10 cm    IVC diam: 1.90 cm RV S prime:     9.23 cm/s TAPSE (M-mode): 1.2 cm LEFT ATRIUM             Index       RIGHT ATRIUM           Index LA diam:        3.30 cm 1.89 cm/m  RA Area:     14.60 cm LA Vol (A2C):   43.0 ml 24.56 ml/m RA Volume:   33.20 ml  18.97 ml/m LA Vol (A4C):   37.0 ml 21.14 ml/m LA Biplane Vol: 41.4 ml 23.65 ml/m  AORTIC VALVE AV Area (Vmax):    2.75 cm AV Area (Vmean):   2.71 cm AV  Area (VTI):     2.89 cm AV Vmax:           133.00 cm/s AV Vmean:          101.000 cm/s AV VTI:            0.332 m AV Peak Grad:      7.1 mmHg AV Mean Grad:      5.0 mmHg LVOT Vmax:         129.00 cm/s LVOT Vmean:        96.600 cm/s LVOT VTI:          0.338 m LVOT/AV VTI ratio: 1.02  AORTA Ao Root diam: 3.30 cm MITRAL VALVE MV Area (PHT): 2.29 cm     SHUNTS MV Peak grad:  6.6 mmHg     Systemic VTI:  0.34 m MV Mean grad:  2.0 mmHg     Systemic Diam: 1.90 cm MV Vmax:       1.28 m/s MV Vmean:      65.9 cm/s MV Decel Time: 331 msec MV E velocity: 96.70 cm/s MV A velocity: 129.00 cm/s MV E/A ratio:  0.75 Buford Dresser MD Electronically signed by Buford Dresser MD Signature Date/Time: 08/06/2020/3:13:56 PM    Final    VAS [place] LOWER EXTREMITY VENOUS (DVT)  Result Date: 08/06/2020  Lower Venous DVTStudy Indications: Pulmonary embolism.  Limitations: Body habitus, line and ventilator. Comparison Study: No prior study on file Performing Technologist: Sharion Dove RVS  Examination Guidelines: A complete evaluation includes B-mode imaging, spectral Doppler, color Doppler, and power Doppler as needed of all accessible portions of each vessel. Bilateral testing is considered an integral part of a complete examination. Limited examinations for reoccurring indications may be performed as noted. The reflux portion of the exam is performed with the patient in reverse Trendelenburg.  +---------+---------------+---------+-----------+----------+--------------+ RIGHT  CompressibilityPhasicitySpontaneityPropertiesThrombus Aging +---------+---------------+---------+-----------+----------+--------------+ CFV                                                   Not visualized +---------+---------------+---------+-----------+----------+--------------+ SFJ                                                   Not visualized +---------+---------------+---------+-----------+----------+--------------+ FV Prox   Full           Yes      Yes                                 +---------+---------------+---------+-----------+----------+--------------+ FV Mid   Full                                                        +---------+---------------+---------+-----------+----------+--------------+ FV DistalFull                                                        +---------+---------------+---------+-----------+----------+--------------+ PFV                                                   Not visualized +---------+---------------+---------+-----------+----------+--------------+ POP                     Yes      Yes                                 +---------+---------------+---------+-----------+----------+--------------+ PTV      Full                                                        +---------+---------------+---------+-----------+----------+--------------+ PERO     Full                                                        +---------+---------------+---------+-----------+----------+--------------+   +---------+---------------+---------+-----------+----------+--------------+ LEFT     CompressibilityPhasicitySpontaneityPropertiesThrombus Aging +---------+---------------+---------+-----------+----------+--------------+ CFV      Full           Yes      Yes                                 +---------+---------------+---------+-----------+----------+--------------+ SFJ      Full                                                        +---------+---------------+---------+-----------+----------+--------------+  FV Prox  Full                                                        +---------+---------------+---------+-----------+----------+--------------+ FV Mid   Full                                                        +---------+---------------+---------+-----------+----------+--------------+ FV DistalFull                                                         +---------+---------------+---------+-----------+----------+--------------+ PFV      Full                                                        +---------+---------------+---------+-----------+----------+--------------+ POP                     Yes      Yes                                 +---------+---------------+---------+-----------+----------+--------------+ PTV      Full                                                        +---------+---------------+---------+-----------+----------+--------------+ PERO     Full                                                        +---------+---------------+---------+-----------+----------+--------------+     Summary: RIGHT: - There is no evidence of deep vein thrombosis in the lower extremity. However, portions of this examination were limited- see technologist comments above.  LEFT: - There is no evidence of deep vein thrombosis in the lower extremity. However, portions of this examination were limited- see technologist comments above.  *See table(s) above for measurements and observations.    Preliminary     Cardiac Studies   Echo 08/06/20: 1. Left ventricular ejection fraction, by estimation, is 70 to 75%. The  left ventricle has hyperdynamic function. The left ventricle has no  regional wall motion abnormalities. There is severe concentric left  ventricular hypertrophy. Left ventricular  diastolic parameters are indeterminate.  2. Right ventricular systolic function was not well visualized. The right  ventricular size is not well visualized.  3. Large pericardial effusion. The pericardial effusion is posterior to  the left ventricle.  4. The mitral valve is grossly normal. Trivial mitral valve  regurgitation.  5. The  aortic valve is tricuspid. Aortic valve regurgitation is not  visualized. No aortic stenosis is present.   Conclusion(s)/Recommendation(s): Difficult images, RV not well visualized  but  appears grossly normal on subcostal images. There is a large  pericardial effusion, with largest dimension posterior to LV measured as  2.55 cm. No clear respiratory variation,  but patient is on ventilator. No clear RA or RV diastolic collapse seen.  No apparent tamponade by echo parameters, but recommend clinical  correlation.   Patient Profile     74 y.o. female with PMHx tobacco abuse presents with acute hypoxic respiratory failure  Assessment & Plan    Pericardial effusion: Large posterior effusion on echo, no evidence of tamponade.  Suspect secondary to hypothyroidism, TSH 63 -Would expect effusion to improve with treatment of hypothyroidism.  Will check limited echo to monitor for worsening effusion.  If worsening effusion on anticoagulation or any evidence of tamponade physiology, would consult cardiac surgery for pericardial window.  Not a candidate for pericardiocentesis given posterior effusion  PE: small RLL PE, on heparin gtt   For questions or updates, please contact [place] Please consult [email or web address] for contact info under        Signed, Donato Heinz, MD  08/08/2020, 7:57 AM

## 2020-08-08 NOTE — Progress Notes (Signed)
Patient sat straight up in bed without warning and looking alert and startled. She had been almost unresponsive most of night with only response to pain. IV pulled out and medication given to calm patient down. Resting comfortably now.   Noe Gens, RN

## 2020-08-08 NOTE — TOC Initial Note (Signed)
Transition of Care [facility]) - Initial/Assessment Note    Patient Details  Name: Miranda Cohen MRN: [id number] Date of Birth: 1946/02/18  [facility]) CM/SW Contact:    Lockie Pares, RN Phone Number: 08/08/2020, 10:17 AM  Clinical Narrative:                 Patient from home admitted with COPD, shortness of breath. Will follow for needs, likely will need HH and Home O2  Expected Discharge Plan: Home w Home Health Services Barriers to Discharge: Continued Medical Work up   Patient Goals and CMS Choice        Expected Discharge Plan and Services Expected Discharge Plan: Home w Home Health Services                                              Prior Living Arrangements/Services     Patient language and need for interpreter reviewed:: Yes        Need for Family Participation in Patient Care: Yes (Comment) Care giver support system in place?: Yes (comment)   Criminal Activity/Legal Involvement Pertinent to Current Situation/Hospitalization: No - Comment as needed  Activities of Daily Living      Permission Sought/Granted                  Emotional Assessment   Attitude/Demeanor/Rapport: Intubated (Following Commands or Not Following Commands) Affect (typically observed): Unable to Assess   Alcohol / Substance Use: Tobacco Use Psych Involvement: No (comment)  Admission diagnosis:  Pericardial effusion [I31.3] COPD with acute exacerbation (HCC) [J44.1] Acute respiratory failure with hypoxia and hypercapnia (HCC) [J96.01, J96.02] Single subsegmental pulmonary embolism without acute cor pulmonale (HCC) [I26.93] Patient Active Problem List   Diagnosis Date Noted  . Coronary artery calcification   . Acute pulmonary embolism (HCC) 08/05/2020  . Acute respiratory failure with hypoxia and hypercapnia (HCC) 08/05/2020  . Pericardial effusion 08/05/2020  . COPD (chronic obstructive pulmonary disease) (HCC) 08/05/2020  . Renal insufficiency  08/05/2020   PCP:  Patient, No Pcp Per Pharmacy:   [facility] [address] [place] - [address] [place] [address] Phone: [phone] Fax: [phone]     Social Determinants of Health (SDOH) Interventions    Readmission Risk Interventions No flowsheet data found.

## 2020-08-08 NOTE — Progress Notes (Addendum)
NAME:  Miranda MapleJudith A Cohen, MRN:  [id number], DOB:  03/09/1946, LOS: 3 ADMISSION DATE:  08/05/2020, CONSULTATION DATE:  08/08/20 REFERRING MD:  Julian ReilGardner, CHIEF COMPLAINT:  Hypercapnic respiratory failure   Brief History   [age] y.o. F with PMH of tobacco use who presented with exertional shortness of breath, cough, diarrhea, fatigue since 8/1.  Work-up significant for small PE, hypercarbia, likely COPD exacerbation.  Bipap was initiated, however hypercarbia failed to significantly improve and pt became more lethargic so PCCM consulted.  History of present illness   Miranda LabellaJudith Cohen is a 74 y.o. F with PMH of prolonged tobacco use, states has been smoking 1ppd for most of her life, who began feeling more dyspneic on exertion approximately one month ago. Over the last few days, she also developed diarrhea without nausea and vomiting  She presented to the ED several weeks ago, but left AMA.  She denied fevers, chills or significant leg edema  EMS reportedly found patient with oxygen sats in the 50%'s on RA, she was brought to the ED and placed on nasal cannula initially.  CXR with LLL infiltrate vs atelectasis, Covid-19 negative, BNP 900, Procal negative. CT chest with small RLL PE, cardiomegaly and moderate pericardial effusion, evidence of PAH and COPD.   ABG results pH 7.1, pCO2 115, pO2 87 bicarb 35.  She was admitted to the hospitalists and started on Bipap, heparin, and inhalers. Pt's repeat ABG showed no significant improvement and she remained lethargic, so PCCM consulted.  Past Medical History  Tobacco abuse  Significant Hospital Events   9/4 Admit to Hospitalists, ICU txfr  Consults:  PCCM  Procedures:  9/5 ETT 9/5 Right femoral artery cathter  Significant Diagnostic Tests:  9/4 CXR>>The lower portion of the left lung and left cardiac border are obscured by dense consolidation or pleural effusion. 9/4 CT chest>>small RLL PE, cardiomegaly and moderate pericardial effusion 9/5 ECHO >> LVEF  70-75%, large pericardial effusion 9/5 LE dopplers >> negative for DVT bilaterally 9/6 CT chest wo contrast >> moderate pericardial effusion, small pleural effusions b/l, stable diffuse enlargement of thyroid gland 9/7 ABI >> moderate RLE and mild LLE arterial disease  Micro Data:  9/4 Sars-CoV-2>>negative 9/4 BCx2>> 9/4 MRSA screen>>negative 9/4 repeat covid>>negative 9/5 BCx2>> 9/5 Respiratory culture >> moderate GPC in clusters, few GNR  Antimicrobials:  9/5 Cefepime>>9/7 9/5 Vancomycin>9/5  Interim history/subjective:  No acute events since transferring to the ICU. Remains sedated and on the ventilator. Is currently requiring levophed support.   Will place consult for dietitian to start tube feeds as patient likely not going to be extubated today.  Objective   Blood pressure 138/64, pulse (!) 56, temperature 97.7 F (36.5 C), resp. rate 14, height 5\' 5"  (1.651 m), weight 103.1 kg, SpO2 99 %.    Vent Mode: PRVC FiO2 (%):  [40 %-70 %] 40 % Set Rate:  [14 bmp] 14 bmp Vt Set:  [450 mL] 450 mL PEEP:  [10 cmH20-12 cmH20] 12 cmH20 Plateau Pressure:  [20 cmH20-24 cmH20] 23 cmH20   Intake/Output Summary (Last 24 hours) at 08/08/2020 1505 Last data filed at 08/08/2020 1500 Gross per 24 hour  Intake 1635.29 ml  Output 1215 ml  Net 420.29 ml   Filed Weights   08/05/20 1520 08/06/20 0250 08/08/20 0700  Weight: 68 kg 100.4 kg 103.1 kg    General: elderly, chronically ill appearing female, on vent, NAD. HEENT: mucous membranes pink and moist, anicteric. Neuro: sedated CV: normal rate and regular rhythm, s1s2 heard, no m/r/g  PULM: diminished, course breath sounds bilaterally. GI: soft, non-distended abdomen. +BS Extremities: bilateral feet cold to the touch, decreased pedal pulses. Skin: no rashes or lesions, skin on toes appears mottled bilaterally.   Resolved Hospital Problem list     Assessment & Plan:  Myxedema Coma 2/2 severe hypothyroidism Likely provoked by  hypercapnia in the setting of untreated/undiagnosed COPD. Patient's son mentioned that patient was lethargic/somnolent for the past couple of weeks PTA. During ICU stay, patient has been bradycardic (in the 50s), hypothermic (low of 96.8), hypotensive, and hypercapnic. Found to have large pericardial effusion on CT chest and ECHO. CT chest also showing bilateral pleural effusions. -TSH 63 -Free T4 0.4 -Free T3 0.9 -TPO Ab pending -AM cortisol 4.5 (although patient has been receiving prednisone 40mg  daily for treatment of COPD) -Start IV levothyroxine daily for 1 day, then daily from tomorrow -Start IV liothyronine q8h for 1 day, then 2.10mcg q8h for next 4 days -D/C prednisone 40mg  -Start IV hydrocortisone 100mg  q8h for potential underlying adrenal insufficiency -Measure free T4 and free T3 every 1-2 days to monitor for improvement with therapy -Monitor for cardiac complications while on thyroid replacement therapy -Passive rewarming with blankets -Avoid active rewarming to prevent peripheral vasodilation and worsening hypotension  Acute Hypoxemic and Hypercapnic Respiratory Failure  In setting of likely undiagnosed COPD, large pericardial effusion, and L sided atelectesis vs infiltrate - Maintain full vent support with SAT/SBT as tolerated - titrate Vent setting to maintain SpO2 greater than or equal to 90%.  - Follow chest x-ray, ABG prn. - ABG on 9/7 showing pH 7.41 / pO2 96 / PCO2 47.2 / HCO3 30.9 - Patient satting 97-98% on PRVC with FiO2 50% and PEEP 12 with significant improvement of pO2 -will try to wean FiO2 down to 40% today - Bronchial hygiene and RT/bronchodilator protocol. - Received 4 days of steroid therapy thus far, will transition to 100mg  IV hydrocortisone for treatment of possible underlying adrenal insufficiency in the setting of myxedema coma - continue scheduled duonebs - CT chest wo contrast on 9/6 - did not show any findings concerning for lung  malignancy -blood cultures - no growth in 2 days -sputum cultures - moderate GPC in clusters, few GNR, no staph aureus or pseudomonas seen -received 3 days of cefepime, will transition to ceftriaxone for 2 days as of tomorrow  Pericardial effusion, likely 2/2 severe hypothyroidism - Echo showing LVEF 70-75%, large posterior pericardial effusion, no evidence of tamponade - Consulted cardiology, greatly appreciate recs - will monitor for improvement with treatment of hypothyroidism - Limited ECHO on 9/7 not showing stable pericardial effusion, mostly posterior -if effusion worsens or evidence of tamponade arises, will consider consulting cardiac surgery for pericardial window -Would need to hold heparin ppx if pericardial window is pursued  Small, RLL PE -ECHO showing no right heart strain -continue heparin gtt  -LE dopplers negative  PAD -longtime cigarette smoker -preliminary ABI showing moderate RLE arterial disease and mild LLE arterial disease  Acute Kidney Injury Baseline creatinine is unknown. However, creatinine is trending up (Cr 1.66 >> 1.47 >> 1.90 >> 1.92) -monitor BMP, UOP, and electrolytes  Polycythemia Likely secondary to chronic hypoxemia from underlying obstructive lung disease - Monitor CBC  Best practice:  Diet: NPO, will consult dietitian to start tube feeds Pain/Anxiety/Delirium protocol (if indicated): fentanyl gtt VAP protocol (if indicated): HOB 30 degrees, suction prn DVT prophylaxis: heparin GI prophylaxis: protonix Glucose control: SII Mobility: bed rest Code Status: full code Family Communication: family  updated, spoke with son on 9/6 Disposition: ICU  Labs   CBC: Recent Labs  Lab 08/05/20 1505 08/06/20 0354 08/06/20 0701 08/06/20 0701 08/06/20 1957 08/06/20 1957 08/07/20 0351 08/07/20 9030 08/07/20 1752 08/08/20 0441 08/08/20 0853  WBC 5.6  --  7.5  --  9.0  --  8.0  --   --  10.2  --   NEUTROABS 3.8  --   --   --   --   --   --    --   --   --   --   HGB 16.8*   < > 17.4*   < > 16.6*   < > 15.8* 17.7* 17.7* 15.4* 17.0*  HCT 59.5*   < > 61.0*   < > 56.3*   < > 53.7* 52.0* 52.0* 51.8* 50.0*  MCV 98.5  --  97.8  --  95.1  --  94.4  --   --  92.5  --   PLT 186  --  135*  --  158  --  168  --   --  171  --    < > = values in this interval not displayed.    Basic Metabolic Panel: Recent Labs  Lab 08/05/20 1505 08/06/20 0354 08/06/20 0701 08/06/20 0701 08/07/20 0925 08/07/20 0934 08/07/20 1752 08/08/20 0441 08/08/20 0853  NA 136   < > 138   < > 137 137 138 138 136  K 5.0   < > 4.7   < > 4.8 4.9 5.0 5.0 4.5  CL 94*  --  95*  --   --  99  --  100  --   CO2 33*  --  30  --   --  28  --  27  --   GLUCOSE 95  --  158*  --   --  102*  --  108*  --   BUN 30*  --  28*  --   --  37*  --  45*  --   CREATININE 1.66*  --  1.47*  --   --  1.90*  --  1.92*  --   CALCIUM 8.9  --  8.7*  --   --  8.2*  --  8.6*  --    < > = values in this interval not displayed.   GFR: Estimated Creatinine Clearance: 29.7 mL/min (A) (by C-G formula based on SCr of 1.92 mg/dL (H)). Recent Labs  Lab 08/05/20 1505 08/05/20 1505 08/05/20 1555 08/06/20 0701 08/06/20 1957 08/07/20 0351 08/08/20 0441  PROCALCITON <0.10  --   --   --   --   --   --   WBC 5.6   < >  --  7.5 9.0 8.0 10.2  LATICACIDVEN  --   --  1.3  --   --   --   --    < > = values in this interval not displayed.    Liver Function Tests: Recent Labs  Lab 08/05/20 1505 08/07/20 0934  AST 14* 10*  ALT 12 12  ALKPHOS 66 51  BILITOT 1.3* 1.3*  PROT 6.7 5.7*  ALBUMIN 3.3* 2.6*   No results for input(s): LIPASE, AMYLASE in the last 168 hours. No results for input(s): AMMONIA in the last 168 hours.  ABG    Component Value Date/Time   PHART 7.419 08/08/2020 0853   PCO2ART 47.2 08/08/2020 0853   PO2ART 96 08/08/2020 0853   HCO3 30.9 (H) 08/08/2020 0923  TCO2 32 08/08/2020 0853   O2SAT 98.0 08/08/2020 0853     Coagulation Profile: Recent Labs  Lab  08/05/20 1700  INR 1.1    Cardiac Enzymes: No results for input(s): CKTOTAL, CKMB, CKMBINDEX, TROPONINI in the last 168 hours.  HbA1C: Hgb A1c MFr Bld  Date/Time Value Ref Range Status  08/06/2020 07:00 AM 6.0 (H) 4.8 - 5.6 % Final    Comment:    (NOTE) Pre diabetes:          5.7%-6.4%  Diabetes:              >6.4%  Glycemic control for   <7.0% adults with diabetes     CBG: Recent Labs  Lab 08/07/20 1952 08/07/20 2342 08/08/20 0359 08/08/20 0845 08/08/20 1124  GLUCAP 105* 125* 106* 114* 87    Critical care time: 32 minutes     Merrilyn Puma, MD

## 2020-08-08 NOTE — Progress Notes (Signed)
ANTICOAGULATION CONSULT NOTE - Follow Up Consult  Pharmacy Consult for Heparin Indication: pulmonary embolus  No Known Allergies  Patient Measurements: Height: 5\' 5"  (165.1 cm) Weight: 103.1 kg (227 lb 4.7 oz) IBW/kg (Calculated) : 57 Heparin Dosing Weight: 103kg  Vital Signs: Temp: 97.5 F (36.4 C) (09/07 2251) BP: 129/60 (09/07 2200) Pulse Rate: 57 (09/07 2251)  Labs: Recent Labs    08/06/20 0701 08/06/20 0701 08/06/20 1957 08/07/20 0350 08/07/20 0351 08/07/20 0925 08/07/20 0934 08/07/20 1752 08/08/20 0441 08/08/20 0853 08/08/20 1354 08/08/20 2234  HGB 17.4*   < > 16.6*   < > 15.8*   < >  --  17.7* 15.4* 17.0*  --   --   HCT 61.0*   < > 56.3*  --  53.7*   < >  --  52.0* 51.8* 50.0*  --   --   PLT 135*   < > 158  --  168  --   --   --  171  --   --   --   HEPARINUNFRC  --    < > 0.65   < >  --   --   --   --  0.79*  --  0.62 0.54  CREATININE 1.47*  --   --   --   --   --  1.90*  --  1.92*  --   --   --    < > = values in this interval not displayed.    Estimated Creatinine Clearance: 29.7 mL/min (A) (by C-G formula based on SCr of 1.92 mg/dL (H)).   Assessment: 74 years of age who presented to the ED with shortness of breath and found to have small pulmonary embolism.   Heparin level therapeutic (0.54) on gtt at 1000 units/hr. No bleeding noted.  Goal of Therapy:  Heparin level 0.3-0.7 units/ml Monitor platelets by anticoagulation protocol: Yes   Plan:  Continue heparin at 1000 units/hr Daily CBC and HL   73, PharmD, BCPS Please see amion for complete clinical pharmacist phone list 08/08/2020 11:13 PM

## 2020-08-08 NOTE — Progress Notes (Signed)
ABI has been completed.   Preliminary results in CV Proc.   Blanch Media 08/08/2020 1:41 PM

## 2020-08-09 LAB — TRIGLYCERIDES: Triglycerides: 130 mg/dL (ref <150)

## 2020-08-09 LAB — BASIC METABOLIC PANEL
Anion gap: 12 (ref 5–15)
BUN: 54 mg/dL — ABNORMAL HIGH (ref 8–23)
CO2: 26 mmol/L (ref 22–32)
Calcium: 8.8 mg/dL — ABNORMAL LOW (ref 8.9–10.3)
Chloride: 98 mmol/L (ref 98–111)
Creatinine, Ser: 1.98 mg/dL — ABNORMAL HIGH (ref 0.44–1.00)
GFR calc Af Amer: 28 mL/min — ABNORMAL LOW (ref >60)
GFR calc non Af Amer: 24 mL/min — ABNORMAL LOW (ref >60)
Glucose, Bld: 134 mg/dL — ABNORMAL HIGH (ref 70–99)
Potassium: 4.4 mmol/L (ref 3.5–5.1)
Sodium: 136 mmol/L (ref 135–145)

## 2020-08-09 LAB — CBC
HCT: 49.8 % — ABNORMAL HIGH (ref 36.0–46.0)
Hemoglobin: 14.7 g/dL (ref 12.0–15.0)
MCH: 27.2 pg (ref 26.0–34.0)
MCHC: 29.5 g/dL — ABNORMAL LOW (ref 30.0–36.0)
MCV: 92.2 fL (ref 80.0–100.0)
Platelets: 153 10*3/uL (ref 150–400)
RBC: 5.4 MIL/uL — ABNORMAL HIGH (ref 3.87–5.11)
RDW: 15.8 % — ABNORMAL HIGH (ref 11.5–15.5)
WBC: 6.7 10*3/uL (ref 4.0–10.5)
nRBC: 0 % (ref 0.0–0.2)

## 2020-08-09 LAB — CULTURE, RESPIRATORY W GRAM STAIN: Culture: NORMAL

## 2020-08-09 LAB — GLUCOSE, CAPILLARY
Glucose-Capillary: 124 mg/dL — ABNORMAL HIGH (ref 70–99)
Glucose-Capillary: 151 mg/dL — ABNORMAL HIGH (ref 70–99)
Glucose-Capillary: 143 mg/dL — ABNORMAL HIGH (ref 70–99)
Glucose-Capillary: 118 mg/dL — ABNORMAL HIGH (ref 70–99)
Glucose-Capillary: 160 mg/dL — ABNORMAL HIGH (ref 70–99)
Glucose-Capillary: 131 mg/dL — ABNORMAL HIGH (ref 70–99)

## 2020-08-09 LAB — CK: Total CK: 61 U/L (ref 38–234)

## 2020-08-09 LAB — T3: T3, Total: 47 ng/dL — ABNORMAL LOW (ref 71–180)

## 2020-08-09 LAB — HEPARIN LEVEL (UNFRACTIONATED): Heparin Unfractionated: 0.57 IU/mL (ref 0.30–0.70)

## 2020-08-09 NOTE — Progress Notes (Addendum)
NAME:  Miranda Cohen, MRN:  [id number], DOB:  03/30/1946, LOS: 4 ADMISSION DATE:  08/05/2020, CONSULTATION DATE:  08/09/20 REFERRING MD:  Miranda Cohen, CHIEF COMPLAINT:  Hypercapnic respiratory failure   Brief History   74 y.o. F with PMH of tobacco use who presented with exertional shortness of breath, cough, diarrhea, fatigue since 8/1.  Work-up significant for small PE, hypercarbia, likely COPD exacerbation.  Bipap was initiated, however hypercarbia failed to significantly improve and pt became more lethargic so PCCM consulted.  History of present illness   Miranda Cohen is a 74 y.o. F with PMH of prolonged tobacco use, states has been smoking 1ppd for most of her life, who began feeling more dyspneic on exertion approximately one month ago. Over the last few days, she also developed diarrhea without nausea and vomiting  She presented to the ED several weeks ago, but left AMA.  She denied fevers, chills or significant leg edema  EMS reportedly found patient with oxygen sats in the 50%'s on RA, she was brought to the ED and placed on nasal cannula initially.  CXR with LLL infiltrate vs atelectasis, Covid-19 negative, BNP 900, Procal negative. CT chest with small RLL PE, cardiomegaly and moderate pericardial effusion, evidence of PAH and COPD.   ABG results pH 7.1, pCO2 115, pO2 87 bicarb 35.  She was admitted to the hospitalists and started on Bipap, heparin, and inhalers. Pt's repeat ABG showed no significant improvement and she remained lethargic, so PCCM consulted.  Past Medical History  Tobacco abuse  Significant Hospital Events   9/4 Admit to Hospitalists, ICU txfr  Consults:  PCCM  Procedures:  9/5 ETT 9/5 Right femoral artery cathter  Significant Diagnostic Tests:  9/4 CXR>>The lower portion of the left lung and left cardiac border are obscured by dense consolidation or pleural effusion. 9/4 CT chest>>small RLL PE, cardiomegaly and moderate pericardial effusion 9/5 ECHO >> LVEF  70-75%, large pericardial effusion 9/5 LE dopplers >> negative for DVT bilaterally 9/6 CT chest wo contrast >> moderate pericardial effusion, small pleural effusions b/l, stable diffuse enlargement of thyroid gland 9/7 ABI >> moderate RLE and mild LLE arterial disease  Micro Data:  9/4 Sars-CoV-2>>negative 9/4 BCx2>> 9/4 MRSA screen>>negative 9/4 repeat covid>>negative 9/5 BCx2>> 9/5 Respiratory culture >> moderate GPC in clusters, few GNR  Antimicrobials:  9/5 Cefepime >> 9/7 9/5 Vancomycin >> 9/5 9/8 Ceftriaxone >>  Interim history/subjective:  No acute events since transferring to the ICU. Remains sedated and on ventilator. Still requiring levophed support, but have been able to marginally wean down.   Started tube feeds.  Objective   Blood pressure (!) 117/58, pulse 71, temperature 97.9 F (36.6 C), resp. rate (!) 21, height 5\' 5"  (1.651 m), weight 105 kg, SpO2 99 %.    Vent Mode: PRVC FiO2 (%):  [40 %] 40 % Set Rate:  [14 bmp] 14 bmp Vt Set:  [450 mL] 450 mL PEEP:  [12 cmH20] 12 cmH20 Plateau Pressure:  [21 cmH20-24 cmH20] 24 cmH20   Intake/Output Summary (Last 24 hours) at 08/09/2020 1046 Last data filed at 08/09/2020 0800 Gross per 24 hour  Intake 1997.08 ml  Output 475 ml  Net 1522.08 ml   Filed Weights   08/06/20 0250 08/08/20 0700 08/09/20 0500  Weight: 100.4 kg 103.1 kg 105 kg    General: elderly, chronically ill-appearing female, on vent, NAD. HEENT: mucous membranes pink and moist, anicteric. Neuro: sedated CV: normal rate and regular rhythm, s1s2 heard, no m/r/g. Pulm: diminished, course breath  sounds bilaterally. Satting well on current vent settings (PRVC, FiO2 40%, PEEP 12). GI: soft, non-distended, non-tender abdomen. +BS. Extremities: feet cold to the touch bilaterally, diminished pedal pulses. Skin: no rashes or lesions, skin on toes is mottled bilaterally.   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 During ICU stay, patient has been bradycardic, hypothermic, hypotensive. Found to have large pericardial effusion on CT chest and ECHO. CT chest also showing small bilateral pleural effusions. -TSH 63 -Free T4 0.4 -Free T3 0.9 -TPO Ab >600 -AM cortisol 4.5 (patient was on day 3 of prednisone, not chronic steroid therapy, so would expect to see normal or falsely elevated cortisol levels) -Received IV levothyroxine once yesterday and IV liothyronine q8h yesterday -Transitioned to IV levo daily -Transitioned to IV liothyronine 2.54mcg q8h for next 4-6 days -On day 2 of IV hydrocortisone 100mg  q8h for adrenal insufficiency in the setting of severe hypothyroidism -Measure free T4 and free T3 every 1-2 days to monitor for improvement with therapy -Monitor for cardiac complications while on thyroid replacement therapy -Passive rewarming with blankets -Avoid active rewarming to prevent peripheral vasodilation and worsening hypotension  Acute Hypoxemic and Hypercapnic Respiratory Failure  In setting of likely undiagnosed COPD, L sided atelectesis (now resolved), myxedema coma 2/2 untreated chronic lymphocytic thyroiditis - Maintain full vent support with SAT/SBT as tolerated  - Follow CXR, ABG prn - ABG on 9/7 showing pH 7.41 / pO2 96 / PCO2 47.2 / HCO3 30.9 - Patient satting >95% on PRVC with FiO2 40% and PEEP 12 - Bronchial hygiene and RT/bronchodilator protocol. - On day 2 of IV hydrocortisone for treatment of likely adrenal insufficiency in the setting of severe hypothyroidism - continue scheduled duonebs - CT chest wo contrast on 9/6 did not show any findings concerning for lung malignancy -blood cultures - no growth in 3 days -sputum cultures - moderate GPC in clusters, few GNR, no staph aureus or pseudomonas seen -received 3  days of cefepime, will finish course with 2 days of ceftriaxone  Pericardial effusion, likely 2/2 severe hypothyroidism - Echo showing LVEF 70-75%, large posterior pericardial effusion, no evidence of tamponade - Consulted cardiology, greatly appreciate recs - will monitor for improvement with treatment of hypothyroidism - Limited ECHO on 9/7 showing stable pericardial effusion, mostly posterior - if effusion worsens or evidence of tamponade arises, consider consulting cardiac surgery for pericardial window - Would need to hold heparin ppx if pericardial window is pursued  Small, RLL PE -ECHO showing no right heart strain -continue heparin gtt  -LE dopplers negative  PAD -longtime cigarette smoker -ABI --> Right 0.78 (moderate), left 0.86 (mild)  Acute Kidney Injury Baseline creatinine is unknown. However, creatinine is trending up (Cr 1.47 >> 1.90 >> 1.92 >> 1.98)   -monitor BMP, UOP -ordered CK to assess for rhabdomyolysis in the setting of severe hypothyroidism  Polycythemia Likely secondary to chronic hypoxemia from underlying obstructive lung disease - Monitor CBC  Best practice:  Diet: NPO, TF Pain/Anxiety/Delirium protocol (if indicated): fentanyl gtt, propofol gtt VAP protocol (if indicated): HOB 30 degrees, suction prn DVT prophylaxis: heparin GI prophylaxis: protonix Glucose control: SII Mobility: bed rest Code Status: full code Family Communication: family updated, spoke with son on 9/6 Disposition: ICU  Labs   CBC: Recent Labs  Lab 08/05/20 1505 08/06/20 0354 08/06/20 0701 08/06/20  0701 08/06/20 1957 08/06/20 1957 08/07/20 0351 08/07/20 0351 08/07/20 0925 08/07/20 1752 08/08/20 0441 08/08/20 0853 08/09/20 0443  WBC 5.6  --  7.5  --  9.0  --  8.0  --   --   --  10.2  --  6.7  NEUTROABS 3.8  --   --   --   --   --   --   --   --   --   --   --   --   HGB 16.8*   < > 17.4*   < > 16.6*   < > 15.8*   < > 17.7* 17.7* 15.4* 17.0* 14.7  HCT 59.5*   < >  61.0*   < > 56.3*   < > 53.7*   < > 52.0* 52.0* 51.8* 50.0* 49.8*  MCV 98.5  --  97.8  --  95.1  --  94.4  --   --   --  92.5  --  92.2  PLT 186  --  135*  --  158  --  168  --   --   --  171  --  153   < > = values in this interval not displayed.    Basic Metabolic Panel: Recent Labs  Lab 08/05/20 1505 08/06/20 0354 08/06/20 0701 08/07/20 0925 08/07/20 0934 08/07/20 1752 08/08/20 0441 08/08/20 0853 08/09/20 0443  NA 136   < > 138   < > 137 138 138 136 136  K 5.0   < > 4.7   < > 4.9 5.0 5.0 4.5 4.4  CL 94*  --  95*  --  99  --  100  --  98  CO2 33*  --  30  --  28  --  27  --  26  GLUCOSE 95  --  158*  --  102*  --  108*  --  134*  BUN 30*  --  28*  --  37*  --  45*  --  54*  CREATININE 1.66*  --  1.47*  --  1.90*  --  1.92*  --  1.98*  CALCIUM 8.9  --  8.7*  --  8.2*  --  8.6*  --  8.8*   < > = values in this interval not displayed.   GFR: Estimated Creatinine Clearance: 29.1 mL/min (A) (by C-G formula based on SCr of 1.98 mg/dL (H)). Recent Labs  Lab 08/05/20 1505 08/05/20 1555 08/06/20 0701 08/06/20 1957 08/07/20 0351 08/08/20 0441 08/09/20 0443  PROCALCITON <0.10  --   --   --   --   --   --   WBC 5.6  --    < > 9.0 8.0 10.2 6.7  LATICACIDVEN  --  1.3  --   --   --   --   --    < > = values in this interval not displayed.    Liver Function Tests: Recent Labs  Lab 08/05/20 1505 08/07/20 0934  AST 14* 10*  ALT 12 12  ALKPHOS 66 51  BILITOT 1.3* 1.3*  PROT 6.7 5.7*  ALBUMIN 3.3* 2.6*   No results for input(s): LIPASE, AMYLASE in the last 168 hours. No results for input(s): AMMONIA in the last 168 hours.  ABG    Component Value Date/Time   PHART 7.419 08/08/2020 0853   PCO2ART 47.2 08/08/2020 0853   PO2ART 96 08/08/2020 0853   HCO3 30.9 (H) 08/08/2020 0853   TCO2 32  08/08/2020 0853   O2SAT 98.0 08/08/2020 0853     Coagulation Profile: Recent Labs  Lab 08/05/20 1700  INR 1.1    Cardiac Enzymes: No results for input(s): CKTOTAL, CKMB,  CKMBINDEX, TROPONINI in the last 168 hours.  HbA1C: Hgb A1c MFr Bld  Date/Time Value Ref Range Status  08/06/2020 07:00 AM 6.0 (H) 4.8 - 5.6 % Final    Comment:    (NOTE) Pre diabetes:          5.7%-6.4%  Diabetes:              >6.4%  Glycemic control for   <7.0% adults with diabetes     CBG: Recent Labs  Lab 08/08/20 1809 08/08/20 1946 08/08/20 2354 08/09/20 0408 08/09/20 0827  GLUCAP 136* 134* 126* 124* 151*     Merrilyn Puma, MD   PCCM attending:  This is a 74 year old female, past medical history of tobacco abuse presented for evaluation of shortness of breath cough diarrhea.  Patient found to have small pulmonary embolism, was intubated for hypercapnic failure.  Patient was found to have TSH of 63, low free T4 and greater than 600 TPO antibodies.  Patient was given IV levothyroxine plus hydrocortisone.  She was also hypothermic and bradycardic.  Today still remains on low-dose vasopressors.  Intubated sedated on mechanical life support.  BP (!) 117/58   Pulse 71   Temp 97.9 F (36.6 C)   Resp (!) 21   Ht 5\' 5"  (1.651 m)   Wt 105 kg   SpO2 99%   BMI 38.52 kg/m   General: Obese elderly female intubated on life support Heart: Bradycardic regular rhythm, S1-S2 Lungs: Bilateral mechanically ventilated breath sounds Extremities: Bilateral lower extremity edema Skin: Scattered bruising.  Assessment: Acute metabolic encephalopathy Multifactorial etiology related to hypercapnia, as well as hypothyroidism, possible myxedema coma, vs related to sepsis, present on admission  Acute hypoxemic hypercarbic respiratory failure requiring intubation mechanical ventilation. Echocardiogram with pericardial effusion, bilateral pleural effusions Appears to be fluid overloaded. Small right lower lobe pulmonary embolism Pneumonia, likely community-acquired, GPC's plus GNR in aspirate.  Plan: Remains on full mechanical life support. Continue to wean FiO2 as tolerated  maintain SPO2 greater than 90%. Complete course of antimicrobials, discussed with pharmacy. Echo with no evidence of tamponade. Appreciate cardiology input. Continue anticoagulation with heparin. Continue IV levothyroxine plus hydrocortisone. Wean vasopressors to maintain mean arterial pressure than 65 mm record.  This patient is critically ill with multiple organ system failure; which, requires frequent high complexity decision making, assessment, support, evaluation, and titration of therapies. This was completed through the application of advanced monitoring technologies and extensive interpretation of multiple databases. During this encounter critical care time was devoted to patient care services described in this note for 32 minutes.  , DO [facility] 08/09/2020 12:08 PM

## 2020-08-09 NOTE — Progress Notes (Signed)
ANTICOAGULATION CONSULT NOTE - Follow Up Consult  Pharmacy Consult for Heparin Indication: pulmonary embolus  No Known Allergies  Patient Measurements: Height: 5\' 5"  (165.1 cm) Weight: 105 kg (231 lb 7.7 oz) IBW/kg (Calculated) : 57 Heparin Dosing Weight: 103kg  Vital Signs: Temp: 97.7 F (36.5 C) (09/08 0600) BP: 101/57 (09/08 0600) Pulse Rate: 56 (09/08 0600)  Labs: Recent Labs    08/07/20 0351 08/07/20 0925 08/07/20 0934 08/07/20 1752 08/08/20 0441 08/08/20 0441 08/08/20 0853 08/08/20 1354 08/08/20 2234 08/09/20 0443  HGB 15.8*   < >  --    < > 15.4*   < > 17.0*  --   --  14.7  HCT 53.7*   < >  --    < > 51.8*  --  50.0*  --   --  49.8*  PLT 168  --   --   --  171  --   --   --   --  153  HEPARINUNFRC  --   --   --   --  0.79*   < >  --  0.62 0.54 0.57  CREATININE  --   --  1.90*  --  1.92*  --   --   --   --  1.98*   < > = values in this interval not displayed.    Estimated Creatinine Clearance: 29.1 mL/min (A) (by C-G formula based on SCr of 1.98 mg/dL (H)).   Assessment: 73 yof who presented to the ED with shortness of breath and found to have small pulmonary embolism.   Heparin level therapeutic (0.57) on gtt at 1000 units/hr. Patient has been therapeutic x3 at this time. No bleeding noted. Patient's CBC is stable at this time. Monitor platelets due to slow downtrend.   Goal of Therapy:  Heparin level 0.3-0.7 units/ml Monitor platelets by anticoagulation protocol: Yes   Plan:  Continue heparin at 1000 units/hr Daily CBC and HL  Monitor for s/sx of bleeding   71, PharmD, [facility] Resident [phone] 08/09/2020 7:12 AM

## 2020-08-09 NOTE — Progress Notes (Signed)
Progress Note  Patient Name: Miranda Cohen Date of Encounter: 08/09/2020  [facility] Cardiologist: Fransico Him, MD   Subjective   Intubated and sedated.  Opens eye, not following commands  Inpatient Medications    Scheduled Meds:  chlorhexidine gluconate (MEDLINE KIT)  15 mL Mouth Rinse BID   Chlorhexidine Gluconate Cloth  6 each Topical Q0600   docusate  100 mg Per Tube BID   feeding supplement (PROSource TF)  45 mL Per Tube TID   feeding supplement (VITAL HIGH PROTEIN)  1,000 mL Per Tube Q24H   hydrocortisone sod succinate (SOLU-CORTEF) inj  100 mg Intravenous Q8H   insulin aspart  0-9 Units Subcutaneous Q4H   ipratropium-albuterol  3 mL Nebulization BID   levothyroxine  50 mcg Intravenous Daily   liothyronine  2.5 mcg Per Tube Q8H   mouth rinse  15 mL Mouth Rinse 10 times per day   nicotine  21 mg Transdermal Daily   pantoprazole (PROTONIX) IV  40 mg Intravenous Daily   polyethylene glycol  17 g Per Tube Daily   Continuous Infusions:  sodium chloride 10 mL/hr at 08/09/20 0800   cefTRIAXone (ROCEPHIN)  IV     fentaNYL infusion INTRAVENOUS 75 mcg/hr (08/09/20 0825)   heparin 1,000 Units/hr (08/09/20 0800)   norepinephrine (LEVOPHED) Adult infusion 4.5 mcg/min (08/09/20 0800)   propofol (DIPRIVAN) infusion 15 mcg/kg/min (08/09/20 0826)   PRN Meds: albuterol, fentaNYL   Vital Signs    Vitals:   08/09/20 0730 08/09/20 0745 08/09/20 0800 08/09/20 0815  BP:   (!) 101/53   Pulse: (!) 55 (!) 55 (!) 54 (!) 55  Resp: 14 14 14 14   Temp: 97.9 F (36.6 C) 97.9 F (36.6 C) 97.9 F (36.6 C) 97.9 F (36.6 C)  TempSrc:   Bladder   SpO2: 97% 97% 97% 98%  Weight:      Height:        Intake/Output Summary (Last 24 hours) at 08/09/2020 0859 Last data filed at 08/09/2020 0800 Gross per 24 hour  Intake 2133.61 ml  Output 530 ml  Net 1603.61 ml   Last 3 Weights 08/09/2020 08/08/2020 08/06/2020  Weight (lbs) 231 lb 7.7 oz 227 lb 4.7 oz 221 lb 7.2 oz    Weight (kg) 105 kg 103.1 kg 100.45 kg      Telemetry    NSR 50-60s - Personally Reviewed  ECG    No new ECG - Personally Reviewed  Physical Exam   GEN: Intubated and sedated Neck: No JVD appreciated, but difficult to assess given habitus Cardiac: RRR, no murmurs Respiratory: Clear to auscultation in anterior fields GI: Soft, nontender MS: No edema Neuro:  Opens eyes, not following commands Psych: Unable to assess  Labs    High Sensitivity Troponin:   Recent Labs  Lab 08/05/20 1505  TROPONINIHS 19*      Chemistry Recent Labs  Lab 08/05/20 1505 08/06/20 0354 08/07/20 0934 08/07/20 1752 08/08/20 0441 08/08/20 0853 08/09/20 0443  NA 136   < > 137   < > 138 136 136  K 5.0   < > 4.9   < > 5.0 4.5 4.4  CL 94*   < > 99  --  100  --  98  CO2 33*   < > 28  --  27  --  26  GLUCOSE 95   < > 102*  --  108*  --  134*  BUN 30*   < > 37*  --  45*  --  54*  CREATININE 1.66*   < > 1.90*  --  1.92*  --  1.98*  CALCIUM 8.9   < > 8.2*  --  8.6*  --  8.8*  PROT 6.7  --  5.7*  --   --   --   --   ALBUMIN 3.3*  --  2.6*  --   --   --   --   AST 14*  --  10*  --   --   --   --   ALT 12  --  12  --   --   --   --   ALKPHOS 66  --  51  --   --   --   --   BILITOT 1.3*  --  1.3*  --   --   --   --   GFRNONAA 30*   < > 25*  --  25*  --  24*  GFRAA 34*   < > 29*  --  29*  --  28*  ANIONGAP 9   < > 10  --  11  --  12   < > = values in this interval not displayed.     Hematology Recent Labs  Lab 08/07/20 0351 08/07/20 0925 08/08/20 0441 08/08/20 0853 08/09/20 0443  WBC 8.0  --  10.2  --  6.7  RBC 5.69*  --  5.60*  --  5.40*  HGB 15.8*   < > 15.4* 17.0* 14.7  HCT 53.7*   < > 51.8* 50.0* 49.8*  MCV 94.4  --  92.5  --  92.2  MCH 27.8  --  27.5  --  27.2  MCHC 29.4*  --  29.7*  --  29.5*  RDW 16.0*  --  16.0*  --  15.8*  PLT 168  --  171  --  153   < > = values in this interval not displayed.    BNP Recent Labs  Lab 08/05/20 1505  BNP 908.3*     DDimer  Recent  Labs  Lab 08/05/20 1700  DDIMER >20.00*     Radiology    CT CHEST WO CONTRAST  Result Date: 08/07/2020 CLINICAL DATA:  Acute respiratory failure. EXAM: CT CHEST WITHOUT CONTRAST TECHNIQUE: Multidetector CT imaging of the chest was performed following the standard protocol without IV contrast. COMPARISON:  August 05, 2020. FINDINGS: Cardiovascular: Atherosclerosis of thoracic aorta is noted without aneurysm formation. Moderate size pericardial effusion is noted. Mild coronary artery calcifications are noted. Mediastinum/Nodes: Endotracheal tube is in good position. Nasogastric tube is seen passing through esophagus and into stomach. Stable diffuse enlargement of thyroid gland is noted. No adenopathy is noted. Lungs/Pleura: No pneumothorax is noted. Mild emphysematous disease is noted in the upper lobes bilaterally. Small pleural effusions are noted with adjacent atelectasis or infiltrates of both lower lobes. Upper Abdomen: Cholelithiasis is noted. Musculoskeletal: No chest wall mass or suspicious bone lesions identified. IMPRESSION: 1. Moderate size pericardial effusion is noted. 2. Mild coronary artery calcifications are noted suggesting coronary artery disease. 3. Small pleural effusions are noted with adjacent atelectasis or infiltrates of both lower lobes. 4. Endotracheal and nasogastric tubes are in good position. 5. Cholelithiasis. 6. Stable diffuse enlargement of thyroid gland is noted. 7. Emphysema and aortic atherosclerosis. Aortic Atherosclerosis (ICD10-I70.0) and Emphysema (ICD10-J43.9). Electronically Signed   By: Marijo Conception M.D.   On: 08/07/2020 11:01   VAS [place] ABI WITH/WO TBI  Result Date: 08/08/2020 LOWER EXTREMITY DOPPLER  STUDY Indications: Decreased pulses and ecchymosis bilateral LE.  Comparison Study: no prior Performing Technologist: Abram Sander RVS  Examination Guidelines: A complete evaluation includes at minimum, Doppler waveform signals and systolic blood pressure reading  at the level of bilateral brachial, anterior tibial, and posterior tibial arteries, when vessel segments are accessible. Bilateral testing is considered an integral part of a complete examination. Photoelectric Plethysmograph (PPG) waveforms and toe systolic pressure readings are included as required and additional duplex testing as needed. Limited examinations for reoccurring indications may be performed as noted.  ABI Findings: +---------+------------------+-----+---------+--------------------------+  Right     Rt Pressure (mmHg) Index Waveform  Comment                     +---------+------------------+-----+---------+--------------------------+  Brachial                           triphasic unable to obtain due to iv  +---------+------------------+-----+---------+--------------------------+  PTA       104                0.78  triphasic                             +---------+------------------+-----+---------+--------------------------+  DP        94                 0.71  biphasic                              +---------+------------------+-----+---------+--------------------------+  Great Toe 67                 0.50                                        +---------+------------------+-----+---------+--------------------------+ +--------+------------------+-----+---------+-------+  Left     Lt Pressure (mmHg) Index Waveform  Comment  +--------+------------------+-----+---------+-------+  Brachial 133                      triphasic          +--------+------------------+-----+---------+-------+  PTA      113                0.85  triphasic          +--------+------------------+-----+---------+-------+  DP       115                0.86  triphasic          +--------+------------------+-----+---------+-------+ +-------+-----------+-----------+------------+------------+  ABI/TBI Today's ABI Today's TBI Previous ABI Previous TBI  +-------+-----------+-----------+------------+------------+  Right   0.78        0.50                                    +-------+-----------+-----------+------------+------------+  Left    0.86                                               +-------+-----------+-----------+------------+------------+  Summary: Right: Resting right ankle-brachial index indicates moderate right lower extremity arterial disease. The right toe-brachial  index is abnormal. Left: Resting left ankle-brachial index indicates mild left lower extremity arterial disease.  *See table(s) above for measurements and observations.  Electronically signed by Deitra Mayo MD on 08/08/2020 at 4:05:44 PM.   Final    ECHOCARDIOGRAM LIMITED  Result Date: 08/08/2020    ECHOCARDIOGRAM LIMITED REPORT   Patient Name:   JENISE IANNELLI Reichelt Date of Exam: 08/08/2020 Medical Rec #:  [id number]       Height:       65.0 in Accession #:    [id number]      Weight:       227.3 lb Date of Birth:  06/13/1946        BSA:          2.089 m Patient Age:    37 years        BP:           113/60 mmHg Patient Gender: F               HR:           51 bpm. Exam Location:  Inpatient Procedure: Limited Echo, Cardiac Doppler and Color Doppler Indications:    Pericardial effusion 423.9 / I31.3  History:        Patient has prior history of Echocardiogram examinations, most                 recent 08/06/2020. Risk Factors:Current Smoker. Acute respiratory                 failure.  Sonographer:    Darlina Sicilian RDCS Referring Phys: [id number] [place]  Sonographer Comments: Echo performed with patient supine and on artificial respirator. IMPRESSIONS  1. Left ventricular ejection fraction, by estimation, is 60 to 65%. The left ventricle has normal function. The left ventricle has no regional wall motion abnormalities. The average left ventricular global longitudinal strain is -19.3 %. The global longitudinal strain is normal.  2. Right ventricular systolic function is normal. The right ventricular size is mildly enlarged. Tricuspid regurgitation signal is inadequate for  assessing PA pressure.  3. Moderate pericardial effusion. The pericardial effusion is circumferential. The majority of the effusion is posterior measuring 2.26cm in greatest diameter posterior to the LV.     Anterior to the RV there is a thickened appearing mass within the pericardium. This could be a fat pad but it is thicker than would usually be seen. Recommend cardiac MRI.  4. The mitral valve is normal in structure. No evidence of mitral valve regurgitation. No evidence of mitral stenosis.  5. The aortic valve is normal in structure. Aortic valve regurgitation is not visualized. No aortic stenosis is present.  6. The inferior vena cava is dilated in size with <50% respiratory variability, suggesting right atrial pressure of 15 mmHg. FINDINGS  Left Ventricle: Left ventricular ejection fraction, by estimation, is 60 to 65%. The left ventricle has normal function. The left ventricle has no regional wall motion abnormalities. The average left ventricular global longitudinal strain is -19.3 %. The global longitudinal strain is normal. The left ventricular internal cavity size was normal in size. There is no left ventricular hypertrophy. Right Ventricle: The right ventricular size is mildly enlarged. No increase in right ventricular wall thickness. Right ventricular systolic function is normal. Tricuspid regurgitation signal is inadequate for assessing PA pressure. Left Atrium: Left atrial size was normal in size. Right Atrium: Right atrial size was normal in size. Pericardium: The majority of the effusion is posterior  measuring 2.26cm in greatest diameter posterior to the LV. Anterior to the RV there is a thickened appearing mass within the pericardium. This could be a fat pad but it is thicker than would usually be seen. Recommend cardiac MRI. A moderately sized pericardial effusion is present. The pericardial effusion is circumferential. Mitral Valve: The mitral valve is normal in structure. Normal mobility of the  mitral valve leaflets. Mild to moderate mitral annular calcification. No evidence of mitral valve stenosis. Tricuspid Valve: The tricuspid valve is normal in structure. Tricuspid valve regurgitation is not demonstrated. No evidence of tricuspid stenosis. Aortic Valve: The aortic valve is normal in structure. Aortic valve regurgitation is not visualized. No aortic stenosis is present. Pulmonic Valve: The pulmonic valve was normal in structure. Pulmonic valve regurgitation is not visualized. No evidence of pulmonic stenosis. Aorta: The aortic root is normal in size and structure. Venous: The inferior vena cava is dilated in size with less than 50% respiratory variability, suggesting right atrial pressure of 15 mmHg. IAS/Shunts: No atrial level shunt detected by color flow Doppler. LEFT VENTRICLE PLAX 2D LVOT diam:     1.80 cm LV SV:         63       2D Longitudinal Strain LV SV Index:   30       2D Strain GLS (A2C):   -20.2 % LVOT Area:     2.54 cm 2D Strain GLS (A3C):   -20.3 %                         2D Strain GLS (A4C):   -17.5 %                         2D Strain GLS Avg:     -19.3 % RIGHT VENTRICLE TAPSE (M-mode): 1.0 cm RIGHT ATRIUM           Index RA Area:     17.10 cm RA Volume:   46.80 ml  22.41 ml/m  AORTIC VALVE LVOT Vmax:   94.20 cm/s LVOT Vmean:  61.700 cm/s LVOT VTI:    0.247 m  SHUNTS Systemic VTI:  0.25 m Systemic Diam: 1.80 cm Fransico Him MD Electronically signed by Fransico Him MD Signature Date/Time: 08/08/2020/11:32:37 AM    Final     Cardiac Studies   Echo 08/06/20: 1. Left ventricular ejection fraction, by estimation, is 70 to 75%. The  left ventricle has hyperdynamic function. The left ventricle has no  regional wall motion abnormalities. There is severe concentric left  ventricular hypertrophy. Left ventricular  diastolic parameters are indeterminate.  2. Right ventricular systolic function was not well visualized. The right  ventricular size is not well visualized.  3. Large  pericardial effusion. The pericardial effusion is posterior to  the left ventricle.  4. The mitral valve is grossly normal. Trivial mitral valve  regurgitation.  5. The aortic valve is tricuspid. Aortic valve regurgitation is not  visualized. No aortic stenosis is present.   Conclusion(s)/Recommendation(s): Difficult images, RV not well visualized  but appears grossly normal on subcostal images. There is a large  pericardial effusion, with largest dimension posterior to LV measured as  2.55 cm. No clear respiratory variation,  but patient is on ventilator. No clear RA or RV diastolic collapse seen.  No apparent tamponade by echo parameters, but recommend clinical  correlation.   Patient Profile     74 y.o. female with PMHx  tobacco abuse presents with acute hypoxic respiratory failure  Assessment & Plan    Pericardial effusion: Large posterior effusion on echo, no evidence of tamponade.  Suspect secondary to hypothyroidism, TSH 63.  Repeat Echo 9/7 showed stable effusion. -Would expect effusion to improve with treatment of hypothyroidism.  Effusion appears stable on repeat echo, no evidence of tamponade.  Would plan repeat limited echo in 2 weeks to monitor. Would repeat sooner if patient develops hypotension or any clinical concern for tamponade.  ?Cardiac mass: echo 9/7 read as concerning for mass anterior to RV.  I think this is a prominent fat pad; reviewed on recent CT chest and Hounsfield units consistent with fat.  No further workup recommended.  PE: small RLL PE, on heparin gtt   For questions or updates, please contact [place] Please consult [email or web address] for contact info under        Signed, Donato Heinz, MD  08/09/2020, 8:59 AM

## 2020-08-10 ENCOUNTER — Other Ambulatory Visit: Payer: Self-pay | Admitting: Medical

## 2020-08-10 ENCOUNTER — Inpatient Hospital Stay (HOSPITAL_COMMUNITY): Payer: Medicare Other

## 2020-08-10 DIAGNOSIS — I2693 Single subsegmental pulmonary embolism without acute cor pulmonale: Secondary | ICD-10-CM

## 2020-08-10 DIAGNOSIS — I3139 Other pericardial effusion (noninflammatory): Secondary | ICD-10-CM

## 2020-08-10 LAB — BASIC METABOLIC PANEL
Anion gap: 10 (ref 5–15)
BUN: 67 mg/dL — ABNORMAL HIGH (ref 8–23)
CO2: 27 mmol/L (ref 22–32)
Calcium: 8.5 mg/dL — ABNORMAL LOW (ref 8.9–10.3)
Chloride: 101 mmol/L (ref 98–111)
Creatinine, Ser: 1.6 mg/dL — ABNORMAL HIGH (ref 0.44–1.00)
GFR calc Af Amer: 36 mL/min — ABNORMAL LOW (ref >60)
GFR calc non Af Amer: 31 mL/min — ABNORMAL LOW (ref >60)
Glucose, Bld: 102 mg/dL — ABNORMAL HIGH (ref 70–99)
Potassium: 4.3 mmol/L (ref 3.5–5.1)
Sodium: 138 mmol/L (ref 135–145)

## 2020-08-10 LAB — CBC
HCT: 46.9 % — ABNORMAL HIGH (ref 36.0–46.0)
Hemoglobin: 13.9 g/dL (ref 12.0–15.0)
MCH: 27.4 pg (ref 26.0–34.0)
MCHC: 29.6 g/dL — ABNORMAL LOW (ref 30.0–36.0)
MCV: 92.5 fL (ref 80.0–100.0)
Platelets: 122 10*3/uL — ABNORMAL LOW (ref 150–400)
RBC: 5.07 MIL/uL (ref 3.87–5.11)
RDW: 15.7 % — ABNORMAL HIGH (ref 11.5–15.5)
WBC: 5.1 10*3/uL (ref 4.0–10.5)
nRBC: 0 % (ref 0.0–0.2)

## 2020-08-10 LAB — GLUCOSE, CAPILLARY
Glucose-Capillary: 102 mg/dL — ABNORMAL HIGH (ref 70–99)
Glucose-Capillary: 103 mg/dL — ABNORMAL HIGH (ref 70–99)
Glucose-Capillary: 108 mg/dL — ABNORMAL HIGH (ref 70–99)
Glucose-Capillary: 111 mg/dL — ABNORMAL HIGH (ref 70–99)
Glucose-Capillary: 115 mg/dL — ABNORMAL HIGH (ref 70–99)
Glucose-Capillary: 133 mg/dL — ABNORMAL HIGH (ref 70–99)

## 2020-08-10 LAB — CULTURE, BLOOD (ROUTINE X 2)
Culture: NO GROWTH
Culture: NO GROWTH

## 2020-08-10 LAB — HEPARIN LEVEL (UNFRACTIONATED): Heparin Unfractionated: 0.53 IU/mL (ref 0.30–0.70)

## 2020-08-10 LAB — TRIGLYCERIDES: Triglycerides: 95 mg/dL (ref <150)

## 2020-08-10 LAB — T4, FREE: Free T4: 0.57 ng/dL — ABNORMAL LOW (ref 0.61–1.12)

## 2020-08-10 MED ORDER — POLYETHYLENE GLYCOL 3350 17 G PO PACK
17.0000 g | PACK | Freq: Every day | ORAL | Status: DC
  Filled 2020-08-10: qty 1

## 2020-08-10 MED ORDER — ORAL CARE MOUTH RINSE: 15.0000 mL | Freq: Two times a day (BID) | OROMUCOSAL | Status: DC

## 2020-08-10 MED ORDER — CHLORHEXIDINE GLUCONATE 0.12 % MT SOLN
15.0000 mL | Freq: Two times a day (BID) | OROMUCOSAL | Status: DC
  Filled 2020-08-10: qty 15

## 2020-08-10 MED ORDER — LIOTHYRONINE SODIUM 5 MCG PO TABS
2.5000 ug | ORAL_TABLET | Freq: Three times a day (TID) | ORAL | Status: DC
  Administered 2020-08-10: 2.5 ug via ORAL
  Filled 2020-08-10 (×3): qty 1

## 2020-08-10 MED ORDER — DOCUSATE SODIUM 50 MG/5ML PO LIQD
100.0000 mg | Freq: Two times a day (BID) | ORAL | Status: DC
  Administered 2020-08-10: 100 mg via ORAL
  Filled 2020-08-10: qty 10

## 2020-08-10 MED ORDER — ONDANSETRON HCL 4 MG/2ML IJ SOLN
4.0000 mg | Freq: Four times a day (QID) | INTRAMUSCULAR | Status: DC | PRN
  Administered 2020-08-10: 4 mg via INTRAVENOUS
  Filled 2020-08-10: qty 2

## 2020-08-10 MED ORDER — SENNA 8.6 MG PO TABS
2.0000 | ORAL_TABLET | Freq: Two times a day (BID) | ORAL | Status: DC
  Administered 2020-08-10 – 2020-08-17 (×9): 17.2 mg via ORAL
  Filled 2020-08-10 (×11): qty 2

## 2020-08-10 NOTE — Progress Notes (Addendum)
NAME:  Miranda Cohen, MRN:  [id number], DOB:  Jun 20, 1946, LOS: 5 ADMISSION DATE:  08/05/2020, CONSULTATION DATE:  08/10/20 REFERRING MD:  Julian Reil, CHIEF COMPLAINT:  Hypercapnic respiratory failure   Brief History   74 y.o. F with PMH of tobacco use who presented with exertional shortness of breath, cough, diarrhea, fatigue since 8/1.  Work-up significant for small PE, hypercarbia, likely COPD exacerbation.  Bipap was initiated, however hypercarbia failed to significantly improve and pt became more lethargic so PCCM consulted.  History of present illness   Miranda Cohen is a 74 y.o. F with PMH of prolonged tobacco use, states has been smoking 1ppd for most of her life, who began feeling more dyspneic on exertion approximately one month ago. Over the last few days, she also developed diarrhea without nausea and vomiting  She presented to the ED several weeks ago, but left AMA.  She denied fevers, chills or significant leg edema  EMS reportedly found patient with oxygen sats in the 50%'s on RA, she was brought to the ED and placed on nasal cannula initially.  CXR with LLL infiltrate vs atelectasis, Covid-19 negative, BNP 900, Procal negative. CT chest with small RLL PE, cardiomegaly and moderate pericardial effusion, evidence of PAH and COPD.   ABG results pH 7.1, pCO2 115, pO2 87 bicarb 35.  She was admitted to the hospitalists and started on Bipap, heparin, and inhalers. Pt's repeat ABG showed no significant improvement and she remained lethargic, so PCCM consulted.  Past Medical History  Tobacco abuse  Significant Hospital Events   9/4 Admit to Hospitalists, ICU txfr  Consults:  PCCM  Procedures:  9/5 ETT 9/5 Right femoral artery cathter  Significant Diagnostic Tests:  9/4 CXR>>The lower portion of the left lung and left cardiac border are obscured by dense consolidation or pleural effusion. 9/4 CT chest>>small RLL PE, cardiomegaly and moderate pericardial effusion 9/5 ECHO >> LVEF  70-75%, large pericardial effusion 9/5 LE dopplers >> negative for DVT bilaterally 9/6 CT chest wo contrast >> moderate pericardial effusion, small pleural effusions b/l, stable diffuse enlargement of thyroid gland 9/7 ABI >> moderate RLE and mild LLE arterial disease  Micro Data:  9/4 Sars-CoV-2>>negative 9/4 BCx2>> 9/4 MRSA screen>>negative 9/4 repeat covid>>negative 9/5 BCx2>> 9/5 Respiratory culture >> moderate GPC in clusters, few GNR  Antimicrobials:  9/5 Cefepime >> 9/7 9/5 Vancomycin >> 9/5 9/8 Ceftriaxone >> 9/9  Interim history/subjective:  Off pressors, maintaining good blood pressures. Taken off sedation, patient able to open eyes and follows easy commands, but falls asleep when not stimulated (likely from lingering sedative effects). Will try to perform SBT, with possibility of extubation today.   Objective   Blood pressure (!) 111/55, pulse 75, temperature 98.4 F (36.9 C), resp. rate 20, height 5\' 5"  (1.651 m), weight 105 kg, SpO2 98 %.    Vent Mode: PSV;CPAP FiO2 (%):  [40 %] 40 % Set Rate:  [14 bmp] 14 bmp Vt Set:  [450 mL] 450 mL PEEP:  [5 cmH20-12 cmH20] 5 cmH20 Pressure Support:  [5 cmH20] 5 cmH20 Plateau Pressure:  [17 cmH20-21 cmH20] 19 cmH20   Intake/Output Summary (Last 24 hours) at 08/10/2020 1023 Last data filed at 08/10/2020 0800 Gross per 24 hour  Intake 1447.23 ml  Output 650 ml  Net 797.23 ml   Filed Weights   08/06/20 0250 08/08/20 0700 08/09/20 0500  Weight: 100.4 kg 103.1 kg 105 kg    General: elderly, chronically ill-appearing female, on vent, NAD. HEENT: mucus membranes pink and  moist, anicteric. Neuro: slightly sedated, opens eyes to voice, able to follow simple commands when stimulated. CV: normal rate and regular rhythm, no m/r/g. Pulm: bilateral mechanically ventilated breath sounds. GI: soft, non-distended abdomen. +BS. Extremities: feet cold to the touch, decreased pedal pulses bilaterally. Skin: skin on toes is mottled  bilaterally.   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 During ICU stay, patient has been bradycardic, hypothermic, hypotensive. Found to have large pericardial effusion on CT chest and ECHO. CT chest also showing small bilateral pleural effusions. -TSH 63 -Free T4 0.4 -Free T3 0.9 -TPO Ab >600 -AM cortisol 4.5  -continue IV levo daily -continue IV liothyronine 2.43mcg q8h for next 3-5 days -On day 3 of IV hydrocortisone 100mg  q8h for adrenal insufficiency in the setting of severe hypothyroidism -Measure free T4 and free T3 every 1-2 days -Ordered free T4 and T3 today, will f/u with results -Passive rewarming with blankets -Patient's vitals appear to be improving with thyroid replacement therapy, no longer bradycardic and hypothermic -MAP >65 while off pressors  Acute Hypoxemic and Hypercapnic Respiratory Failure  In setting of likely undiagnosed COPD, L sided atelectesis (now resolved), possible myxedema coma 2/2 untreated chronic lymphocytic thyroiditis - Maintain full vent support with SAT/SBT as tolerated  - Follow CXR, ABG prn - Patient satting >95% on PRVC with FiO2 40% and PEEP 10 - continue scheduled duonebs -blood cultures - no growth in 4 days -sputum cultures - moderate GPC in clusters, few GNR, no staph aureus or pseudomonas seen -on last day of abx (3 days cefepime, 2 days ceftriaxone) -weaning off sedation -will attempt SBT this AM, with possibility of extubation today  Pericardial effusion, likely 2/2 severe hypothyroidism - Echo showing LVEF 70-75%, large posterior pericardial effusion, no evidence of tamponade - Consulted cardiology, greatly appreciate recs - will monitor for improvement with treatment of hypothyroidism - Limited ECHO on 9/7 showing stable  pericardial effusion, mostly posterior - if effusion worsens or evidence of tamponade arises, consider consulting cardiac surgery for pericardial window - Would need to hold heparin ppx if pericardial window is pursued  Small, RLL PE -ECHO showing no right heart strain -continue heparin gtt  -LE dopplers negative  PAD -longtime cigarette smoker -ABI --> Right 0.78 (moderate), left 0.86 (mild)  Acute Kidney Injury Baseline creatinine is unknown. -Cr 1.98 --> 1.6  -still not making good urine (UOP 0.25L overnight) -monitor BMP, UOP -CK 61, unlikely to be hypothyroid myopathy/rhabdomyolysis  Polycythemia, resolved Likely secondary to chronic hypoxemia from underlying obstructive lung disease -Hgb 13.9 today  Best practice:  Diet: NPO, TF Pain/Anxiety/Delirium protocol (if indicated): fentanyl gtt, propofol gtt VAP protocol (if indicated): HOB 30 degrees, suction prn DVT prophylaxis: heparin GI prophylaxis: protonix Glucose control: SII Mobility: bed rest Code Status: full code Family Communication: family updated, spoke with daughter on 9/8 Disposition: ICU  Labs   CBC: Recent Labs  Lab 08/05/20 1505 08/06/20 0354 08/06/20 1957 08/06/20 1957 08/07/20 0351 08/07/20 0925 08/07/20 1752 08/08/20 0441 08/08/20 0853 08/09/20 0443 08/10/20 0644  WBC 5.6   < > 9.0  --  8.0  --   --  10.2  --  6.7 5.1  NEUTROABS 3.8  --   --   --   --   --   --   --   --   --   --  HGB 16.8*   < > 16.6*   < > 15.8*   < > 17.7* 15.4* 17.0* 14.7 13.9  HCT 59.5*   < > 56.3*   < > 53.7*   < > 52.0* 51.8* 50.0* 49.8* 46.9*  MCV 98.5   < > 95.1  --  94.4  --   --  92.5  --  92.2 92.5  PLT 186   < > 158  --  168  --   --  171  --  153 122*   < > = values in this interval not displayed.    Basic Metabolic Panel: Recent Labs  Lab 08/06/20 0701 08/07/20 0925 08/07/20 0934 08/07/20 0934 08/07/20 1752 08/08/20 0441 08/08/20 0853 08/09/20 0443 08/10/20 0644  NA 138   < > 137   < > 138  138 136 136 138  K 4.7   < > 4.9   < > 5.0 5.0 4.5 4.4 4.3  CL 95*  --  99  --   --  100  --  98 101  CO2 30  --  28  --   --  27  --  26 27  GLUCOSE 158*  --  102*  --   --  108*  --  134* 102*  BUN 28*  --  37*  --   --  45*  --  54* 67*  CREATININE 1.47*  --  1.90*  --   --  1.92*  --  1.98* 1.60*  CALCIUM 8.7*  --  8.2*  --   --  8.6*  --  8.8* 8.5*   < > = values in this interval not displayed.   GFR: Estimated Creatinine Clearance: 36 mL/min (A) (by C-G formula based on SCr of 1.6 mg/dL (H)). Recent Labs  Lab 08/05/20 1505 08/05/20 1555 08/06/20 0701 08/07/20 0351 08/08/20 0441 08/09/20 0443 08/10/20 0644  PROCALCITON <0.10  --   --   --   --   --   --   WBC 5.6  --    < > 8.0 10.2 6.7 5.1  LATICACIDVEN  --  1.3  --   --   --   --   --    < > = values in this interval not displayed.    Liver Function Tests: Recent Labs  Lab 08/05/20 1505 08/07/20 0934  AST 14* 10*  ALT 12 12  ALKPHOS 66 51  BILITOT 1.3* 1.3*  PROT 6.7 5.7*  ALBUMIN 3.3* 2.6*   No results for input(s): LIPASE, AMYLASE in the last 168 hours. No results for input(s): AMMONIA in the last 168 hours.  ABG    Component Value Date/Time   PHART 7.419 08/08/2020 0853   PCO2ART 47.2 08/08/2020 0853   PO2ART 96 08/08/2020 0853   HCO3 30.9 (H) 08/08/2020 0853   TCO2 32 08/08/2020 0853   O2SAT 98.0 08/08/2020 0853     Coagulation Profile: Recent Labs  Lab 08/05/20 1700  INR 1.1    Cardiac Enzymes: Recent Labs  Lab 08/09/20 1152  CKTOTAL 61    HbA1C: Hgb A1c MFr Bld  Date/Time Value Ref Range Status  08/06/2020 07:00 AM 6.0 (H) 4.8 - 5.6 % Final    Comment:    (NOTE) Pre diabetes:          5.7%-6.4%  Diabetes:              >6.4%  Glycemic control for   <7.0% adults with diabetes  CBG: Recent Labs  Lab 08/09/20 1605 08/09/20 1939 08/09/20 2325 08/10/20 0323 08/10/20 0830  GLUCAP 118* 160* 131* 133* 108*     Merrilyn Puma, MD 08/10/2020 10:23 AM    Pulmonary  critical care attending:  This 74 year old female past medical history of tobacco abuse seen for evaluation of shortness of breath cough and diarrhea.  Patient was found to be in hypercapnic respiratory failure intubated on mechanical life support.  CTA with small pulmonary embolism.  Additionally found to have a TSH of 63, low T4 and greater than 600 TPO antibodies.  Patient was treated for hypothyroidism to include IV levothyroxine plus hydrocortisone.  Initially was also hypothermic and bradycardic which has now resolved.  Patient is now no longer on vasopressors.  Able to wean down to low-dose propofol.  She is awake alert following commands this morning.  On pressure support ventilation and tolerating SBT/SAT well.  BP 133/67   Pulse (!) 115   Temp 99 F (37.2 C)   Resp (!) 24   Ht 5\' 5"  (1.651 m)   Wt 105 kg   SpO2 93%   BMI 38.52 kg/m   General: Obese female intubated mechanical life support Heart: Bradycardic regular S1-S2 lungs: Bilateral mechanically vented breath sounds Extremities: Trace bilateral lower extremity edema Skin: Scattered bruising and thinning  Labs: Reviewed  Assessment: Acute metabolic encephalopathy, multifactorial etiology related to hypercapnia, hypothyroidism, questionable myxedema/related to sepsis present on admission, pneumonia likely community-acquired, respiratory cultures with no staph or Pseudomonas present, grew out normal flora, blood cultures no growth to date Echocardiogram with pericardial effusion, bilateral pleural effusions, fluid overload Small right lower lobe pulmonary embolism AKI, likely ATN related to hypotension on admission  Plan: Continue full mechanical support, tolerating SBT SAT If she does well for the next 30 minutes plan to extubate. Complete course of antimicrobials discussed with pharmacy Continue to follow urine output I suspect this will continue to improve on its own.  Continue to follow BMP Continue IV levothyroxine  plus hydrocortisone  This patient is critically ill with multiple organ system failure; which, requires frequent high complexity decision making, assessment, support, evaluation, and titration of therapies. This was completed through the application of advanced monitoring technologies and extensive interpretation of multiple databases. During this encounter critical care time was devoted to patient care services described in this note for 33 minutes.  , DO [facility] 08/10/2020 12:01 PM

## 2020-08-10 NOTE — Progress Notes (Signed)
Progress Note  Patient Name: Miranda Cohen Date of Encounter: 08/10/2020  [facility] Cardiologist: Fransico Him, MD   Subjective   Intubated and sedated.  Off pressors currently.  Opens eyes but not following commands  Inpatient Medications    Scheduled Meds: . chlorhexidine gluconate (MEDLINE KIT)  15 mL Mouth Rinse BID  . Chlorhexidine Gluconate Cloth  6 each Topical Q0600  . docusate  100 mg Per Tube BID  . feeding supplement (PROSource TF)  45 mL Per Tube TID  . feeding supplement (VITAL HIGH PROTEIN)  1,000 mL Per Tube Q24H  . hydrocortisone sod succinate (SOLU-CORTEF) inj  100 mg Intravenous Q8H  . insulin aspart  0-9 Units Subcutaneous Q4H  . ipratropium-albuterol  3 mL Nebulization BID  . levothyroxine  50 mcg Intravenous Daily  . liothyronine  2.5 mcg Per Tube Q8H  . mouth rinse  15 mL Mouth Rinse 10 times per day  . nicotine  21 mg Transdermal Daily  . pantoprazole (PROTONIX) IV  40 mg Intravenous Daily  . polyethylene glycol  17 g Per Tube Daily  . senna  2 tablet Oral BID AC & HS   Continuous Infusions: . sodium chloride 10 mL/hr at 08/10/20 0727  . cefTRIAXone (ROCEPHIN)  IV Stopped (08/09/20 1107)  . fentaNYL infusion INTRAVENOUS 75 mcg/hr (08/10/20 0727)  . heparin 1,000 Units/hr (08/10/20 0727)  . norepinephrine (LEVOPHED) Adult infusion 4 mcg/min (08/10/20 0727)  . propofol (DIPRIVAN) infusion 15 mcg/kg/min (08/10/20 0727)   PRN Meds: albuterol, fentaNYL   Vital Signs    Vitals:   08/10/20 0326 08/10/20 0400 08/10/20 0500 08/10/20 0600  BP: 129/65 114/60    Pulse: 71 65 74 64  Resp: 14 14 14 14   Temp:  98.1 F (36.7 C) 98.2 F (36.8 C) 98.1 F (36.7 C)  TempSrc:      SpO2: 94% 94% 96% 97%  Weight:      Height:        Intake/Output Summary (Last 24 hours) at 08/10/2020 0839 Last data filed at 08/10/2020 0727 Gross per 24 hour  Intake 1529.19 ml  Output 250 ml  Net 1279.19 ml   Last 3 Weights 08/09/2020 08/08/2020 08/06/2020  Weight (lbs)  231 lb 7.7 oz 227 lb 4.7 oz 221 lb 7.2 oz  Weight (kg) 105 kg 103.1 kg 100.45 kg      Telemetry    NSR 50-60s - Personally Reviewed  ECG    No new ECG - Personally Reviewed  Physical Exam   GEN: Intubated and sedated Neck: No JVD appreciated, but difficult to assess given habitus Cardiac: RRR, no murmurs Respiratory: Clear to auscultation in anterior fields GI: Soft, nontender MS: No edema Neuro:  Opens eyes, not following commands Psych: Unable to assess  Labs    High Sensitivity Troponin:   Recent Labs  Lab 08/05/20 1505  TROPONINIHS 19*      Chemistry Recent Labs  Lab 08/05/20 1505 08/06/20 0354 08/07/20 0934 08/07/20 1752 08/08/20 0441 08/08/20 0441 08/08/20 0853 08/09/20 0443 08/10/20 0644  NA 136   < > 137   < > 138   < > 136 136 138  K 5.0   < > 4.9   < > 5.0   < > 4.5 4.4 4.3  CL 94*   < > 99  --  100  --   --  98 101  CO2 33*   < > 28  --  27  --   --  26 27  GLUCOSE 95   < > 102*  --  108*  --   --  134* 102*  BUN 30*   < > 37*  --  45*  --   --  54* 67*  CREATININE 1.66*   < > 1.90*  --  1.92*  --   --  1.98* 1.60*  CALCIUM 8.9   < > 8.2*  --  8.6*  --   --  8.8* 8.5*  PROT 6.7  --  5.7*  --   --   --   --   --   --   ALBUMIN 3.3*  --  2.6*  --   --   --   --   --   --   AST 14*  --  10*  --   --   --   --   --   --   ALT 12  --  12  --   --   --   --   --   --   ALKPHOS 66  --  51  --   --   --   --   --   --   BILITOT 1.3*  --  1.3*  --   --   --   --   --   --   GFRNONAA 30*   < > 25*  --  25*  --   --  24* 31*  GFRAA 34*   < > 29*  --  29*  --   --  28* 36*  ANIONGAP 9   < > 10  --  11  --   --  12 10   < > = values in this interval not displayed.     Hematology Recent Labs  Lab 08/08/20 0441 08/08/20 0441 08/08/20 0853 08/09/20 0443 08/10/20 0644  WBC 10.2  --   --  6.7 5.1  RBC 5.60*  --   --  5.40* 5.07  HGB 15.4*   < > 17.0* 14.7 13.9  HCT 51.8*   < > 50.0* 49.8* 46.9*  MCV 92.5  --   --  92.2 92.5  MCH 27.5  --   --   27.2 27.4  MCHC 29.7*  --   --  29.5* 29.6*  RDW 16.0*  --   --  15.8* 15.7*  PLT 171  --   --  153 122*   < > = values in this interval not displayed.    BNP Recent Labs  Lab 08/05/20 1505  BNP 908.3*     DDimer  Recent Labs  Lab 08/05/20 1700  DDIMER >20.00*     Radiology    DG CHEST PORT 1 VIEW  Result Date: 08/10/2020 CLINICAL DATA:  Respiratory failure. EXAM: PORTABLE CHEST 1 VIEW COMPARISON:  August 06, 2020. FINDINGS: The heart size and mediastinal contours are within normal limits. Endotracheal and nasogastric tubes are unchanged in position. Increased bibasilar interstitial densities are noted concerning for worsening pulmonary edema. No pneumothorax is noted. Small pleural effusions may be present. The visualized skeletal structures are unremarkable. IMPRESSION: Stable support apparatus. Increased bibasilar interstitial densities are noted concerning for worsening pulmonary edema. Electronically Signed   By: Marijo Conception M.D.   On: 08/10/2020 08:18   VAS [place] ABI WITH/WO TBI  Result Date: 08/08/2020 LOWER EXTREMITY DOPPLER STUDY Indications: Decreased pulses and ecchymosis bilateral LE.  Comparison Study: no prior Performing Technologist: Abram Sander RVS  Examination Guidelines: A complete evaluation includes at minimum, Doppler waveform signals and systolic blood pressure reading at the level of bilateral brachial, anterior tibial, and posterior tibial arteries, when vessel segments are accessible. Bilateral testing is considered an integral part of a complete examination. Photoelectric Plethysmograph (PPG) waveforms and toe systolic pressure readings are included as required and additional duplex testing as needed. Limited examinations for reoccurring indications may be performed as noted.  ABI Findings: +---------+------------------+-----+---------+--------------------------+ Right    Rt Pressure (mmHg)IndexWaveform Comment                     +---------+------------------+-----+---------+--------------------------+ Brachial                        triphasicunable to obtain due to iv +---------+------------------+-----+---------+--------------------------+ PTA      104               0.78 triphasic                           +---------+------------------+-----+---------+--------------------------+ DP       94                0.71 biphasic                            +---------+------------------+-----+---------+--------------------------+ Great Toe67                0.50                                     +---------+------------------+-----+---------+--------------------------+ +--------+------------------+-----+---------+-------+ Left    Lt Pressure (mmHg)IndexWaveform Comment +--------+------------------+-----+---------+-------+ [id number]                    triphasic        +--------+------------------+-----+---------+-------+ PTA     113               0.85 triphasic        +--------+------------------+-----+---------+-------+ DP      115               0.86 triphasic        +--------+------------------+-----+---------+-------+ +-------+-----------+-----------+------------+------------+ ABI/TBIToday's ABIToday's TBIPrevious ABIPrevious TBI +-------+-----------+-----------+------------+------------+ Right  0.78       0.50                                +-------+-----------+-----------+------------+------------+ Left   0.86                                           +-------+-----------+-----------+------------+------------+  Summary: Right: Resting right ankle-brachial index indicates moderate right lower extremity arterial disease. The right toe-brachial index is abnormal. Left: Resting left ankle-brachial index indicates mild left lower extremity arterial disease.  *See table(s) above for measurements and observations.  Electronically signed by Deitra Mayo MD on 08/08/2020 at 4:05:44  PM.   Final    ECHOCARDIOGRAM LIMITED  Result Date: 08/08/2020    ECHOCARDIOGRAM LIMITED REPORT   Patient Name:   DAYLANI DEBLOIS Robidoux Date of Exam: 08/08/2020 Medical Rec #:  [id number]       Height:       65.0 in Accession #:  [id number]      Weight:       227.3 lb Date of Birth:  September 13, 1946        BSA:          2.089 m Patient Age:    67 years        BP:           113/60 mmHg Patient Gender: F               HR:           51 bpm. Exam Location:  Inpatient Procedure: Limited Echo, Cardiac Doppler and Color Doppler Indications:    Pericardial effusion 423.9 / I31.3  History:        Patient has prior history of Echocardiogram examinations, most                 recent 08/06/2020. Risk Factors:Current Smoker. Acute respiratory                 failure.  Sonographer:    Darlina Sicilian RDCS Referring Phys: [id number] [place]  Sonographer Comments: Echo performed with patient supine and on artificial respirator. IMPRESSIONS  1. Left ventricular ejection fraction, by estimation, is 60 to 65%. The left ventricle has normal function. The left ventricle has no regional wall motion abnormalities. The average left ventricular global longitudinal strain is -19.3 %. The global longitudinal strain is normal.  2. Right ventricular systolic function is normal. The right ventricular size is mildly enlarged. Tricuspid regurgitation signal is inadequate for assessing PA pressure.  3. Moderate pericardial effusion. The pericardial effusion is circumferential. The majority of the effusion is posterior measuring 2.26cm in greatest diameter posterior to the LV.     Anterior to the RV there is a thickened appearing mass within the pericardium. This could be a fat pad but it is thicker than would usually be seen. Recommend cardiac MRI.  4. The mitral valve is normal in structure. No evidence of mitral valve regurgitation. No evidence of mitral stenosis.  5. The aortic valve is normal in structure. Aortic valve regurgitation is not  visualized. No aortic stenosis is present.  6. The inferior vena cava is dilated in size with <50% respiratory variability, suggesting right atrial pressure of 15 mmHg. FINDINGS  Left Ventricle: Left ventricular ejection fraction, by estimation, is 60 to 65%. The left ventricle has normal function. The left ventricle has no regional wall motion abnormalities. The average left ventricular global longitudinal strain is -19.3 %. The global longitudinal strain is normal. The left ventricular internal cavity size was normal in size. There is no left ventricular hypertrophy. Right Ventricle: The right ventricular size is mildly enlarged. No increase in right ventricular wall thickness. Right ventricular systolic function is normal. Tricuspid regurgitation signal is inadequate for assessing PA pressure. Left Atrium: Left atrial size was normal in size. Right Atrium: Right atrial size was normal in size. Pericardium: The majority of the effusion is posterior measuring 2.26cm in greatest diameter posterior to the LV. Anterior to the RV there is a thickened appearing mass within the pericardium. This could be a fat pad but it is thicker than would usually be seen. Recommend cardiac MRI. A moderately sized pericardial effusion is present. The pericardial effusion is circumferential. Mitral Valve: The mitral valve is normal in structure. Normal mobility of the mitral valve leaflets. Mild to moderate mitral annular calcification. No evidence of mitral valve stenosis. Tricuspid Valve: The tricuspid valve is normal in structure. Tricuspid valve  regurgitation is not demonstrated. No evidence of tricuspid stenosis. Aortic Valve: The aortic valve is normal in structure. Aortic valve regurgitation is not visualized. No aortic stenosis is present. Pulmonic Valve: The pulmonic valve was normal in structure. Pulmonic valve regurgitation is not visualized. No evidence of pulmonic stenosis. Aorta: The aortic root is normal in size and  structure. Venous: The inferior vena cava is dilated in size with less than 50% respiratory variability, suggesting right atrial pressure of 15 mmHg. IAS/Shunts: No atrial level shunt detected by color flow Doppler. LEFT VENTRICLE PLAX 2D LVOT diam:     1.80 cm LV SV:         63       2D Longitudinal Strain LV SV Index:   30       2D Strain GLS (A2C):   -20.2 % LVOT Area:     2.54 cm 2D Strain GLS (A3C):   -20.3 %                         2D Strain GLS (A4C):   -17.5 %                         2D Strain GLS Avg:     -19.3 % RIGHT VENTRICLE TAPSE (M-mode): 1.0 cm RIGHT ATRIUM           Index RA Area:     17.10 cm RA Volume:   46.80 ml  22.41 ml/m  AORTIC VALVE LVOT Vmax:   94.20 cm/s LVOT Vmean:  61.700 cm/s LVOT VTI:    0.247 m  SHUNTS Systemic VTI:  0.25 m Systemic Diam: 1.80 cm Fransico Him MD Electronically signed by Fransico Him MD Signature Date/Time: 08/08/2020/11:32:37 AM    Final     Cardiac Studies   Echo 08/06/20: 1. Left ventricular ejection fraction, by estimation, is 70 to 75%. The  left ventricle has hyperdynamic function. The left ventricle has no  regional wall motion abnormalities. There is severe concentric left  ventricular hypertrophy. Left ventricular  diastolic parameters are indeterminate.  2. Right ventricular systolic function was not well visualized. The right  ventricular size is not well visualized.  3. Large pericardial effusion. The pericardial effusion is posterior to  the left ventricle.  4. The mitral valve is grossly normal. Trivial mitral valve  regurgitation.  5. The aortic valve is tricuspid. Aortic valve regurgitation is not  visualized. No aortic stenosis is present.   Conclusion(s)/Recommendation(s): Difficult images, RV not well visualized  but appears grossly normal on subcostal images. There is a large  pericardial effusion, with largest dimension posterior to LV measured as  2.55 cm. No clear respiratory variation,  but patient is on ventilator.  No clear RA or RV diastolic collapse seen.  No apparent tamponade by echo parameters, but recommend clinical  correlation.   Patient Profile     74 y.o. female with PMHx tobacco abuse presents with acute hypoxic respiratory failure  Assessment & Plan    Pericardial effusion: Large posterior effusion on echo, no evidence of tamponade.  Suspect secondary to hypothyroidism, TSH 63.  Repeat Echo 9/7 showed stable effusion. -Would expect effusion to improve with treatment of hypothyroidism.  Effusion appears stable on repeat echo, no evidence of tamponade.  Would plan repeat limited echo in 2 weeks to monitor. Would repeat sooner if patient develops hypotension or any clinical concern for tamponade.  ?Cardiac mass: echo 9/7 read as  concerning for mass anterior to RV.  I think this is a prominent fat pad; reviewed on recent CT chest and Hounsfield units consistent with fat.  No further workup recommended.  PE: small RLL PE, on heparin gtt   [facility] will sign off.   Medication Recommendations:  None Other recommendations (labs, testing, etc):  Repeat limited echo in 2 weeks to monitor pericardial effusion Follow up as an outpatient:  Will schedule outpatient follow-up   For questions or updates, please contact [place] Please consult [email or web address] for contact info under        Signed, Donato Heinz, MD  08/10/2020, 8:39 AM

## 2020-08-10 NOTE — Procedures (Signed)
Extubation Procedure Note  Patient Details:   Name: Miranda Cohen DOB: 1946-07-03 MRN: [id number]   Airway Documentation:    Vent end date: 08/10/20 Vent end time: 1050   Evaluation  O2 sats: stable throughout Complications: No apparent complications Patient did tolerate procedure well. Bilateral Breath Sounds: Clear, Diminished   Yes   Patient extubated to 6L [place] per MD order.  Positive cuff leak noted.  No evidence of stridor.  Patient able to speak post extubation.  Sats currently 91%.  Vitals are stable.  No complications noted.    Elyn Peers 08/10/2020, 10:59 AM

## 2020-08-10 NOTE — Progress Notes (Signed)
ANTICOAGULATION CONSULT NOTE - Follow Up Consult  Pharmacy Consult for Heparin Indication: pulmonary embolus  No Known Allergies  Patient Measurements: Height: 5\' 5"  (165.1 cm) Weight: 105 kg (231 lb 7.7 oz) IBW/kg (Calculated) : 57 Heparin Dosing Weight: 103kg  Vital Signs: Temp: 98.1 F (36.7 C) (09/09 0600) BP: 114/60 (09/09 0400) Pulse Rate: 64 (09/09 0600)  Labs: Recent Labs    08/07/20 0934 08/07/20 1752 08/08/20 0441 08/08/20 0441 08/08/20 0853 08/08/20 0853 08/08/20 1354 08/08/20 2234 08/09/20 0443 08/09/20 1152 08/10/20 0644  HGB  --    < > 15.4*   < > 17.0*   < >  --   --  14.7  --  13.9  HCT  --    < > 51.8*   < > 50.0*  --   --   --  49.8*  --  46.9*  PLT  --   --  171  --   --   --   --   --  153  --  122*  HEPARINUNFRC  --   --  0.79*  --   --   --    < > 0.54 0.57  --  0.53  CREATININE 1.90*  --  1.92*  --   --   --   --   --  1.98*  --   --   CKTOTAL  --   --   --   --   --   --   --   --   --  61  --    < > = values in this interval not displayed.    Estimated Creatinine Clearance: 29.1 mL/min (A) (by C-G formula based on SCr of 1.98 mg/dL (H)).   Assessment: 72 yof who presented to the ED with shortness of breath and found to have small pulmonary embolism.   Heparin level therapeutic (0.53) on gtt at 1000 units/hr. Patient has been therapeutic x4 at this time. No bleeding noted upon examination and IV site is secure. Patient's CBC is stable at this time. Monitor platelets due to slow downtrend.   Goal of Therapy:  Heparin level 0.3-0.7 units/ml Monitor platelets by anticoagulation protocol: Yes   Plan:  Continue heparin at 1000 units/hr Daily CBC and HL  Monitor for s/sx of bleeding   71, PharmD, MBA Pharmacy Resident [phone] 08/10/2020 7:30 AM

## 2020-08-11 DIAGNOSIS — J449 Chronic obstructive pulmonary disease, unspecified: Secondary | ICD-10-CM

## 2020-08-11 LAB — GLUCOSE, CAPILLARY
Glucose-Capillary: 105 mg/dL — ABNORMAL HIGH (ref 70–99)
Glucose-Capillary: 103 mg/dL — ABNORMAL HIGH (ref 70–99)
Glucose-Capillary: 99 mg/dL (ref 70–99)
Glucose-Capillary: 83 mg/dL (ref 70–99)
Glucose-Capillary: 97 mg/dL (ref 70–99)

## 2020-08-11 LAB — BASIC METABOLIC PANEL
Anion gap: 10 (ref 5–15)
BUN: 56 mg/dL — ABNORMAL HIGH (ref 8–23)
CO2: 28 mmol/L (ref 22–32)
Calcium: 9 mg/dL (ref 8.9–10.3)
Chloride: 101 mmol/L (ref 98–111)
Creatinine, Ser: 1.39 mg/dL — ABNORMAL HIGH (ref 0.44–1.00)
GFR calc Af Amer: 43 mL/min — ABNORMAL LOW (ref >60)
GFR calc non Af Amer: 37 mL/min — ABNORMAL LOW (ref >60)
Glucose, Bld: 103 mg/dL — ABNORMAL HIGH (ref 70–99)
Potassium: 4 mmol/L (ref 3.5–5.1)
Sodium: 139 mmol/L (ref 135–145)

## 2020-08-11 LAB — CBC
HCT: 50.9 % — ABNORMAL HIGH (ref 36.0–46.0)
Hemoglobin: 15.4 g/dL — ABNORMAL HIGH (ref 12.0–15.0)
MCH: 28.1 pg (ref 26.0–34.0)
MCHC: 30.3 g/dL (ref 30.0–36.0)
MCV: 92.7 fL (ref 80.0–100.0)
Platelets: 112 10*3/uL — ABNORMAL LOW (ref 150–400)
RBC: 5.49 MIL/uL — ABNORMAL HIGH (ref 3.87–5.11)
RDW: 15.6 % — ABNORMAL HIGH (ref 11.5–15.5)
WBC: 7.7 10*3/uL (ref 4.0–10.5)
nRBC: 0 % (ref 0.0–0.2)

## 2020-08-11 LAB — TRIGLYCERIDES: Triglycerides: 146 mg/dL (ref <150)

## 2020-08-11 LAB — HEPARIN LEVEL (UNFRACTIONATED)
Heparin Unfractionated: 0.11 IU/mL — ABNORMAL LOW (ref 0.30–0.70)
Heparin Unfractionated: 0.24 IU/mL — ABNORMAL LOW (ref 0.30–0.70)
Heparin Unfractionated: 0.55 IU/mL (ref 0.30–0.70)

## 2020-08-11 LAB — CULTURE, BLOOD (ROUTINE X 2): Culture: NO GROWTH

## 2020-08-11 LAB — T3, FREE: T3, Free: 1.2 pg/mL — ABNORMAL LOW (ref 2.0–4.4)

## 2020-08-11 MED ORDER — BOOST / RESOURCE BREEZE PO LIQD CUSTOM
1.0000 | Freq: Two times a day (BID) | ORAL | Status: DC
  Administered 2020-08-11 – 2020-08-12 (×2): 1 via ORAL

## 2020-08-11 MED ORDER — FUROSEMIDE 10 MG/ML IJ SOLN
40.0000 mg | Freq: Once | INTRAMUSCULAR | Status: AC
  Administered 2020-08-11: 40 mg via INTRAVENOUS
  Filled 2020-08-11: qty 4

## 2020-08-11 MED ORDER — HYDROCORTISONE NA SUCCINATE PF 100 MG IJ SOLR
100.0000 mg | Freq: Two times a day (BID) | INTRAMUSCULAR | Status: DC
  Administered 2020-08-11 – 2020-08-17 (×12): 100 mg via INTRAVENOUS
  Filled 2020-08-11 (×13): qty 2

## 2020-08-11 MED ORDER — HYDRALAZINE HCL 20 MG/ML IJ SOLN
10.0000 mg | INTRAMUSCULAR | Status: DC | PRN
  Administered 2020-08-11: 10 mg via INTRAVENOUS
  Filled 2020-08-11: qty 1

## 2020-08-11 MED ORDER — PROMETHAZINE HCL 25 MG/ML IJ SOLN
6.2500 mg | Freq: Four times a day (QID) | INTRAMUSCULAR | Status: DC | PRN
  Administered 2020-08-11 (×2): 6.25 mg via INTRAVENOUS
  Filled 2020-08-11 (×3): qty 1

## 2020-08-11 MED ORDER — ONDANSETRON HCL 4 MG/2ML IJ SOLN: 4.0000 mg | Freq: Four times a day (QID) | INTRAMUSCULAR | Status: DC | PRN

## 2020-08-11 NOTE — Progress Notes (Signed)
Rehab Admissions Coordinator Note:  Patient was screened by Clois Dupes for appropriateness for an Inpatient Acute Rehab Consult. Per therapy recs.  At this time, we are recommending inpatient rehab consult once tolerance improves.  Clois Dupes RN MSN 08/11/2020, 1:50 PM  I can be reached at [phone].

## 2020-08-11 NOTE — Progress Notes (Signed)
NAME:  Miranda Cohen, MRN:  [id number], DOB:  17-Jan-1946, LOS: 6 ADMISSION DATE:  08/05/2020, CONSULTATION DATE:  08/11/20 REFERRING MD:  Julian Reil, CHIEF COMPLAINT:  Hypercapnic respiratory failure   Brief History   74 y.o. F with PMH of tobacco use who presented with exertional shortness of breath, cough, diarrhea, fatigue since 8/1.  Work-up significant for small PE, hypercarbia, likely COPD exacerbation.  Bipap was initiated, however hypercarbia failed to significantly improve and pt became more lethargic so PCCM consulted.  History of present illness   Miranda Cohen is a 74 y.o. F with PMH of prolonged tobacco use, states has been smoking 1ppd for most of her life, who began feeling more dyspneic on exertion approximately one month ago. Over the last few days, she also developed diarrhea without nausea and vomiting  She presented to the ED several weeks ago, but left AMA.  She denied fevers, chills or significant leg edema  EMS reportedly found patient with oxygen sats in the 50%'s on RA, she was brought to the ED and placed on nasal cannula initially.  CXR with LLL infiltrate vs atelectasis, Covid-19 negative, BNP 900, Procal negative. CT chest with small RLL PE, cardiomegaly and moderate pericardial effusion, evidence of PAH and COPD.   ABG results pH 7.1, pCO2 115, pO2 87 bicarb 35.  She was admitted to the hospitalists and started on Bipap, heparin, and inhalers. Pt's repeat ABG showed no significant improvement and she remained lethargic, so PCCM consulted.  Past Medical History  Tobacco abuse  Significant Hospital Events   9/4 Admit to Hospitalists, ICU txfr 9/5 intubated 9/9 extubated  Consults:  PCCM  Procedures:  9/5 ETT 9/5 Right femoral artery cathter > 9/10  Significant Diagnostic Tests:  9/4 CXR>>The lower portion of the left lung and left cardiac border are obscured by dense consolidation or pleural effusion. 9/4 CT chest>>small RLL PE, cardiomegaly and moderate  pericardial effusion 9/5 ECHO >> LVEF 70-75%, large pericardial effusion 9/5 LE dopplers >> negative for DVT bilaterally 9/6 CT chest wo contrast >> moderate pericardial effusion, small pleural effusions b/l, stable diffuse enlargement of thyroid gland. 9/7 ABI >> moderate RLE and mild LLE arterial disease.  Micro Data:  9/4 Sars-CoV-2>>negative 9/4 BCx2>> 9/4 MRSA screen>>negative 9/4 repeat covid>>negative 9/5 BCx2>> 9/5 Respiratory culture >> moderate GPC in clusters, few GNR  Antimicrobials:  9/5 Cefepime >> 9/7 9/5 Vancomycin >> 9/5 9/8 Ceftriaxone >> 9/9  Interim history/subjective:  Tolerated extubation well. No acute events.  Objective   Blood pressure 140/72, pulse 94, temperature (!) 97.3 F (36.3 C), resp. rate (!) 28, height 5\' 5"  (1.651 m), weight 107.6 kg, SpO2 95 %.    FiO2 (%):  [40 %] 40 %   Intake/Output Summary (Last 24 hours) at 08/11/2020 1011 Last data filed at 08/11/2020 0900 Gross per 24 hour  Intake 715.25 ml  Output 824 ml  Net -108.75 ml   Filed Weights   08/08/20 0700 08/09/20 0500 08/11/20 0415  Weight: 103.1 kg 105 kg 107.6 kg   Physical Exam: General: Adult female, resting in bed, in NAD. Neuro: Awake but slightly confused, MAE's. HEENT: [place]/AT. Sclerae anicteric. EOMI. Cardiovascular: RRR, no M/R/G.  Lungs: Respirations even and unlabored.  CTA bilaterally, No W/R/R.  Abdomen: BS x 4, soft, NT/ND.  Musculoskeletal: No gross deformities, no edema.  Skin: Intact, warm, no rashes.  Assessment & Plan:   Possible myxedema coma 2/2 severe hypothyroidism in the setting of untreated Chronic Lymphocytic Thyroiditis (TSH 63, Free T4  0.4, Free T3 0.9, TPO Ab >600, AM cortisol 4.5).  Likely provoked by hypercapnia in the setting of untreated/undiagnosed COPD.  Now resolving. -continue IV levo daily. -Can d/c IV liothyronine given improvement. -On day 4 of IV hydrocortisone for adrenal insufficiency in the setting of severe hypothyroidism,  drop from 100mg  q8hrs to 100mg  q12hrs and continue to wean. -Assess free T4 and free T3 every 1-2 days.  Acute Hypoxemic and Hypercapnic Respiratory Failure - s/p intubation 9/5 and s/p extubation 9/9. - Supportive care. - Avoid sedating meds.  Tobacco dependence. - Nicotine patch. - Tobacco cessation counseling.  Pericardial effusion, likely 2/2 severe hypothyroidism.   Echo showing LVEF 70-75%, large posterior pericardial effusion, no evidence of tamponade. - Cards recommends continued treatment of hypothyroidism. - If effusion worsens or evidence of tamponade arises, will need to consult cardiac surgery for pericardial window.  Small, RLL PE. Echo reassuring and LE duplex neg. - Continue heparin gtt.  Acute Kidney Injury - gradually improving. - Supportive care.  Polycythemia, resolved.  Likely secondary to chronic hypoxemia from underlying obstructive lung disease -  Supportive care.  Nausea with vomiting. - Phenergan PRN.  Stable for transfer out of ICU.  Will ask [facility] to assume care in AM 9/11 with PCCM off at that time.  Best practice:  Diet: Reg Pain/Anxiety/Delirium protocol (if indicated): None VAP protocol (if indicated): None DVT prophylaxis: heparin GI prophylaxis: protonix Glucose control: SSI Mobility: bed rest Code Status: full code Family Communication: None available. Disposition: Transfer out of ICU to progressive.  TR[facility] to pick up in AM 9/11 with PCCM off at that time.  11/11, 11/11 [facility] 08/11/2020, 10:24 AM

## 2020-08-11 NOTE — Progress Notes (Signed)
ANTICOAGULATION CONSULT NOTE - Follow Up Consult  Pharmacy Consult for Heparin Indication: pulmonary embolus  No Known Allergies  Patient Measurements: Height: 5\' 5"  (165.1 cm) Weight: 107.6 kg (237 lb 3.4 oz) IBW/kg (Calculated) : 57 Heparin Dosing Weight: 103kg  Vital Signs: Temp: 97.5 F (36.4 C) (09/10 1449) Temp Source: Oral (09/10 1449) BP: 151/80 (09/10 1449) Pulse Rate: 76 (09/10 1449)  Labs: Recent Labs    08/09/20 0443 08/09/20 0443 08/09/20 1152 08/10/20 0644 08/10/20 0644 08/11/20 0416 08/11/20 0502 08/11/20 1412  HGB 14.7   < >  --  13.9  --  15.4*  --   --   HCT 49.8*  --   --  46.9*  --  50.9*  --   --   PLT 153  --   --  122*  --  112*  --   --   HEPARINUNFRC 0.57   < >  --  0.53   < > 0.11* 0.24* 0.55  CREATININE 1.98*  --   --  1.60*  --  1.39*  --   --   CKTOTAL  --   --  61  --   --   --   --   --    < > = values in this interval not displayed.    Estimated Creatinine Clearance: 42 mL/min (A) (by C-G formula based on SCr of 1.39 mg/dL (H)).   Assessment: 32 yof who presented to the ED with shortness of breath and found to have small pulmonary embolism.   Heparin level therapeutic (0.55) on gtt at 1100 units/hr. .   Goal of Therapy:  Heparin level 0.3-0.7 units/ml Monitor platelets by anticoagulation protocol: Yes   Plan:  Continue heparin at 1100 units/hr  Heparin level in 8 hours to confirm Daily CBC and HL  Monitor for s/sx of bleeding   Lucienne Sawyers A. 71, PharmD, BCPS, FNKF Clinical Pharmacist [place] Please utilize Amion for appropriate phone number to reach the unit pharmacist [facility])   08/11/2020 3:31 PM

## 2020-08-11 NOTE — Evaluation (Signed)
Occupational Therapy Evaluation Patient Details Name: Miranda Cohen MRN: [id number] DOB: 21-Aug-1946 Today's Date: 08/11/2020    History of Present Illness 74 y.o. F with PMH of tobacco use who presented with exertional shortness of breath, cough, diarrhea, fatigue since 8/1.  Work-up significant for small PE, hypercarbia, likely COPD exacerbation.  Bipap was initiated, however hypercarbia failed to significantly improve and pt became more lethargic, pt was intubated 9/5, extubated 9/9   Clinical Impression   This 74 yo female admitted with above presents to acute OT with PLOF per her report of being totally independent with basic ADLs, most IADLs, and driving. Currently she is +2 A for all mobility (total-Mod A) and setup/total A with all basic ADLs. She will benefit from acute OT with follow up on CIR.    Follow Up Recommendations  CIR;Supervision/Assistance - 24 hour    Equipment Recommendations  Other (comment) (TBD next venue)       Precautions / Restrictions Precautions Precautions: Fall Restrictions Weight Bearing Restrictions: No      Mobility Bed Mobility Overal bed mobility: Needs Assistance Bed Mobility: Supine to Sit     Supine to sit: Max assist;+2 for physical assistance;HOB elevated     General bed mobility comments: A for legs, trunk and to scoot forward to EOB. Used bed pad as A as well.  Transfers Overall transfer level: Needs assistance Equipment used: Standard walker Transfers: Sit to/from [employer] Transfers Sit to Stand: Mod assist;Total assist;+2 physical assistance Stand pivot transfers: Mod assist;+2 physical assistance       General transfer comment: First attempt at sit<>stand pt just barely came up (total A +2), second attempt with counting and initially A'ing with bed pad pt Mod A +2. For stand pivot pt needed A to [facility] SW, to hold SW and cue her to move feet, and directional cues for stepping (pt with narrow base of support).     Balance Overall balance assessment: Needs assistance Sitting-balance support: Bilateral upper extremity supported;Feet supported Sitting balance-Leahy Scale: Poor Sitting balance - Comments: could maintain EOB intermittently without UE support   Standing balance support: Bilateral upper extremity supported Standing balance-Leahy Scale: Poor Standing balance comment: narrow base of support                           ADL either performed or assessed with clinical judgement   ADL Overall ADL's : Needs assistance/impaired Eating/Feeding: Set up;Supervision/ safety Eating/Feeding Details (indicate cue type and reason): supported sitting Grooming: Set up;Supervision/safety Grooming Details (indicate cue type and reason): supported sitting Upper Body Bathing: Minimal assistance Upper Body Bathing Details (indicate cue type and reason): supported sitting Lower Body Bathing: Total assistance Lower Body Bathing Details (indicate cue type and reason): Mod A +2 sit<>stand Upper Body Dressing : Moderate assistance Upper Body Dressing Details (indicate cue type and reason): supported sitting Lower Body Dressing: Total assistance Lower Body Dressing Details (indicate cue type and reason): Mod A +2 sit<>stand Toilet Transfer: Moderate assistance;+2 for physical assistance;Stand-pivot Toilet Transfer Details (indicate cue type and reason): SW, bed (step up towards [facility] a few steps, the turn to sit in recliner) Toileting- Clothing Manipulation and Hygiene: Total assistance Toileting - Clothing Manipulation Details (indicate cue type and reason): Mod A sit<>stand             Vision Baseline Vision/History: Wears glasses Wears Glasses: Reading only Patient Visual Report: No change from baseline  Pertinent Vitals/Pain Pain Assessment: No/denies pain     Hand Dominance Right   Extremity/Trunk Assessment Upper Extremity Assessment Upper Extremity Assessment:  Generalized weakness           Communication Communication Communication: No difficulties   Cognition Arousal/Alertness: Awake/alert Behavior During Therapy: Impulsive Overall Cognitive Status: Impaired/Different from baseline                                 General Comments: Pt was totally oriented, but when conversing with her she would go off on tangents that did not make any sense at all and/or hard to understand              Home Living Family/patient expects to be discharged to:: Private residence Living Arrangements: Children Available Help at Discharge: Family;Available 24 hours/day Type of Home: House Home Access: Stairs to enter [employer] of Steps: 4   Home Layout: One level               Home Equipment: Walker - 2 wheels          Prior Functioning/Environment Level of Independence: Independent        Comments: including driving per pt        OT Problem List: Impaired balance (sitting and/or standing);Decreased safety awareness;Decreased cognition;Obesity      OT Treatment/Interventions: Self-care/ADL training;DME and/or AE instruction;Patient/family education;Balance training;Therapeutic activities    OT Goals(Current goals can be found in the care plan section) Acute Rehab OT Goals Patient Stated Goal: to go home OT Goal Formulation: With patient Time For Goal Achievement: 08/25/20 Potential to Achieve Goals: Good  OT Frequency: Min 2X/week              AM-PAC OT "6 Clicks" Daily Activity     Outcome Measure Help from another person eating meals?: A Little Help from another person taking care of personal grooming?: A Little Help from another person toileting, which includes using toliet, bedpan, or urinal?: A Lot Help from another person bathing (including washing, rinsing, drying)?: A Lot Help from another person to put on and taking off regular upper body clothing?: A Lot Help from another person to put  on and taking off regular lower body clothing?: Total 6 Click Score: 13   End of Session Equipment Utilized During Treatment: Gait belt;Oxygen (SW, 12 liters HFNC) Nurse Communication: Mobility status  Activity Tolerance: Patient tolerated treatment well Patient left: in chair;with call bell/phone within reach;with chair alarm set  OT Visit Diagnosis: Unsteadiness on feet (R26.81);Other abnormalities of gait and mobility (R26.89);Muscle weakness (generalized) (M62.81);Other symptoms and signs involving cognitive function                Time: 4742-5956 OT Time Calculation (min): 24 min Charges:  OT General Charges $OT Visit: 1 Visit OT Evaluation $OT Eval Moderate Complexity: 1 Mod  Ignacia Palma, OTR/L Acute [employer] Pager [phone] Office [phone]     Evette Georges 08/11/2020, 10:42 AM

## 2020-08-11 NOTE — Progress Notes (Signed)
ANTICOAGULATION CONSULT NOTE - Follow Up Consult  Pharmacy Consult for Heparin Indication: pulmonary embolus  No Known Allergies  Patient Measurements: Height: 5\' 5"  (165.1 cm) Weight: 107.6 kg (237 lb 3.4 oz) IBW/kg (Calculated) : 57 Heparin Dosing Weight: 103kg  Vital Signs: Temp: 97.2 F (36.2 C) (09/10 0600) BP: 183/79 (09/10 0713) Pulse Rate: 81 (09/10 0600)  Labs: Recent Labs    08/09/20 0443 08/09/20 0443 08/09/20 1152 08/10/20 0644 08/11/20 0416 08/11/20 0502  HGB 14.7   < >  --  13.9 15.4*  --   HCT 49.8*  --   --  46.9* 50.9*  --   PLT 153  --   --  122* 112*  --   HEPARINUNFRC 0.57   < >  --  0.53 0.11* 0.24*  CREATININE 1.98*  --   --  1.60* 1.39*  --   CKTOTAL  --   --  61  --   --   --    < > = values in this interval not displayed.    Estimated Creatinine Clearance: 42 mL/min (A) (by C-G formula based on SCr of 1.39 mg/dL (H)).   Assessment: 55 yof who presented to the ED with shortness of breath and found to have small pulmonary embolism.   Heparin level subtherapeutic (0.11) on gtt at 1000 units/hr. Requested recheck of heparin level which was subtherapeutic (0.24). No bleeding noted upon examination and IV site is secure. Patient's CBC is stable at this time and renal function has improved which could have impacted heparin level. Monitor platelets due to slow downtrend.   Goal of Therapy:  Heparin level 0.3-0.7 units/ml Monitor platelets by anticoagulation protocol: Yes   Plan:  Increase heparin to 1100 units/hr  Heparin level scheduled for 1430 to monitor for adjsutments Daily CBC and HL  Monitor for s/sx of bleeding   71, PharmD, [facility] Resident [phone] 08/11/2020 8:03 AM

## 2020-08-11 NOTE — Progress Notes (Signed)
PT Cancellation Note  Patient Details Name: Miranda Cohen MRN: [id number] DOB: 14-Sep-1946   Cancelled Treatment:    Reason Eval/Treat Not Completed: Patient not medically ready. Pt extubated 9/9 but R femoral arterial line remains in place. Will await removal prior to initiation of PT.   Ilda Foil 08/11/2020, 8:12 AM   Aida Raider, PT  Office # [phone] Pager [phone]

## 2020-08-11 NOTE — Progress Notes (Signed)
eLink Physician-Brief Progress Note Patient Name: Miranda Cohen DOB: 1946-09-18 MRN: [id number]   Date of Service  08/11/2020  HPI/Events of Note  Patient states that Zofran makes her loopy.  eICU Interventions  Zofran discontinued, Phenergan 6.25 mg iv Q 6 hours PRN nausea.        Thomasene Lot Gardner Servantes 08/11/2020, 12:55 AM

## 2020-08-11 NOTE — Plan of Care (Signed)
  Problem: Activity: Goal: Ability to tolerate increased activity will improve Outcome: Progressing   Problem: Respiratory: Goal: Ability to maintain a clear airway and adequate ventilation will improve Outcome: Progressing   Problem: Role Relationship: Goal: Method of communication will improve Outcome: Progressing   Problem: Education: Goal: Knowledge of disease or condition will improve Outcome: Progressing Goal: Knowledge of the prescribed therapeutic regimen will improve Outcome: Progressing Goal: Individualized Educational Video(s) Outcome: Progressing   Problem: Activity: Goal: Ability to tolerate increased activity will improve Outcome: Progressing Goal: Will verbalize the importance of balancing activity with adequate rest periods Outcome: Progressing   Problem: Respiratory: Goal: Ability to maintain a clear airway will improve Outcome: Progressing Goal: Levels of oxygenation will improve Outcome: Progressing Goal: Ability to maintain adequate ventilation will improve Outcome: Progressing   Problem: Education: Goal: Ability to describe self-care measures that may prevent or decrease complications (Diabetes Survival Skills Education) will improve Outcome: Progressing Goal: Individualized Educational Video(s) Outcome: Progressing   Problem: Coping: Goal: Ability to adjust to condition or change in health will improve Outcome: Progressing   Problem: Fluid Volume: Goal: Ability to maintain a balanced intake and output will improve Outcome: Progressing   Problem: Health Behavior/Discharge Planning: Goal: Ability to identify and utilize available resources and services will improve Outcome: Progressing Goal: Ability to manage health-related needs will improve Outcome: Progressing   Problem: Metabolic: Goal: Ability to maintain appropriate glucose levels will improve Outcome: Progressing   Problem: Nutritional: Goal: Maintenance of adequate nutrition will  improve Outcome: Progressing Goal: Progress toward achieving an optimal weight will improve Outcome: Progressing   Problem: Skin Integrity: Goal: Risk for impaired skin integrity will decrease Outcome: Progressing   Problem: Tissue Perfusion: Goal: Adequacy of tissue perfusion will improve Outcome: Progressing   Problem: Activity: Goal: Ability to tolerate increased activity will improve Outcome: Progressing   Problem: Respiratory: Goal: Ability to maintain a clear airway and adequate ventilation will improve Outcome: Progressing   Problem: Role Relationship: Goal: Method of communication will improve Outcome: Progressing   Problem: Education: Goal: Knowledge of General Education information will improve Description: Including pain rating scale, medication(s)/side effects and non-pharmacologic comfort measures Outcome: Progressing   Problem: Health Behavior/Discharge Planning: Goal: Ability to manage health-related needs will improve Outcome: Progressing   Problem: Clinical Measurements: Goal: Ability to maintain clinical measurements within normal limits will improve Outcome: Progressing Goal: Will remain free from infection Outcome: Progressing Goal: Diagnostic test results will improve Outcome: Progressing Goal: Respiratory complications will improve Outcome: Progressing Goal: Cardiovascular complication will be avoided Outcome: Progressing   Problem: Activity: Goal: Risk for activity intolerance will decrease Outcome: Progressing   Problem: Nutrition: Goal: Adequate nutrition will be maintained Outcome: Progressing   Problem: Coping: Goal: Level of anxiety will decrease Outcome: Progressing   Problem: Elimination: Goal: Will not experience complications related to bowel motility Outcome: Progressing Goal: Will not experience complications related to urinary retention Outcome: Progressing   Problem: Pain Managment: Goal: General experience of  comfort will improve Outcome: Progressing   Problem: Safety: Goal: Ability to remain free from injury will improve Outcome: Progressing   Problem: Skin Integrity: Goal: Risk for impaired skin integrity will decrease Outcome: Progressing

## 2020-08-11 NOTE — Progress Notes (Signed)
eLink Physician-Brief Progress Note Patient Name: Miranda Cohen DOB: 1946/02/05 MRN: [id number]   Date of Service  08/11/2020  HPI/Events of Note  Hypertension.  eICU Interventions  PRN Hydralazine ordered.        Migdalia Dk 08/11/2020, 6:47 AM

## 2020-08-11 NOTE — Progress Notes (Signed)
Nutrition Follow-up  DOCUMENTATION CODES:   Not applicable  INTERVENTION:    Boost Breeze po BID, each supplement provides 250 kcal and 9 grams of protein  NUTRITION DIAGNOSIS:   Inadequate oral intake related to vomiting as evidenced by per patient/family report.  GOAL:   Patient will meet greater than or equal to 90% of their needs  MONITOR:   PO intake, Diet advancement, Labs, Supplement acceptance  REASON FOR ASSESSMENT:   Consult Assessment of nutrition requirement/status  ASSESSMENT:   74 yo female admitted with SOB, required intubation shortly after admission. Work-up revealed small PE, suspected COPD exacerbation. PMH includes tobacco abuse.   Discussed patient in ICU rounds and with RN today. Extubated 9/9. TF off. Currently on clear liquids.  Per RN, vomited multiple times last night. Antiemetic given this AM and no vomiting since then. Will add PO supplement to maximize intake when able to tolerate POs.  Labs reviewed.  CBG: 105-103-99  Medications reviewed and include solu-cortef, novolog, senokot.  I/O + 3,405 ml since admission UOP 981 ml x 24 hours  Weight up to 107.6 kg today.  Diet Order:   Diet Order            Diet clear liquid Room service appropriate? Yes; Fluid consistency: Thin  Diet effective now                 EDUCATION NEEDS:   Not appropriate for education at this time  Skin:  Skin Assessment: Reviewed RN Assessment (MASD to hip & abdomen)  Last BM:  9/9  Height:   Ht Readings from Last 1 Encounters:  08/05/20 5\' 5"  (1.651 m)    Weight:   Wt Readings from Last 1 Encounters:  08/11/20 107.6 kg    Ideal Body Weight:  56.8 kg  BMI:  Body mass index is 39.47 kg/m.  Estimated Nutritional Needs:   Kcal:  1700-1900  Protein:  100-110 gm  Fluid:  >/= 1.7 L    10/11/20, RD, LDN, CNSC Please refer to Amion for contact information.

## 2020-08-11 NOTE — Progress Notes (Signed)
OT Cancellation Note  Patient Details Name: Miranda Cohen MRN: [id number] DOB: December 23, 1945   Cancelled Treatment:    Reason Eval/Treat Not Completed: Patient not medically ready. Pt extubated, but still with femoral line. Will have to wait until this is pulled before we can work with the patient.   Ignacia Palma, OTR/L Acute Rehab Services Pager [phone] Office [phone]     Evette Georges 08/11/2020, 8:09 AM

## 2020-08-11 NOTE — Evaluation (Signed)
Physical Therapy Evaluation Patient Details Name: Miranda Cohen MRN: [id number] DOB: 1946/02/21 Today's Date: 08/11/2020   History of Present Illness  74 y.o. F with PMH of tobacco use who presented with exertional shortness of breath, cough, diarrhea, fatigue since 8/1.  Work-up significant for small PE, hypercarbia, likely COPD exacerbation.  Bipap was initiated, however hypercarbia failed to significantly improve and pt became more lethargic, pt was intubated 9/5, extubated 9/9    Clinical Impression  Pt admitted with above diagnosis. PTA pt lived at home with her 2 sons, independent with mobility and ADLs. On eval, she required +2 max assist bed mobility, +2 mod assist sit to stand with SW, and +2 mod assist SPT with SW. Pt on 12L O2 with SpO2 in 90s. Pt will benefit from skilled PT to increase their independence and safety with mobility to allow discharge to the venue listed below.       Follow Up Recommendations CIR    Equipment Recommendations  Rolling walker with 5" wheels    Recommendations for Other Services Rehab consult     Precautions / Restrictions Precautions Precautions: Fall Restrictions Weight Bearing Restrictions: No      Mobility  Bed Mobility Overal bed mobility: Needs Assistance Bed Mobility: Supine to Sit     Supine to sit: Max assist;+2 for physical assistance;HOB elevated     General bed mobility comments: assist with BLE and trunk. Use of bed pad to scoot to EOB.  Transfers Overall transfer level: Needs assistance Equipment used: Standard walker Transfers: Sit to/from [employer] Transfers Sit to Stand: Mod assist;+2 physical assistance;Total assist Stand pivot transfers: Mod assist;+2 physical assistance       General transfer comment: On first standing attempt, pt only able to clear bottom from bed a couple inches. On second attempt, PT/OT used bed pad to lift hips with pt being able to stand with +2 mod assist. Pivot transfer toward  L with SW. Continual cues for sequencing and safety.  Ambulation/Gait             General Gait Details: unable  Stairs            Wheelchair Mobility    Modified Rankin (Stroke Patients Only)       Balance Overall balance assessment: Needs assistance Sitting-balance support: Bilateral upper extremity supported;Feet supported Sitting balance-Leahy Scale: Poor Sitting balance - Comments: reliant on BUE support   Standing balance support: Bilateral upper extremity supported Standing balance-Leahy Scale: Poor Standing balance comment: reliant on external support                             Pertinent Vitals/Pain Pain Assessment: No/denies pain    Home Living Family/patient expects to be discharged to:: Private residence Living Arrangements: Children Available Help at Discharge: Family;Available 24 hours/day Type of Home: House Home Access: Stairs to enter Entrance Stairs-Rails: Doctor, general practice of Steps: 4 Home Layout: One level Home Equipment: Environmental consultant - 2 wheels      Prior Function Level of Independence: Independent         Comments: including driving per pt     Hand Dominance   Dominant Hand: Right    Extremity/Trunk Assessment   Upper Extremity Assessment Upper Extremity Assessment: Generalized weakness    Lower Extremity Assessment Lower Extremity Assessment: Generalized weakness       Communication   Communication: No difficulties  Cognition Arousal/Alertness: Awake/alert Behavior During Therapy: Impulsive Overall Cognitive Status:  Impaired/Different from baseline                                 General Comments: Pt was totally oriented, but when conversing with her she would go off on tangents that did not make any sense at all and/or hard to understand      General Comments General comments (skin integrity, edema, etc.): Pt on 12L O2 via HFNC 40% FiO2 with sats stable in 90s.     Exercises     Assessment/Plan    PT Assessment Patient needs continued PT services  PT Problem List Decreased strength;Decreased mobility;Decreased activity tolerance;Cardiopulmonary status limiting activity;Decreased balance;Decreased knowledge of use of DME       PT Treatment Interventions DME instruction;Therapeutic activities;Gait training;Therapeutic exercise;Patient/family education;Balance training;Functional mobility training    PT Goals (Current goals can be found in the Care Plan section)  Acute Rehab PT Goals Patient Stated Goal: to go home PT Goal Formulation: With patient Time For Goal Achievement: 08/25/20 Potential to Achieve Goals: Good    Frequency Min 3X/week   Barriers to discharge        Co-evaluation PT/OT/SLP Co-Evaluation/Treatment: Yes Reason for Co-Treatment: For patient/therapist safety;Necessary to address cognition/behavior during functional activity;To address functional/ADL transfers PT goals addressed during session: Mobility/safety with mobility;Balance;Proper use of DME         AM-PAC PT "6 Clicks" Mobility  Outcome Measure Help needed turning from your back to your side while in a flat bed without using bedrails?: A Lot Help needed moving from lying on your back to sitting on the side of a flat bed without using bedrails?: A Lot Help needed moving to and from a bed to a chair (including a wheelchair)?: A Lot Help needed standing up from a chair using your arms (e.g., wheelchair or bedside chair)?: A Lot Help needed to walk in hospital room?: Total Help needed climbing 3-5 steps with a railing? : Total 6 Click Score: 10    End of Session Equipment Utilized During Treatment: Gait belt;Oxygen Activity Tolerance: Patient tolerated treatment well Patient left: in chair;with call bell/phone within reach;with chair alarm set Nurse Communication: Mobility status PT Visit Diagnosis: Other abnormalities of gait and mobility (R26.89);Difficulty  in walking, not elsewhere classified (R26.2)    Time: 0921-0950 PT Time Calculation (min) (ACUTE ONLY): 29 min   Charges:   PT Evaluation $PT Eval Moderate Complexity: 1 Mod          Aida Raider, PT  Office # [phone] Pager [phone]   Ilda Foil 08/11/2020, 12:06 PM

## 2020-08-11 NOTE — Evaluation (Signed)
Clinical/Bedside Swallow Evaluation Patient Details  Name: Miranda Cohen MRN: [id number] Date of Birth: 23-May-1946  Today's Date: 08/11/2020 Time: SLP Start Time (ACUTE ONLY): 1552 SLP Stop Time (ACUTE ONLY): 1615 SLP Time Calculation (min) (ACUTE ONLY): 23 min  Past Medical History: History reviewed. No pertinent past medical history. Past Surgical History: History reviewed. No pertinent surgical history. HPI:  Pt is a 74 y.o. female with PMH of tobacco use who presented with exertional shortness of breath, cough, diarrhea, fatigue since 8/1.  Work-up significant for small PE, hypercarbia, likely COPD exacerbation.  Bipap was initiated, however hypercarbia failed to significantly improve and pt became more lethargic, pt was intubated 9/5, extubated 9/9 and a clear liquid diet was initiated. CT chest 9/6: Small pleural effusions are noted with adjacent atelectasis or infiltrates of both lower lobes. CXR 9/9: Increased bibasilar interstitial densities are noted concerning for worsening pulmonary edema.   Assessment / Plan / Recommendation Clinical Impression  Pt was seen for bedside swallow evaluation and she denied a history of dysphagia but stated that since extubation "mucous just won't go down". Oral mechanism exam was [facility] and she presented with full dentures. Pt's vocal quality was [facility] and she denied any changes in voice. She tolerated all solids and liquids without signs or symptoms of oropharyngeal dysphagia. A regular texture diet with thin liquids is recommended at this time; however, considering her prolonged intubation, SLP will follow to ensure diet tolerance.  SLP Visit Diagnosis: Dysphagia, unspecified (R13.10)    Aspiration Risk  Mild aspiration risk    Diet Recommendation Regular;Thin liquid   Liquid Administration via: Cup;Straw Medication Administration: Whole meds with liquid Supervision: Patient able to self feed Compensations: Slow rate;Small sips/bites Postural  Changes: Seated upright at 90 degrees    Other  Recommendations Oral Care Recommendations: Oral care BID   Follow up Recommendations None      Frequency and Duration min 1 x/week  1 week       Prognosis        Swallow Study   General Date of Onset: 08/10/20 HPI: Pt is a 74 y.o. female with PMH of tobacco use who presented with exertional shortness of breath, cough, diarrhea, fatigue since 8/1.  Work-up significant for small PE, hypercarbia, likely COPD exacerbation.  Bipap was initiated, however hypercarbia failed to significantly improve and pt became more lethargic, pt was intubated 9/5, extubated 9/9 and a clear liquid diet was initiated. CT chest 9/6: Small pleural effusions are noted with adjacent atelectasis or infiltrates of both lower lobes. CXR 9/9: Increased bibasilar interstitial densities are noted concerning for worsening pulmonary edema. Type of Study: Bedside Swallow Evaluation Previous Swallow Assessment: None Diet Prior to this Study: Thin liquids (clear liquids) Temperature Spikes Noted: No Respiratory Status: Nasal cannula History of Recent Intubation: Yes Length of Intubations (days): 4 days Date extubated: 08/10/20 Behavior/Cognition: Alert;Cooperative;Pleasant mood Oral Cavity Assessment: Within Functional Limits Oral Care Completed by SLP: No Vision: Functional for self-feeding Self-Feeding Abilities: Able to feed self Patient Positioning: Upright in chair;Postural control adequate for testing Baseline Vocal Quality: Normal Volitional Cough: Strong Volitional Swallow: Able to elicit    Oral/Motor/Sensory Function Overall Oral Motor/Sensory Function: Within functional limits   Ice Chips Ice chips: Within functional limits Presentation: Spoon   Thin Liquid Thin Liquid: Within functional limits Presentation: Straw;Cup    Nectar Thick Nectar Thick Liquid: Not tested   Honey Thick Honey Thick Liquid: Not tested   Puree Puree: Within functional  limits Presentation: Spoon  Solid     Solid: Within functional limits Presentation: Spoon;Self Fed     Anniebelle Devore I. Vear Clock, MS, CCC-SLP Acute Rehabilitation Services Office number [phone] Pager [phone]  Scheryl Marten 08/11/2020,5:09 PM

## 2020-08-12 DIAGNOSIS — J9602 Acute respiratory failure with hypercapnia: Secondary | ICD-10-CM

## 2020-08-12 DIAGNOSIS — J9601 Acute respiratory failure with hypoxia: Secondary | ICD-10-CM

## 2020-08-12 LAB — BASIC METABOLIC PANEL
Anion gap: 12 (ref 5–15)
BUN: 54 mg/dL — ABNORMAL HIGH (ref 8–23)
CO2: 29 mmol/L (ref 22–32)
Calcium: 8.8 mg/dL — ABNORMAL LOW (ref 8.9–10.3)
Chloride: 100 mmol/L (ref 98–111)
Creatinine, Ser: 1.28 mg/dL — ABNORMAL HIGH (ref 0.44–1.00)
GFR calc Af Amer: 47 mL/min — ABNORMAL LOW (ref >60)
GFR calc non Af Amer: 41 mL/min — ABNORMAL LOW (ref >60)
Glucose, Bld: 87 mg/dL (ref 70–99)
Potassium: 3.5 mmol/L (ref 3.5–5.1)
Sodium: 141 mmol/L (ref 135–145)

## 2020-08-12 LAB — GLUCOSE, CAPILLARY
Glucose-Capillary: 110 mg/dL — ABNORMAL HIGH (ref 70–99)
Glucose-Capillary: 110 mg/dL — ABNORMAL HIGH (ref 70–99)
Glucose-Capillary: 111 mg/dL — ABNORMAL HIGH (ref 70–99)
Glucose-Capillary: 122 mg/dL — ABNORMAL HIGH (ref 70–99)
Glucose-Capillary: 163 mg/dL — ABNORMAL HIGH (ref 70–99)
Glucose-Capillary: 88 mg/dL (ref 70–99)

## 2020-08-12 LAB — HEPARIN LEVEL (UNFRACTIONATED)
Heparin Unfractionated: 0.32 IU/mL (ref 0.30–0.70)
Heparin Unfractionated: 0.68 IU/mL (ref 0.30–0.70)

## 2020-08-12 LAB — CBC
HCT: 52.6 % — ABNORMAL HIGH (ref 36.0–46.0)
Hemoglobin: 15.4 g/dL — ABNORMAL HIGH (ref 12.0–15.0)
MCH: 27.3 pg (ref 26.0–34.0)
MCHC: 29.3 g/dL — ABNORMAL LOW (ref 30.0–36.0)
MCV: 93.3 fL (ref 80.0–100.0)
Platelets: 100 10*3/uL — ABNORMAL LOW (ref 150–400)
RBC: 5.64 MIL/uL — ABNORMAL HIGH (ref 3.87–5.11)
RDW: 15.6 % — ABNORMAL HIGH (ref 11.5–15.5)
WBC: 6.1 10*3/uL (ref 4.0–10.5)
nRBC: 0 % (ref 0.0–0.2)

## 2020-08-12 NOTE — Progress Notes (Signed)
Physical Therapy Treatment Patient Details Name: Miranda Cohen MRN: [id number] DOB: 10-21-1946 Today's Date: 08/12/2020    History of Present Illness 74 y.o. F with PMH of tobacco use who presented with exertional shortness of breath, cough, diarrhea, fatigue since 8/1.  Work-up significant for small PE, hypercarbia, likely COPD exacerbation.  Bipap was initiated, however hypercarbia failed to significantly improve and pt became more lethargic, pt was intubated 9/5, extubated 9/9    PT Comments    Pt received in bed. Declining transfer to recliner (due to not sleeping well last night) but agreeable to LE exercises. After exercises, pt assisted with scooting up in bed. Pt on 6L O2 HFNC. SpO2 88-92% at rest. Desat to 85% during activity. Pt remained in bed at end of session with HOB at 50 degree angle.   Follow Up Recommendations  CIR     Equipment Recommendations  Rolling walker with 5" wheels    Recommendations for Other Services       Precautions / Restrictions Precautions Precautions: Fall    Mobility  Bed Mobility Overal bed mobility: Needs Assistance             General bed mobility comments: mod assist to scoot up in bed. Bed in trendelenburg position with pt able to pull with BUE on headboard and push with bilat feet (knees bent).  Transfers                    Ambulation/Gait                 Stairs             Wheelchair Mobility    Modified Rankin (Stroke Patients Only)       Balance                                            Cognition Arousal/Alertness: Awake/alert Behavior During Therapy: WFL for tasks assessed/performed Overall Cognitive Status: Impaired/Different from baseline Area of Impairment: Memory;Problem solving;Awareness                     Memory: Decreased short-term memory     Awareness: Emergent Problem Solving: Difficulty sequencing;Requires verbal cues General Comments:  Oriented but is demonstrating mild confusion. Pt reporting she felt she was moved to a new room yesterday because she asked too many questions and irritated the RN. In acuality, pt transfered out of ICU ([facility]M to [facility]).      Exercises General Exercises - Lower Extremity Ankle Circles/Pumps: AROM;Both;20 reps;Supine Quad Sets: AROM;Both;10 reps;Supine Gluteal Sets: AROM;Both;10 reps;Supine Heel Slides: AROM;Right;Left;10 reps;Supine Hip ABduction/ADduction: AROM;Both;10 reps;Supine    General Comments        Pertinent Vitals/Pain Pain Assessment: No/denies pain    Home Living                      Prior Function            PT Goals (current goals can now be found in the care plan section) Acute Rehab PT Goals Patient Stated Goal: to go home Progress towards PT goals: Progressing toward goals    Frequency    Min 3X/week      PT Plan Current plan remains appropriate    Co-evaluation              AM-PAC PT "6 Clicks"  Mobility   Outcome Measure  Help needed turning from your back to your side while in a flat bed without using bedrails?: A Lot Help needed moving from lying on your back to sitting on the side of a flat bed without using bedrails?: A Lot Help needed moving to and from a bed to a chair (including a wheelchair)?: A Lot Help needed standing up from a chair using your arms (e.g., wheelchair or bedside chair)?: A Lot Help needed to walk in hospital room?: Total Help needed climbing 3-5 steps with a railing? : Total 6 Click Score: 10    End of Session Equipment Utilized During Treatment: Oxygen Activity Tolerance: Patient tolerated treatment well Patient left: in bed;with call bell/phone within reach Nurse Communication: Mobility status PT Visit Diagnosis: Other abnormalities of gait and mobility (R26.89);Difficulty in walking, not elsewhere classified (R26.2)     Time: 6811-5726 PT Time Calculation (min) (ACUTE ONLY): 19 min  Charges:   $Therapeutic Exercise: 8-22 mins                     Aida Raider, PT  Office # [phone] Pager [phone]    Ilda Foil 08/12/2020, 12:10 PM

## 2020-08-12 NOTE — Progress Notes (Signed)
  Speech Language Pathology Treatment &  DISCHARGE NOTE: Dysphagia  Patient Details Name: Miranda Cohen MRN: [id number] DOB: 1946/04/30 Today's Date: 08/12/2020  Time: 3335-4562 SLP Time Calculation (min) (ACUTE ONLY): 15 min  Assessment / Plan / Recommendation Clinical Impression  ST follow up to address goals for therapeutic diet tolerance following clinical swallowing evaluation yesterday after a prolonged intubation.  RN reported no obvious issues with intake, however, that the patient's intake had been poor because she did not care for the taste of the food.  Patient reported no issues with intake and reported a dislike for the food being sent from the kitchen.  Meal observation was completed using thin liquids via straw, pureed material and dry solids.  Mastication of dry solids appeared adequate with no oral residue seen post swallow.  Swallow trigger appreciated.  Overt s/s of aspiration were not seen including during completion of a 3 oz water challenge (ie [facility] Swallowing Protocol).  Suggest she remain on regular textured diet with thin liquids.  ST follow up is no longer indicated.  If we can be of further assistance please feel free to re-consult.  Thank you.    HPI HPI: Pt is a 74 y.o. female with PMH of tobacco use who presented with exertional shortness of breath, cough, diarrhea, fatigue since 8/1.  Work-up significant for small PE, hypercarbia, likely COPD exacerbation.  Bipap was initiated, however hypercarbia failed to significantly improve and pt became more lethargic, pt was intubated 9/5, extubated 9/9 and a clear liquid diet was initiated. CT chest 9/6: Small pleural effusions are noted with adjacent atelectasis or infiltrates of both lower lobes. CXR 9/9: Increased bibasilar interstitial densities are noted concerning for worsening pulmonary edema.      SLP Plan  Discharge SLP treatment due to (comment) (plan of care met.)       Recommendations  Diet recommendations:  Regular;Thin liquid Liquids provided via: Straw;Cup Medication Administration: Whole meds with liquid Supervision: Patient able to self feed Postural Changes and/or Swallow Maneuvers: Seated upright 90 degrees;Upright 30-60 min after meal                Oral Care Recommendations: Oral care BID Follow up Recommendations: None SLP Visit Diagnosis: Dysphagia, unspecified (R13.10) Plan: Discharge SLP treatment due to (comment) (plan of care met.)       [place], MA, CCC-SLP Acute Rehab SLP [phone]  Lamar Sprinkles 08/12/2020, 9:00 AM

## 2020-08-12 NOTE — Progress Notes (Signed)
PROGRESS NOTE    Miranda Cohen  IRW:[id number] DOB: 1945-12-30 DOA: 08/05/2020 PCP: Patient, No Pcp Per      Brief Narrative: 74 years old female with past medical history of prolonged tobacco use, history of of myxedema coma, medical noncompliance who presented in the ED with progressive shortness of breath for a month.  Few days back she was in the ED but she left AGAINST MEDICAL ADVICE.  During this hospitalization she was found lethargic with her O2 sat 50% on room air. CT chest found to have a small PE, cardiomegaly and moderate pericardial effusion.  She was initially admitted under hospitalist service, but due to ongoing instability.  she was admitted in ICU.  She was intubated on 9/5  remained in ICU for few days.  Successfully extubated  9/9  , stable on high flow nasal cannula.  PCCM pickup 9/11.  .  Patient is admitted for acute hypoxic and hypercapnic respiratory failure secondary to COPD, PAH and acute PE. She was initially started on broad-spectrum antibiotics and then de-escalate to ceftriaxone. She completed antibiotics. Initially intubated and successfully extubated. She is on Haparin gtt. Echocardiogram shows large posterior pericardial effusion no evidence of tamponade.  Cardiology recommended continue treatment for hypothyroidism if effusion worsens or tamponade arises, need cardiothoracic surgery consult for pericardial window.  She is also being managed for myxedema coma with a history of chronic lymphocytic thyroiditis, continued on IV Synthroid.   Assessment & Plan:   Principal Problem:   Acute respiratory failure with hypoxia and hypercapnia (HCC) Active Problems:   Acute pulmonary embolism (HCC)   Pericardial effusion   COPD (chronic obstructive pulmonary disease) (HCC)   Renal insufficiency   Coronary artery calcification  Acute respiratory failure with hypoxia and hypercapnia: -Could be multifactorial due to COPD, undiagnosed PAH, myxedema, acute PE - She was  intubated on arrival 9/5: - She was successfully extubated on 9/9. - On 7L/ m O2 SAT 92%  -Acute PE : On heparin gtt, will transition to Eliquis. -Continue O2 to keep SpO2 >90% -pulmonary hygiene, OOB mobility as able. - SLP swallow eval : Thin liquids - Continue inhalers.  Pericardial effusion could be sec.to myxedema. Echo: shows large posterior pericardial effusion, no evidence of tamponade.   Cardiology recommended continue treatment for hypothyroidism if effusion worsens or tamponade arises need cardiothoracic surgery consult for pericardial window.   -avoid over-diuresis. -monitor for hypotension  Myxedema coma; h/o chronic lymphocytic thyroiditis -cont iv synthroid, will transition to po -cautious diuresis given peripheral edema.  Acute Kidney Injury >> Improving  -con't to monitor  con't strict I/Os   avoid nephrotoxic meds  Nausea, vomiting -phenergan & zofran PRN for nausea  HTN -hydralazine PRN. -avoid overly controlling given concern for hypervolemia and need for diuresis but pre-load dependence;  hypotension should indicate diuresis should stop.   Tobacco abuse: -nicotine patch   DVT prophylaxis: Heparin gtt Code Status: Full Family Communication: spoke with daughter on phone. Disposition Plan:  Status is: Inpatient  Remains inpatient appropriate because:Hemodynamically unstable   Dispo: The patient is from: SNF              Anticipated d/c is to: SNF              Anticipated d/c date is: 3 days              Patient currently is not medically stable to d/c.   Consultants:   Cardiology, PCCM  Procedures:  Antimicrobials: Anti-infectives (From admission, onward)  Start     Dose/Rate Route Frequency Ordered Stop   08/09/20 1000  cefTRIAXone (ROCEPHIN) 2 g in sodium chloride 0.9 % 100 mL IVPB        2 g 200 mL/hr over 30 Minutes Intravenous Every 24 hours 08/08/20 1133 08/10/20 1054   08/08/20 0500  vancomycin (VANCOCIN) IVPB 1000 mg/200  mL premix  Status:  Discontinued        1,000 mg 200 mL/hr over 60 Minutes Intravenous Every 48 hours 08/06/20 0436 08/06/20 0821   08/07/20 0600  ceFEPIme (MAXIPIME) 1 g in sodium chloride 0.9 % 100 mL IVPB  Status:  Discontinued        1 g 200 mL/hr over 30 Minutes Intravenous Every 24 hours 08/06/20 0436 08/06/20 0952   08/06/20 2100  ceFEPIme (MAXIPIME) 2 g in sodium chloride 0.9 % 100 mL IVPB  Status:  Discontinued        2 g 200 mL/hr over 30 Minutes Intravenous Every 12 hours 08/06/20 0952 08/08/20 1133   08/06/20 0530  vancomycin (VANCOREADY) IVPB 2000 mg/400 mL  Status:  Discontinued        2,000 mg 200 mL/hr over 120 Minutes Intravenous  Once 08/06/20 0430 08/06/20 0822   08/06/20 0530  ceFEPIme (MAXIPIME) 2 g in sodium chloride 0.9 % 100 mL IVPB        2 g 200 mL/hr over 30 Minutes Intravenous  Once 08/06/20 0430 08/06/20 0955     Subjective: Patient was seen and examined at bedside.  Overnight events noted.  Patient is very talkative, alert , oriented on 6 L supplemental oxygen saturating 91%.  She appears puffy and swollen.  Objective: Vitals:   08/11/20 1954 08/11/20 2058 08/12/20 0050 08/12/20 0400  BP: (!) 166/75  (!) 143/76 (!) 141/90  Pulse: 79  76 100  Resp: (!) 21  13 17   Temp: 97.8 F (36.6 C)  (!) 97.5 F (36.4 C) (!) 97.4 F (36.3 C)  TempSrc: Oral  Oral Oral  SpO2: 98% 98%  95%  Weight:    103 kg  Height:        Intake/Output Summary (Last 24 hours) at 08/12/2020 1536 Last data filed at 08/12/2020 1258 Gross per 24 hour  Intake 506.02 ml  Output 1600 ml  Net -1093.98 ml   Filed Weights   08/09/20 0500 08/11/20 0415 08/12/20 0400  Weight: 105 kg 107.6 kg 103 kg    Examination:  General exam: Appears calm and comfortable , Alert, oriented, puffy, swollen.  Respiratory system: Clear to auscultation. Respiratory effort normal. Cardiovascular system: S1 & S2 heard, RRR. No JVD, murmurs, rubs, gallops or clicks. No pedal edema. Gastrointestinal  system: Abdomen is nondistended, soft and nontender. No organomegaly or masses felt. Normal bowel sounds heard. Central nervous system: Alert and oriented. No focal neurological deficits. Extremities:  Leg edema +. Skin: No rashes, lesions or ulcers Psychiatry: Judgement and insight appear normal. Mood & affect appropriate.     Data Reviewed: I have personally reviewed following labs and imaging studies  CBC: Recent Labs  Lab 08/08/20 0441 08/08/20 0441 08/08/20 0853 08/09/20 0443 08/10/20 0644 08/11/20 0416 08/12/20 0708  WBC 10.2  --   --  6.7 5.1 7.7 6.1  HGB 15.4*   < > 17.0* 14.7 13.9 15.4* 15.4*  HCT 51.8*   < > 50.0* 49.8* 46.9* 50.9* 52.6*  MCV 92.5  --   --  92.2 92.5 92.7 93.3  PLT 171  --   --  153 122* 112* 100*   < > = values in this interval not displayed.   Basic Metabolic Panel: Recent Labs  Lab 08/08/20 0441 08/08/20 0441 08/08/20 0853 08/09/20 0443 08/10/20 0644 08/11/20 0416 08/12/20 0708  NA 138   < > 136 136 138 139 141  K 5.0   < > 4.5 4.4 4.3 4.0 3.5  CL 100  --   --  98 101 101 100  CO2 27  --   --  GLUCOSE 108*  --   --  134* 102* 103* 87  BUN 45*  --   --  54* 67* 56* 54*  CREATININE 1.92*  --   --  1.98* 1.60* 1.39* 1.28*  CALCIUM 8.6*  --   --  8.8* 8.5* 9.0 8.8*   < > = values in this interval not displayed.   GFR: Estimated Creatinine Clearance: 44.5 mL/min (A) (by C-G formula based on SCr of 1.28 mg/dL (H)). Liver Function Tests: Recent Labs  Lab 08/07/20 0934  AST 10*  ALT 12  ALKPHOS 51  BILITOT 1.3*  PROT 5.7*  ALBUMIN 2.6*   No results for input(s): LIPASE, AMYLASE in the last 168 hours. No results for input(s): AMMONIA in the last 168 hours. Coagulation Profile: Recent Labs  Lab 08/05/20 1700  INR 1.1   Cardiac Enzymes: Recent Labs  Lab 08/09/20 1152  CKTOTAL 61   BNP (last 3 results) No results for input(s): PROBNP in the last 8760 hours. HbA1C: No results for input(s): HGBA1C in the last 72  hours. CBG: Recent Labs  Lab 08/11/20 2007 08/12/20 0047 08/12/20 0411 08/12/20 0728 08/12/20 1118  GLUCAP 97 111* 110* 88 110*   Lipid Profile: Recent Labs    08/10/20 0644 08/11/20 0416  TRIG 95 146   Thyroid Function Tests: Recent Labs    08/10/20 0839  FREET4 0.57*  T3FREE 1.2*   Anemia Panel: No results for input(s): VITAMINB12, FOLATE, FERRITIN, TIBC, IRON, RETICCTPCT in the last 72 hours. Sepsis Labs: Recent Labs  Lab 08/05/20 1555  LATICACIDVEN 1.3    Recent Results (from the past 240 hour(s))  Urine culture     Status: None   Collection Time: 08/05/20  3:33 PM   Specimen: In/Out Cath Urine  Result Value Ref Range Status   Specimen Description IN/OUT CATH URINE  Final   Special Requests NONE  Final   Culture   Final    NO GROWTH Performed at [facility], [address], [place], [place] [address]    Report Status 08/07/2020 FINAL  Final  Blood Culture (routine x 2)     Status: None   Collection Time: 08/05/20  3:55 PM   Specimen: BLOOD  Result Value Ref Range Status   Specimen Description BLOOD RIGHT ANTECUBITAL  Final   Special Requests   Final    BOTTLES DRAWN AEROBIC AND ANAEROBIC Blood Culture results may not be optimal due to an inadequate volume of blood received in culture bottles   Culture   Final    NO GROWTH 5 DAYS Performed at [facility], [address] [place], [place] [address]    Report Status 08/10/2020 FINAL  Final  Blood Culture (routine x 2)     Status: None   Collection Time: 08/05/20  4:05 PM   Specimen: BLOOD RIGHT ARM  Result Value Ref Range Status   Specimen Description BLOOD RIGHT ARM  Final   Special Requests  Final    BOTTLES DRAWN AEROBIC AND ANAEROBIC Blood Culture results may not be optimal due to an inadequate volume of blood received in culture bottles   Culture   Final    NO GROWTH 5 DAYS Performed at [facility], [address], [place], [place] [address]    Report Status 08/10/2020  FINAL  Final  SARS Coronavirus 2 by RT PCR (hospital order, performed in [facility] hospital lab) Nasopharyngeal Nasopharyngeal Swab     Status: None   Collection Time: 08/05/20  4:16 PM   Specimen: Nasopharyngeal Swab  Result Value Ref Range Status   SARS Coronavirus 2 NEGATIVE NEGATIVE Final    Comment: (NOTE) SARS-CoV-2 target nucleic acids are NOT DETECTED.  The SARS-CoV-2 RNA is generally detectable in upper and lower respiratory specimens during the acute phase of infection. The lowest concentration of SARS-CoV-2 viral copies this assay can detect is 250 copies / mL. A negative result does not preclude SARS-CoV-2 infection and should not be used as the sole basis for treatment or other patient management decisions.  A negative result may occur with improper specimen collection / handling, submission of specimen other than nasopharyngeal swab, presence of viral mutation(s) within the areas targeted by this assay, and inadequate number of viral copies (<250 copies / mL). A negative result must be combined with clinical observations, patient history, and epidemiological information.  Fact Sheet for Patients:   BoilerBrush.com.cy  Fact Sheet for Healthcare Providers: https://pope.com/  This test is not yet approved or  cleared by the [place] FDA and has been authorized for detection and/or diagnosis of SARS-CoV-2 by FDA under an Emergency Use Authorization (EUA).  This EUA will remain in effect (meaning this test can be used) for the duration of the COVID-19 declaration under Section 564(b)(1) of the Act, 21 U.S.C. section 360bbb-3(b)(1), unless the authorization is terminated or revoked sooner.  Performed at [facility], [address] [place], [place] [address]   MRSA PCR Screening     Status: None   Collection Time: 08/06/20  3:09 AM   Specimen: Nasal Mucosa; Nasopharyngeal  Result Value Ref Range Status    MRSA by PCR NEGATIVE NEGATIVE Final    Comment:        The GeneXpert MRSA Assay (FDA approved for NASAL specimens only), is one component of a comprehensive MRSA colonization surveillance program. It is not intended to diagnose MRSA infection nor to guide or monitor treatment for MRSA infections. Performed at [facility], [address] [place], [place] [address]   Culture, respiratory (non-expectorated)     Status: None   Collection Time: 08/06/20  4:28 AM   Specimen: Tracheal Aspirate; Respiratory  Result Value Ref Range Status   Specimen Description TRACHEAL ASPIRATE  Final   Special Requests NONE  Final   Gram Stain   Final    FEW WBC PRESENT, PREDOMINANTLY PMN MODERATE GRAM POSITIVE COCCI IN CLUSTERS FEW GRAM NEGATIVE RODS    Culture   Final    RARE Normal respiratory flora-no Staph aureus or Pseudomonas seen Performed at [facility], [address] [place], [place] [address]    Report Status 08/09/2020 FINAL  Final  SARS Coronavirus 2 by RT PCR (hospital order, performed in [facility] hospital lab) Nasopharyngeal Nasopharyngeal Swab     Status: None   Collection Time: 08/06/20  4:39 AM   Specimen: Nasopharyngeal Swab  Result Value Ref Range Status   SARS Coronavirus 2  NEGATIVE NEGATIVE Final    Comment: (NOTE) SARS-CoV-2 target nucleic acids are NOT DETECTED.  The SARS-CoV-2 RNA is generally detectable in upper and lower respiratory specimens during the acute phase of infection. The lowest concentration of SARS-CoV-2 viral copies this assay can detect is 250 copies / mL. A negative result does not preclude SARS-CoV-2 infection and should not be used as the sole basis for treatment or other patient management decisions.  A negative result may occur with improper specimen collection / handling, submission of specimen other than nasopharyngeal swab, presence of viral mutation(s) within the areas targeted by this assay, and inadequate number of viral  copies (<250 copies / mL). A negative result must be combined with clinical observations, patient history, and epidemiological information.  Fact Sheet for Patients:   BoilerBrush.com.cy  Fact Sheet for Healthcare Providers: https://pope.com/  This test is not yet approved or  cleared by the [place] FDA and has been authorized for detection and/or diagnosis of SARS-CoV-2 by FDA under an Emergency Use Authorization (EUA).  This EUA will remain in effect (meaning this test can be used) for the duration of the COVID-19 declaration under Section 564(b)(1) of the Act, 21 U.S.C. section 360bbb-3(b)(1), unless the authorization is terminated or revoked sooner.  Performed at [facility], [address], [place], [place] [address]   Culture, blood (routine x 2)     Status: None   Collection Time: 08/06/20  6:37 AM   Specimen: BLOOD  Result Value Ref Range Status   Specimen Description BLOOD SITE NOT SPECIFIED  Final   Special Requests   Final    BOTTLES DRAWN AEROBIC AND ANAEROBIC Blood Culture results may not be optimal due to an inadequate volume of blood received in culture bottles   Culture   Final    NO GROWTH 5 DAYS Performed at [facility] Lab, [address], [place], [place] [address]    Report Status 08/11/2020 FINAL  Final    Radiology Studies: No results found.  Scheduled Meds: . Chlorhexidine Gluconate Cloth  6 each Topical Q0600  . feeding supplement  1 Container Oral BID BM  . hydrocortisone sod succinate (SOLU-CORTEF) inj  100 mg Intravenous BID  . insulin aspart  0-9 Units Subcutaneous Q4H  . ipratropium-albuterol  3 mL Nebulization BID  . levothyroxine  50 mcg Intravenous Daily  . nicotine  21 mg Transdermal Daily  . senna  2 tablet Oral BID AC & HS   Continuous Infusions: . sodium chloride Stopped (08/11/20 1337)  . heparin 1,150 Units/hr (08/12/20 0044)     LOS: 7 days    Time spent: 35  mins.    Cipriano Bunker, MD [facility]   If 7PM-7AM, please contact night-coverage

## 2020-08-12 NOTE — Progress Notes (Signed)
ANTICOAGULATION CONSULT NOTE - Follow Up Consult  Pharmacy Consult for Heparin Indication: pulmonary embolus  No Known Allergies  Patient Measurements: Height: 5\' 5"  (165.1 cm) Weight: 107.6 kg (237 lb 3.4 oz) IBW/kg (Calculated) : 57 Heparin Dosing Weight: 103kg  Vital Signs: Temp: 97.8 F (36.6 C) (09/10 1954) Temp Source: Oral (09/10 1954) BP: 166/75 (09/10 1954) Pulse Rate: 79 (09/10 1954)  Labs: Recent Labs    08/09/20 0443 08/09/20 0443 08/09/20 1152 08/10/20 0644 08/10/20 0644 08/11/20 0416 08/11/20 0416 08/11/20 0502 08/11/20 1412 08/11/20 2319  HGB 14.7   < >  --  13.9  --  15.4*  --   --   --   --   HCT 49.8*  --   --  46.9*  --  50.9*  --   --   --   --   PLT 153  --   --  122*  --  112*  --   --   --   --   HEPARINUNFRC 0.57   < >  --  0.53   < > 0.11*   < > 0.24* 0.55 0.32  CREATININE 1.98*  --   --  1.60*  --  1.39*  --   --   --   --   CKTOTAL  --   --  61  --   --   --   --   --   --   --    < > = values in this interval not displayed.    Estimated Creatinine Clearance: 42 mL/min (A) (by C-G formula based on SCr of 1.39 mg/dL (H)).   Assessment: 76 yof who presented to the ED with shortness of breath and found to have small pulmonary embolism.   Heparin level therapeutic (0.32) on gtt at 1100 units/hr. .   Goal of Therapy:  Heparin level 0.3-0.7 units/ml Monitor platelets by anticoagulation protocol: Yes   Plan:  Increase heparin to 1150 units/hr to keep in goal Daily CBC and HL  Monitor for s/sx of bleeding   73, PharmD Clinical Pharmacist **Pharmacist phone directory can now be found on [email or web address] ([email or web address]).  Listed under [facility].

## 2020-08-12 NOTE — Progress Notes (Signed)
ANTICOAGULATION CONSULT NOTE - Follow Up Consult  Pharmacy Consult for Heparin Indication: pulmonary embolus  No Known Allergies  Patient Measurements: Height: 5\' 5"  (165.1 cm) Weight: 103 kg (227 lb 1.2 oz) IBW/kg (Calculated) : 57 Heparin Dosing Weight: 103kg  Vital Signs: Temp: 97.4 F (36.3 C) (09/11 0400) Temp Source: Oral (09/11 0400) BP: 141/90 (09/11 0400) Pulse Rate: 100 (09/11 0400)  Labs: Recent Labs    08/09/20 1152 08/10/20 0644 08/10/20 0644 08/11/20 0416 08/11/20 0502 08/11/20 1412 08/11/20 2319 08/12/20 0708  HGB  --  13.9   < > 15.4*  --   --   --  15.4*  HCT  --  46.9*  --  50.9*  --   --   --  52.6*  PLT  --  122*  --  112*  --   --   --  100*  HEPARINUNFRC  --  0.53   < > 0.11*   < > 0.55 0.32 0.68  CREATININE  --  1.60*  --  1.39*  --   --   --   --   CKTOTAL 61  --   --   --   --   --   --   --    < > = values in this interval not displayed.    Estimated Creatinine Clearance: 41 mL/min (A) (by C-G formula based on SCr of 1.39 mg/dL (H)).   Assessment: 65 yof who presented to the ED with shortness of breath and found to have small pulmonary embolism.   Heparin level therapeutic (0.68) at higher end of goal on gtt at 1150 units/hr. Levels have been labile - rate was increased because the previous level had dropped.  Goal of Therapy:  Heparin level 0.3-0.7 units/ml Monitor platelets by anticoagulation protocol: Yes   Plan:  Continue heparin at 1150 units/hr Daily CBC and HL  Monitor for s/sx of bleeding  73, PharmD PGY1 Acute Care Pharmacy Resident Please refer to [facility] for unit-specific pharmacist

## 2020-08-12 NOTE — Procedures (Signed)
Patient declined Bipap for tonight.  

## 2020-08-13 LAB — COMPREHENSIVE METABOLIC PANEL
ALT: 33 U/L (ref 0–44)
AST: 21 U/L (ref 15–41)
Albumin: 2.6 g/dL — ABNORMAL LOW (ref 3.5–5.0)
Alkaline Phosphatase: 35 U/L — ABNORMAL LOW (ref 38–126)
Anion gap: 8 (ref 5–15)
BUN: 47 mg/dL — ABNORMAL HIGH (ref 8–23)
CO2: 32 mmol/L (ref 22–32)
Calcium: 8.9 mg/dL (ref 8.9–10.3)
Chloride: 100 mmol/L (ref 98–111)
Creatinine, Ser: 1.33 mg/dL — ABNORMAL HIGH (ref 0.44–1.00)
GFR calc Af Amer: 45 mL/min — ABNORMAL LOW (ref >60)
GFR calc non Af Amer: 39 mL/min — ABNORMAL LOW (ref >60)
Glucose, Bld: 115 mg/dL — ABNORMAL HIGH (ref 70–99)
Potassium: 4 mmol/L (ref 3.5–5.1)
Sodium: 140 mmol/L (ref 135–145)
Total Bilirubin: 1.4 mg/dL — ABNORMAL HIGH (ref 0.3–1.2)
Total Protein: 5.6 g/dL — ABNORMAL LOW (ref 6.5–8.1)

## 2020-08-13 LAB — GLUCOSE, CAPILLARY
Glucose-Capillary: 102 mg/dL — ABNORMAL HIGH (ref 70–99)
Glucose-Capillary: 123 mg/dL — ABNORMAL HIGH (ref 70–99)
Glucose-Capillary: 134 mg/dL — ABNORMAL HIGH (ref 70–99)
Glucose-Capillary: 143 mg/dL — ABNORMAL HIGH (ref 70–99)
Glucose-Capillary: 154 mg/dL — ABNORMAL HIGH (ref 70–99)
Glucose-Capillary: 95 mg/dL (ref 70–99)
Glucose-Capillary: 99 mg/dL (ref 70–99)

## 2020-08-13 LAB — CBC
HCT: 52.2 % — ABNORMAL HIGH (ref 36.0–46.0)
Hemoglobin: 15.1 g/dL — ABNORMAL HIGH (ref 12.0–15.0)
MCH: 27.1 pg (ref 26.0–34.0)
MCHC: 28.9 g/dL — ABNORMAL LOW (ref 30.0–36.0)
MCV: 93.7 fL (ref 80.0–100.0)
Platelets: 108 10*3/uL — ABNORMAL LOW (ref 150–400)
RBC: 5.57 MIL/uL — ABNORMAL HIGH (ref 3.87–5.11)
RDW: 15.3 % (ref 11.5–15.5)
WBC: 5.1 10*3/uL (ref 4.0–10.5)
nRBC: 0 % (ref 0.0–0.2)

## 2020-08-13 LAB — MAGNESIUM: Magnesium: 2.3 mg/dL (ref 1.7–2.4)

## 2020-08-13 LAB — T4, FREE: Free T4: 0.69 ng/dL (ref 0.61–1.12)

## 2020-08-13 LAB — TSH: TSH: 22.2 u[IU]/mL — ABNORMAL HIGH (ref 0.350–4.500)

## 2020-08-13 LAB — HEPARIN LEVEL (UNFRACTIONATED): Heparin Unfractionated: 0.6 IU/mL (ref 0.30–0.70)

## 2020-08-13 LAB — PHOSPHORUS: Phosphorus: 4 mg/dL (ref 2.5–4.6)

## 2020-08-13 NOTE — Progress Notes (Signed)
ANTICOAGULATION CONSULT NOTE - Follow Up Consult  Pharmacy Consult for Heparin Indication: pulmonary embolus  No Known Allergies  Patient Measurements: Height: 5\' 5"  (165.1 cm) Weight: 104 kg (229 lb 4.5 oz) IBW/kg (Calculated) : 57 Heparin Dosing Weight: 103kg  Vital Signs: Temp: 97.6 F (36.4 C) (09/12 0733) Temp Source: Oral (09/12 0733) BP: 122/57 (09/12 0400) Pulse Rate: 72 (09/12 0400)  Labs: Recent Labs    08/11/20 0416 08/11/20 0416 08/11/20 0502 08/11/20 2319 08/12/20 0708 08/13/20 0645  HGB 15.4*   < >  --   --  15.4* 15.1*  HCT 50.9*  --   --   --  52.6* 52.2*  PLT 112*  --   --   --  100* 108*  HEPARINUNFRC 0.11*  --    < > 0.32 0.68 0.60  CREATININE 1.39*  --   --   --  1.28* 1.33*   < > = values in this interval not displayed.    Estimated Creatinine Clearance: 43.1 mL/min (A) (by C-G formula based on SCr of 1.33 mg/dL (H)).   Assessment: 44 yof who presented to the ED with shortness of breath and found to have small pulmonary embolism.   Heparin level therapeutic (0.60) on gtt at 1150 units/hr. Hgb stable, PLT ~100. No s/sx bleeding charted.   Goal of Therapy:  Heparin level 0.3-0.7 units/ml Monitor platelets by anticoagulation protocol: Yes   Plan:  Continue heparin at 1150 units/hr Daily CBC and HL  Monitor for s/sx of bleeding F/u with transition to PO anticoagulation  73, PharmD PGY1 Acute Care Pharmacy Resident Please refer to [facility] for unit-specific pharmacist

## 2020-08-13 NOTE — Progress Notes (Signed)
PROGRESS NOTE    Loraine MapleJudith A Picklesimer  ZOX:[id number] DOB: 10/19/1946 DOA: 08/05/2020 PCP: Patient, No Pcp Per      Brief Narrative: 74 years old female with past medical history of prolonged tobacco use, history of of myxedema coma, medical noncompliance who presented in the ED with progressive shortness of breath for a month.  Few days back she was in the ED but she left AGAINST MEDICAL ADVICE.  During this hospitalization she was found lethargic with her O2 sat 50% on room air. CT chest found to have a small PE, cardiomegaly and moderate pericardial effusion.  She was initially admitted under hospitalist service, but due to ongoing instability.  she was admitted in ICU.  She was intubated on 9/5  remained in ICU for few days.  Successfully extubated  9/9  , stable on high flow nasal cannula.  PCCM pickup 9/11.  .  Patient is admitted for acute hypoxic and hypercapnic respiratory failure secondary to COPD, PAH and acute PE. She was initially started on broad-spectrum antibiotics and then de-escalate to ceftriaxone. She completed antibiotics. Initially intubated and successfully extubated. She is on Haparin gtt. Echocardiogram shows large posterior pericardial effusion no evidence of tamponade.  Cardiology recommended continue treatment for hypothyroidism if effusion worsens or tamponade arises, need cardiothoracic surgery consult for pericardial window.  She is also being managed for myxedema coma with a history of chronic lymphocytic thyroiditis, continued on IV Synthroid.   Assessment & Plan:   Principal Problem:   Acute respiratory failure with hypoxia and hypercapnia (HCC) Active Problems:   Acute pulmonary embolism (HCC)   Pericardial effusion   COPD (chronic obstructive pulmonary disease) (HCC)   Renal insufficiency   Coronary artery calcification  Acute respiratory failure with hypoxia and hypercapnia: - Could be multifactorial due to COPD, undiagnosed PAH, myxedema, acute PE - She was  intubated on arrival 9/5: - She was successfully extubated on 9/9. - On 7L/ m O2 SAT 92%  Increased to 8L/m overnight  -Acute PE : On heparin gtt, will transition to Eliquis. - Continue O2 to keep SpO2 >90% - pulmonary hygiene, OOB mobility as able. - SLP swallow eval : Thin liquids - Continue inhalers.  Pericardial effusion could be sec.to myxedema. Echo: shows large posterior pericardial effusion, no evidence of tamponade.   Cardiology recommended continue treatment for hypothyroidism if effusion worsens or tamponade arises need cardiothoracic surgery consult for pericardial window.   -avoid over-diuresis. -monitor for hypotension.  Myxedema coma; h/o chronic lymphocytic thyroiditis - TSH 63, T4 0.4 on arrival.  -cont iv synthroid, will transition to po synthroid tomorrow. -cautious diuresis given peripheral edema.  Lab Results  Component Value Date   TSH 22.200 (H) 08/13/2020   T3TOTAL 47 (L) 08/08/2020    Acute Kidney Injury >> Improving  -con't to monitor  con't strict I/Os   avoid nephrotoxic meds  Nausea, vomiting  -phenergan & zofran PRN for nausea  HTN -hydralazine PRN. -avoid overly controlling given concern for hypervolemia and need for diuresis but pre-load dependence;  hypotension should indicate diuresis should stop.   Tobacco abuse: -nicotine patch   DVT prophylaxis: Heparin gtt Code Status: Full Family Communication: spoke with daughter on phone. Disposition Plan:  Status is: Inpatient  Remains inpatient appropriate because:Hemodynamically unstable   Dispo: The patient is from: SNF              Anticipated d/c is to: SNF              Anticipated d/c  date is: 3 days              Patient currently is not medically stable to d/c.   Consultants:   Cardiology, PCCM  Procedures:  Antimicrobials: Anti-infectives (From admission, onward)   Start     Dose/Rate Route Frequency Ordered Stop   08/09/20 1000  cefTRIAXone (ROCEPHIN) 2 g in  sodium chloride 0.9 % 100 mL IVPB        2 g 200 mL/hr over 30 Minutes Intravenous Every 24 hours 08/08/20 1133 08/10/20 1054   08/08/20 0500  vancomycin (VANCOCIN) IVPB 1000 mg/200 mL premix  Status:  Discontinued        1,000 mg 200 mL/hr over 60 Minutes Intravenous Every 48 hours 08/06/20 0436 08/06/20 0821   08/07/20 0600  ceFEPIme (MAXIPIME) 1 g in sodium chloride 0.9 % 100 mL IVPB  Status:  Discontinued        1 g 200 mL/hr over 30 Minutes Intravenous Every 24 hours 08/06/20 0436 08/06/20 0952   08/06/20 2100  ceFEPIme (MAXIPIME) 2 g in sodium chloride 0.9 % 100 mL IVPB  Status:  Discontinued        2 g 200 mL/hr over 30 Minutes Intravenous Every 12 hours 08/06/20 0952 08/08/20 1133   08/06/20 0530  vancomycin (VANCOREADY) IVPB 2000 mg/400 mL  Status:  Discontinued        2,000 mg 200 mL/hr over 120 Minutes Intravenous  Once 08/06/20 0430 08/06/20 0822   08/06/20 0530  ceFEPIme (MAXIPIME) 2 g in sodium chloride 0.9 % 100 mL IVPB        2 g 200 mL/hr over 30 Minutes Intravenous  Once 08/06/20 0430 08/06/20 0955     Subjective: Patient was seen and examined at bedside.  Overnight events noted.  Patient reports feeling better,  she is on 8 L supplemental oxygen saturating 92%. Patient is very talkative, swelling has improved, denies any chest pain or shortness of breath.  Objective: Vitals:   08/13/20 0400 08/13/20 0733 08/13/20 0834 08/13/20 1137  BP: (!) 122/57   (!) 118/58  Pulse: 72     Resp: 12     Temp: 98.5 F (36.9 C) 97.6 F (36.4 C)  97.9 F (36.6 C)  TempSrc: Oral Oral  Oral  SpO2: 93%  91%   Weight:      Height:        Intake/Output Summary (Last 24 hours) at 08/13/2020 1440 Last data filed at 08/13/2020 0740 Gross per 24 hour  Intake 320 ml  Output 501 ml  Net -181 ml   Filed Weights   08/11/20 0415 08/12/20 0400 08/13/20 0039  Weight: 107.6 kg 103 kg 104 kg    Examination:  General exam: Appears comfortable , Alert, oriented, puffy, swollen.    Respiratory system: Clear to auscultation. Respiratory effort normal. Cardiovascular system: S1 & S2 heard, RRR. No JVD, murmurs, rubs, gallops or clicks. No pedal edema. Gastrointestinal system: Abdomen is nondistended, soft and nontender. No organomegaly or masses felt.  Normal bowel sounds heard. Central nervous system: Alert and oriented. No focal neurological deficits. Extremities:  Bilateral  Leg edema ++. Skin: No rashes, lesions or ulcers Psychiatry: Judgement and insight appear normal. Mood & affect appropriate.     Data Reviewed: I have personally reviewed following labs and imaging studies  CBC: Recent Labs  Lab 08/09/20 0443 08/10/20 0644 08/11/20 0416 08/12/20 0708 08/13/20 0645  WBC 6.7 5.1 7.7 6.1 5.1  HGB 14.7 13.9 15.4* 15.4* 15.1*  HCT 49.8* 46.9* 50.9* 52.6* 52.2*  MCV 92.2 92.5 92.7 93.3 93.7  PLT 153 122* 112* 100* 108*   Basic Metabolic Panel: Recent Labs  Lab 08/09/20 0443 08/10/20 0644 08/11/20 0416 08/12/20 0708 08/13/20 0645  NA 136 138 139 141 140  K 4.4 4.3 4.0 3.5 4.0  CL 98 101 101 100 100  CO2 26 27 28 29  32  GLUCOSE 134* 102* 103* 87 115*  BUN 54* 67* 56* 54* 47*  CREATININE 1.98* 1.60* 1.39* 1.28* 1.33*  CALCIUM 8.8* 8.5* 9.0 8.8* 8.9  MG  --   --   --   --  2.3  PHOS  --   --   --   --  4.0   GFR: Estimated Creatinine Clearance: 43.1 mL/min (A) (by C-G formula based on SCr of 1.33 mg/dL (H)). Liver Function Tests: Recent Labs  Lab 08/07/20 0934 08/13/20 0645  AST 10* 21  ALT 12 33  ALKPHOS 51 35*  BILITOT 1.3* 1.4*  PROT 5.7* 5.6*  ALBUMIN 2.6* 2.6*   No results for input(s): LIPASE, AMYLASE in the last 168 hours. No results for input(s): AMMONIA in the last 168 hours. Coagulation Profile: No results for input(s): INR, PROTIME in the last 168 hours. Cardiac Enzymes: Recent Labs  Lab 08/09/20 1152  CKTOTAL 61   BNP (last 3 results) No results for input(s): PROBNP in the last 8760 hours. HbA1C: No results for  input(s): HGBA1C in the last 72 hours. CBG: Recent Labs  Lab 08/12/20 1959 08/13/20 0038 08/13/20 0400 08/13/20 0735 08/13/20 1141  GLUCAP 163* 102* 123* 143* 99   Lipid Profile: Recent Labs    08/11/20 0416  TRIG 146   Thyroid Function Tests: Recent Labs    08/13/20 1056  TSH 22.200*  FREET4 0.69   Anemia Panel: No results for input(s): VITAMINB12, FOLATE, FERRITIN, TIBC, IRON, RETICCTPCT in the last 72 hours. Sepsis Labs: No results for input(s): PROCALCITON, LATICACIDVEN in the last 168 hours.  Recent Results (from the past 240 hour(s))  Urine culture     Status: None   Collection Time: 08/05/20  3:33 PM   Specimen: In/Out Cath Urine  Result Value Ref Range Status   Specimen Description IN/OUT CATH URINE  Final   Special Requests NONE  Final   Culture   Final    NO GROWTH Performed at [facility] Lab, [address] [place], [place]    Report Status 08/07/2020 FINAL  Final  Blood Culture (routine x 2)     Status: None   Collection Time: 08/05/20  3:55 PM   Specimen: BLOOD  Result Value Ref Range Status   Specimen Description BLOOD RIGHT ANTECUBITAL  Final   Special Requests   Final    BOTTLES DRAWN AEROBIC AND ANAEROBIC Blood Culture results may not be optimal due to an inadequate volume of blood received in culture bottles   Culture   Final    NO GROWTH 5 DAYS Performed at [facility] Lab, [address], [place], [place]    Report Status 08/10/2020 FINAL  Final  Blood Culture (routine x 2)     Status: None   Collection Time: 08/05/20  4:05 PM   Specimen: BLOOD RIGHT ARM  Result Value Ref Range Status   Specimen Description BLOOD RIGHT ARM  Final   Special Requests   Final    BOTTLES DRAWN AEROBIC AND ANAEROBIC Blood Culture results may not be optimal due to an inadequate  volume of blood received in culture bottles   Culture   Final    NO GROWTH 5 DAYS Performed at [facility] Lab, [address], [place], [place]  [address]    Report Status 08/10/2020 FINAL  Final  SARS Coronavirus 2 by RT PCR (hospital order, performed in [facility] hospital lab) Nasopharyngeal Nasopharyngeal Swab     Status: None   Collection Time: 08/05/20  4:16 PM   Specimen: Nasopharyngeal Swab  Result Value Ref Range Status   SARS Coronavirus 2 NEGATIVE NEGATIVE Final    Comment: (NOTE) SARS-CoV-2 target nucleic acids are NOT DETECTED.  The SARS-CoV-2 RNA is generally detectable in upper and lower respiratory specimens during the acute phase of infection. The lowest concentration of SARS-CoV-2 viral copies this assay can detect is 250 copies / mL. A negative result does not preclude SARS-CoV-2 infection and should not be used as the sole basis for treatment or other patient management decisions.  A negative result may occur with improper specimen collection / handling, submission of specimen other than nasopharyngeal swab, presence of viral mutation(s) within the areas targeted by this assay, and inadequate number of viral copies (<250 copies / mL). A negative result must be combined with clinical observations, patient history, and epidemiological information.  Fact Sheet for Patients:   BoilerBrush.com.cy  Fact Sheet for Healthcare Providers: https://pope.com/  This test is not yet approved or  cleared by the [place] FDA and has been authorized for detection and/or diagnosis of SARS-CoV-2 by FDA under an Emergency Use Authorization (EUA).  This EUA will remain in effect (meaning this test can be used) for the duration of the COVID-19 declaration under Section 564(b)(1) of the Act, 21 U.S.C. section 360bbb-3(b)(1), unless the authorization is terminated or revoked sooner.  Performed at [facility] Lab, [address], [place], [place] [address]   MRSA PCR Screening     Status: None   Collection Time: 08/06/20  3:09 AM   Specimen: Nasal Mucosa; Nasopharyngeal    Result Value Ref Range Status   MRSA by PCR NEGATIVE NEGATIVE Final    Comment:        The GeneXpert MRSA Assay (FDA approved for NASAL specimens only), is one component of a comprehensive MRSA colonization surveillance program. It is not intended to diagnose MRSA infection nor to guide or monitor treatment for MRSA infections. Performed at [facility] Lab, [address] [place], [place] [address]   Culture, respiratory (non-expectorated)     Status: None   Collection Time: 08/06/20  4:28 AM   Specimen: Tracheal Aspirate; Respiratory  Result Value Ref Range Status   Specimen Description TRACHEAL ASPIRATE  Final   Special Requests NONE  Final   Gram Stain   Final    FEW WBC PRESENT, PREDOMINANTLY PMN MODERATE GRAM POSITIVE COCCI IN CLUSTERS FEW GRAM NEGATIVE RODS    Culture   Final    RARE Normal respiratory flora-no Staph aureus or Pseudomonas seen Performed at [facility] Lab, [address], [place], [place] [address]    Report Status 08/09/2020 FINAL  Final  SARS Coronavirus 2 by RT PCR (hospital order, performed in [facility] hospital lab) Nasopharyngeal Nasopharyngeal Swab     Status: None   Collection Time: 08/06/20  4:39 AM   Specimen: Nasopharyngeal Swab  Result Value Ref Range Status   SARS Coronavirus 2 NEGATIVE NEGATIVE Final    Comment: (NOTE) SARS-CoV-2 target nucleic acids are NOT DETECTED.  The SARS-CoV-2 RNA  is generally detectable in upper and lower respiratory specimens during the acute phase of infection. The lowest concentration of SARS-CoV-2 viral copies this assay can detect is 250 copies / mL. A negative result does not preclude SARS-CoV-2 infection and should not be used as the sole basis for treatment or other patient management decisions.  A negative result may occur with improper specimen collection / handling, submission of specimen other than nasopharyngeal swab, presence of viral mutation(s) within the areas targeted by this  assay, and inadequate number of viral copies (<250 copies / mL). A negative result must be combined with clinical observations, patient history, and epidemiological information.  Fact Sheet for Patients:   BoilerBrush.com.cy  Fact Sheet for Healthcare Providers: https://pope.com/  This test is not yet approved or  cleared by the [place] FDA and has been authorized for detection and/or diagnosis of SARS-CoV-2 by FDA under an Emergency Use Authorization (EUA).  This EUA will remain in effect (meaning this test can be used) for the duration of the COVID-19 declaration under Section 564(b)(1) of the Act, 21 U.S.C. section 360bbb-3(b)(1), unless the authorization is terminated or revoked sooner.  Performed at [facility] Lab, [address], [place], [place] [address]   Culture, blood (routine x 2)     Status: None   Collection Time: 08/06/20  6:37 AM   Specimen: BLOOD  Result Value Ref Range Status   Specimen Description BLOOD SITE NOT SPECIFIED  Final   Special Requests   Final    BOTTLES DRAWN AEROBIC AND ANAEROBIC Blood Culture results may not be optimal due to an inadequate volume of blood received in culture bottles   Culture   Final    NO GROWTH 5 DAYS Performed at [facility] Lab, [address] [place], [place] [address]    Report Status 08/11/2020 FINAL  Final    Radiology Studies: No results found.  Scheduled Meds: . Chlorhexidine Gluconate Cloth  6 each Topical Q0600  . feeding supplement  1 Container Oral BID BM  . hydrocortisone sod succinate (SOLU-CORTEF) inj  100 mg Intravenous BID  . insulin aspart  0-9 Units Subcutaneous Q4H  . ipratropium-albuterol  3 mL Nebulization BID  . levothyroxine  50 mcg Intravenous Daily  . nicotine  21 mg Transdermal Daily  . senna  2 tablet Oral BID AC & HS   Continuous Infusions: . sodium chloride Stopped (08/11/20 1337)  . heparin 1,150 Units/hr (08/12/20 1949)      LOS: 8 days    Time spent: 25 mins.    Cipriano Bunker, MD [facility]   If 7PM-7AM, please contact night-coverage

## 2020-08-14 LAB — GLUCOSE, CAPILLARY
Glucose-Capillary: 133 mg/dL — ABNORMAL HIGH (ref 70–99)
Glucose-Capillary: 114 mg/dL — ABNORMAL HIGH (ref 70–99)
Glucose-Capillary: 97 mg/dL (ref 70–99)
Glucose-Capillary: 128 mg/dL — ABNORMAL HIGH (ref 70–99)
Glucose-Capillary: 199 mg/dL — ABNORMAL HIGH (ref 70–99)
Glucose-Capillary: 99 mg/dL (ref 70–99)

## 2020-08-14 LAB — MAGNESIUM: Magnesium: 2.5 mg/dL — ABNORMAL HIGH (ref 1.7–2.4)

## 2020-08-14 LAB — BASIC METABOLIC PANEL
Anion gap: 7 (ref 5–15)
BUN: 41 mg/dL — ABNORMAL HIGH (ref 8–23)
CO2: 35 mmol/L — ABNORMAL HIGH (ref 22–32)
Calcium: 9 mg/dL (ref 8.9–10.3)
Chloride: 98 mmol/L (ref 98–111)
Creatinine, Ser: 1.2 mg/dL — ABNORMAL HIGH (ref 0.44–1.00)
GFR calc Af Amer: 51 mL/min — ABNORMAL LOW (ref >60)
GFR calc non Af Amer: 44 mL/min — ABNORMAL LOW (ref >60)
Glucose, Bld: 91 mg/dL (ref 70–99)
Potassium: 3.8 mmol/L (ref 3.5–5.1)
Sodium: 140 mmol/L (ref 135–145)

## 2020-08-14 LAB — CBC
HCT: 54.1 % — ABNORMAL HIGH (ref 36.0–46.0)
Hemoglobin: 15.9 g/dL — ABNORMAL HIGH (ref 12.0–15.0)
MCH: 27.6 pg (ref 26.0–34.0)
MCHC: 29.4 g/dL — ABNORMAL LOW (ref 30.0–36.0)
MCV: 93.9 fL (ref 80.0–100.0)
Platelets: 154 10*3/uL (ref 150–400)
RBC: 5.76 MIL/uL — ABNORMAL HIGH (ref 3.87–5.11)
RDW: 15.5 % (ref 11.5–15.5)
WBC: 6.4 10*3/uL (ref 4.0–10.5)
nRBC: 0 % (ref 0.0–0.2)

## 2020-08-14 LAB — PHOSPHORUS: Phosphorus: 2.9 mg/dL (ref 2.5–4.6)

## 2020-08-14 MED ORDER — LEVOTHYROXINE SODIUM 100 MCG PO TABS
100.0000 ug | ORAL_TABLET | Freq: Every day | ORAL | Status: DC
  Administered 2020-08-15 – 2020-08-18 (×4): 100 ug via ORAL
  Filled 2020-08-14 (×4): qty 1

## 2020-08-14 MED ORDER — APIXABAN 5 MG PO TABS
10.0000 mg | ORAL_TABLET | Freq: Two times a day (BID) | ORAL | Status: DC
  Administered 2020-08-14 – 2020-08-18 (×9): 10 mg via ORAL
  Filled 2020-08-14 (×9): qty 2

## 2020-08-14 MED ORDER — ADULT MULTIVITAMIN W/MINERALS CH
1.0000 | ORAL_TABLET | Freq: Every day | ORAL | Status: DC
  Administered 2020-08-14 – 2020-08-18 (×5): 1 via ORAL
  Filled 2020-08-14 (×5): qty 1

## 2020-08-14 MED ORDER — APIXABAN 5 MG PO TABS: 5.0000 mg | ORAL_TABLET | Freq: Two times a day (BID) | ORAL | Status: DC

## 2020-08-14 NOTE — Progress Notes (Signed)
Nutrition Follow-up  RD working remotely.  DOCUMENTATION CODES:   Not applicable  INTERVENTION:   -D/c Boost Breeze po BID, each supplement provides 250 kcal and 9 grams of protein -Continue MVI with minerals daily -Magic cup TID with meals, each supplement provides 290 kcal and 9 grams of protein  NUTRITION DIAGNOSIS:   Inadequate oral intake related to vomiting as evidenced by per patient/family report.  Progressing; advanced to Heart Healthy diet on 08/11/20  GOAL:   Patient will meet greater than or equal to 90% of their needs  Progressing   MONITOR:   PO intake, Supplement acceptance, Labs, Weight trends, Skin, I & O's  REASON FOR ASSESSMENT:   Consult Assessment of nutrition requirement/status  ASSESSMENT:   74 yo female admitted with SOB, required intubation shortly after admission. Work-up revealed small PE, suspected COPD exacerbation. PMH includes tobacco abuse.  9/9- extubated 9/10- s/p BSE- advanced to regular diet with thin liquids  Reviewed I/O's: +810 ml x 24 hours and +3.2 L since admission  UOP: 875 ml x 24 hours  Attempted to speak with pt via call to hospital room phone, however, unable to reach.   Pt now on a heart healthy diet; noted meal completion 75-100%. She is refusing Boost Breeze supplements. No further instances of vomiting noted.   Per chart review, plan SNF placement once pt is medically stable for discharge.  Medications reviewed and include IV solu-medrol and senna.   Labs reviewed: CBGS:97-114 (inpatient orders for glycemic control are 0-9 units insulin aspart every 4 hours).   Diet Order:   Diet Order            Diet Heart Room service appropriate? Yes; Fluid consistency: Thin  Diet effective now                 EDUCATION NEEDS:   Not appropriate for education at this time  Skin:  Skin Assessment: Reviewed RN Assessment (MASD to hip & abdomen)  Last BM:  08/13/20  Height:   Ht Readings from Last 1 Encounters:   08/05/20 5\' 5"  (1.651 m)    Weight:   Wt Readings from Last 1 Encounters:  08/14/20 104.6 kg    Ideal Body Weight:  56.8 kg  BMI:  Body mass index is 38.37 kg/m.  Estimated Nutritional Needs:   Kcal:  1700-1900  Protein:  100-110 gm  Fluid:  >/= 1.7 L    08/16/20, RD, LDN, CDCES Registered Dietitian II Certified Diabetes Care and Education Specialist Please refer to [facility] for RD and/or RD on-call/weekend/after hours pager

## 2020-08-14 NOTE — Progress Notes (Signed)
PROGRESS NOTE    Miranda Cohen  KDT:[id number] DOB: 01/17/1946 DOA: 08/05/2020 PCP: Patient, No Pcp Per      Brief Narrative: 74 years old female with past medical history of prolonged tobacco use, history of of myxedema coma, medical noncompliance who presented in the ED with progressive shortness of breath for a month.  Few days back she was in the ED but she left AGAINST MEDICAL ADVICE.  During this hospitalization she was found lethargic with her O2 sat 50% on room air. CT chest found to have a small PE, cardiomegaly and moderate pericardial effusion.  She was initially admitted under hospitalist service, but due to ongoing instability.  she was admitted in ICU.  She was intubated on 9/5  remained in ICU for few days. Successfully extubated  9/9  , stable on high flow nasal cannula.  PCCM pickup 9/11.  .  Patient is admitted for acute hypoxic and hypercapnic respiratory failure secondary to COPD, PAH and acute PE. She was initially started on broad-spectrum antibiotics and then de-escalate to ceftriaxone. She completed antibiotics. Initially intubated and successfully extubated. She is on Haparin gtt transitioned to Eliquis 9/13 . Echocardiogram shows large posterior pericardial effusion no evidence of tamponade.  Cardiology recommended continue treatment for hypothyroidism if effusion worsens or tamponade arises, need cardiothoracic surgery consult for pericardial window.  She is also being managed for myxedema coma with a history of chronic lymphocytic thyroiditis, continued on IV Synthroid, transitioned to PO synthroid.   Assessment & Plan:   Principal Problem:   Acute respiratory failure with hypoxia and hypercapnia (HCC) Active Problems:   Acute pulmonary embolism (HCC)   Pericardial effusion   COPD (chronic obstructive pulmonary disease) (HCC)   Renal insufficiency   Coronary artery calcification  Acute respiratory failure with hypoxia and hypercapnia: - Could be multifactorial  due to COPD, undiagnosed PAH, myxedema, acute PE - She was intubated on arrival 9/5: - She was successfully extubated on 9/9. - On 8L/ m O2 SAT 92%  Increased to 8L/m overnight  -Acute PE : On heparin gtt,  transitioned to Eliquis 9/13. - Continue O2 to keep SpO2 >90% - pulmonary hygiene, OOB mobility as able. - SLP swallow eval : Thin liquids - Continue inhalers.  Pericardial effusion could be sec.to myxedema. Echo: shows large posterior pericardial effusion, no evidence of tamponade.   Cardiology recommended continue treatment for hypothyroidism if effusion worsens or tamponade arises need cardiothoracic surgery consult for pericardial window.   -avoid over-diuresis. -monitor for hypotension.  Myxedema coma; h/o chronic lymphocytic thyroiditis - TSH 63, T4 0.4 on arrival.  -cont iv synthroid, transitioned to po synthroid today. -Recheck TSH, FT3, FT4 in one week. -cautious diuresis given peripheral edema.  Lab Results  Component Value Date   TSH 22.200 (H) 08/13/2020   T3TOTAL 47 (L) 08/08/2020    Acute Kidney Injury >> Improving  -con't to monitor  con't strict I/Os   avoid nephrotoxic meds  Nausea, vomiting  -phenergan & zofran PRN for nausea  HTN -hydralazine PRN. -avoid overly controlling given concern for hypervolemia and need for diuresis but pre-load dependence;  hypotension should indicate diuresis should stop.   Tobacco abuse: -nicotine patch   DVT prophylaxis: Eliquis Code Status: Full Family Communication: spoke with daughter on phone. Disposition Plan:  Status is: Inpatient  Remains inpatient appropriate because:Hemodynamically unstable   Dispo: The patient is from: SNF              Anticipated d/c is to: SNF  Anticipated d/c date is: 76 days              Patient currently is not medically stable to d/c.   Consultants:   Cardiology, PCCM  Procedures:  Antimicrobials: Anti-infectives (From admission, onward)   Start      Dose/Rate Route Frequency Ordered Stop   08/09/20 1000  cefTRIAXone (ROCEPHIN) 2 g in sodium chloride 0.9 % 100 mL IVPB        2 g 200 mL/hr over 30 Minutes Intravenous Every 24 hours 08/08/20 1133 08/10/20 1054   08/08/20 0500  vancomycin (VANCOCIN) IVPB 1000 mg/200 mL premix  Status:  Discontinued        1,000 mg 200 mL/hr over 60 Minutes Intravenous Every 48 hours 08/06/20 0436 08/06/20 0821   08/07/20 0600  ceFEPIme (MAXIPIME) 1 g in sodium chloride 0.9 % 100 mL IVPB  Status:  Discontinued        1 g 200 mL/hr over 30 Minutes Intravenous Every 24 hours 08/06/20 0436 08/06/20 0952   08/06/20 2100  ceFEPIme (MAXIPIME) 2 g in sodium chloride 0.9 % 100 mL IVPB  Status:  Discontinued        2 g 200 mL/hr over 30 Minutes Intravenous Every 12 hours 08/06/20 0952 08/08/20 1133   08/06/20 0530  vancomycin (VANCOREADY) IVPB 2000 mg/400 mL  Status:  Discontinued        2,000 mg 200 mL/hr over 120 Minutes Intravenous  Once 08/06/20 0430 08/06/20 0822   08/06/20 0530  ceFEPIme (MAXIPIME) 2 g in sodium chloride 0.9 % 100 mL IVPB        2 g 200 mL/hr over 30 Minutes Intravenous  Once 08/06/20 0430 08/06/20 0955     Subjective: Patient was seen and examined at bedside.  Overnight events noted.  Patient reports feeling better,  Still requiring 8L O2 to maintain sat above 90%. Patient reports swelling has improved, denies any chest pain or shortness of breath.  Objective: Vitals:   08/14/20 0400 08/14/20 0543 08/14/20 0850 08/14/20 1100  BP: (!) 124/58     Pulse: 88     Resp: 14     Temp: 98.5 F (36.9 C)   98.1 F (36.7 C)  TempSrc: Oral     SpO2: 90%  94%   Weight:  104.6 kg    Height:        Intake/Output Summary (Last 24 hours) at 08/14/2020 1431 Last data filed at 08/14/2020 1311 Gross per 24 hour  Intake 2098.48 ml  Output 600 ml  Net 1498.48 ml   Filed Weights   08/12/20 0400 08/13/20 0039 08/14/20 0543  Weight: 103 kg 104 kg 104.6 kg    Examination:  General exam:  Appears comfortable , Alert, oriented, puffy, swollen.  Respiratory system: Clear to auscultation. Respiratory effort normal. Cardiovascular system: S1 & S2 heard, RRR. No JVD, murmurs, rubs, gallops or clicks. No pedal edema. Gastrointestinal system: Abdomen is nondistended, soft and nontender. No organomegaly or masses felt.  Normal bowel sounds heard. Central nervous system: Alert and oriented. No focal neurological deficits. Extremities:  Bilateral  Leg edema ++. Skin: No rashes, lesions or ulcers Psychiatry: Judgement and insight appear normal. Mood & affect appropriate.     Data Reviewed: I have personally reviewed following labs and imaging studies  CBC: Recent Labs  Lab 08/10/20 0644 08/11/20 0416 08/12/20 0708 08/13/20 0645 08/14/20 1020  WBC 5.1 7.7 6.1 5.1 6.4  HGB 13.9 15.4* 15.4* 15.1* 15.9*  HCT 46.9* 50.9* 52.6*  52.2* 54.1*  MCV 92.5 92.7 93.3 93.7 93.9  PLT 122* 112* 100* 108* 154   Basic Metabolic Panel: Recent Labs  Lab 08/10/20 0644 08/11/20 0416 08/12/20 0708 08/13/20 0645 08/14/20 1149  NA 138 139 141 140 140  K 4.3 4.0 3.5 4.0 3.8  CL 101 101 100 100 98  CO2 27 28 29  32 35*  GLUCOSE 102* 103* 87 115* 91  BUN 67* 56* 54* 47* 41*  CREATININE 1.60* 1.39* 1.28* 1.33* 1.20*  CALCIUM 8.5* 9.0 8.8* 8.9 9.0  MG  --   --   --  2.3 2.5*  PHOS  --   --   --  4.0 2.9   GFR: Estimated Creatinine Clearance: 47.9 mL/min (A) (by C-G formula based on SCr of 1.2 mg/dL (H)). Liver Function Tests: Recent Labs  Lab 08/13/20 0645  AST 21  ALT 33  ALKPHOS 35*  BILITOT 1.4*  PROT 5.6*  ALBUMIN 2.6*   No results for input(s): LIPASE, AMYLASE in the last 168 hours. No results for input(s): AMMONIA in the last 168 hours. Coagulation Profile: No results for input(s): INR, PROTIME in the last 168 hours. Cardiac Enzymes: Recent Labs  Lab 08/09/20 1152  CKTOTAL 61   BNP (last 3 results) No results for input(s): PROBNP in the last 8760 hours. HbA1C: No  results for input(s): HGBA1C in the last 72 hours. CBG: Recent Labs  Lab 08/13/20 2001 08/13/20 2357 08/14/20 0404 08/14/20 0754 08/14/20 1206  GLUCAP 154* 95 133* 114* 97   Lipid Profile: No results for input(s): CHOL, HDL, LDLCALC, TRIG, CHOLHDL, LDLDIRECT in the last 72 hours. Thyroid Function Tests: Recent Labs    08/13/20 1056  TSH 22.200*  FREET4 0.69   Anemia Panel: No results for input(s): VITAMINB12, FOLATE, FERRITIN, TIBC, IRON, RETICCTPCT in the last 72 hours. Sepsis Labs: No results for input(s): PROCALCITON, LATICACIDVEN in the last 168 hours.  Recent Results (from the past 240 hour(s))  Urine culture     Status: None   Collection Time: 08/05/20  3:33 PM   Specimen: In/Out Cath Urine  Result Value Ref Range Status   Specimen Description IN/OUT CATH URINE  Final   Special Requests NONE  Final   Culture   Final    NO GROWTH Performed at [facility], [address] [place], [place]    Report Status 08/07/2020 FINAL  Final  Blood Culture (routine x 2)     Status: None   Collection Time: 08/05/20  3:55 PM   Specimen: BLOOD  Result Value Ref Range Status   Specimen Description BLOOD RIGHT ANTECUBITAL  Final   Special Requests   Final    BOTTLES DRAWN AEROBIC AND ANAEROBIC Blood Culture results may not be optimal due to an inadequate volume of blood received in culture bottles   Culture   Final    NO GROWTH 5 DAYS Performed at [facility], [address] [place], [place]    Report Status 08/10/2020 FINAL  Final  Blood Culture (routine x 2)     Status: None   Collection Time: 08/05/20  4:05 PM   Specimen: BLOOD RIGHT ARM  Result Value Ref Range Status   Specimen Description BLOOD RIGHT ARM  Final   Special Requests   Final    BOTTLES DRAWN AEROBIC AND ANAEROBIC Blood Culture results may not be optimal due to an inadequate volume of blood received in culture bottles   Culture   Final  NO GROWTH 5 DAYS Performed at  [facility], [address] [place], [place] [address]    Report Status 08/10/2020 FINAL  Final  SARS Coronavirus 2 by RT PCR (hospital order, performed in [facility] hospital lab) Nasopharyngeal Nasopharyngeal Swab     Status: None   Collection Time: 08/05/20  4:16 PM   Specimen: Nasopharyngeal Swab  Result Value Ref Range Status   SARS Coronavirus 2 NEGATIVE NEGATIVE Final    Comment: (NOTE) SARS-CoV-2 target nucleic acids are NOT DETECTED.  The SARS-CoV-2 RNA is generally detectable in upper and lower respiratory specimens during the acute phase of infection. The lowest concentration of SARS-CoV-2 viral copies this assay can detect is 250 copies / mL. A negative result does not preclude SARS-CoV-2 infection and should not be used as the sole basis for treatment or other patient management decisions.  A negative result may occur with improper specimen collection / handling, submission of specimen other than nasopharyngeal swab, presence of viral mutation(s) within the areas targeted by this assay, and inadequate number of viral copies (<250 copies / mL). A negative result must be combined with clinical observations, patient history, and epidemiological information.  Fact Sheet for Patients:   BoilerBrush.com.cyhttps://www.fda.gov/media/136312/download  Fact Sheet for Healthcare Providers: https://pope.com/https://www.fda.gov/media/136313/download  This test is not yet approved or  cleared by the [place] FDA and has been authorized for detection and/or diagnosis of SARS-CoV-2 by FDA under an Emergency Use Authorization (EUA).  This EUA will remain in effect (meaning this test can be used) for the duration of the COVID-19 declaration under Section 564(b)(1) of the Act, 21 U.S.C. section 360bbb-3(b)(1), unless the authorization is terminated or revoked sooner.  Performed at [facility], [address] [place], [place] [address]   MRSA PCR Screening     Status: None   Collection Time:  08/06/20  3:09 AM   Specimen: Nasal Mucosa; Nasopharyngeal  Result Value Ref Range Status   MRSA by PCR NEGATIVE NEGATIVE Final    Comment:        The GeneXpert MRSA Assay (FDA approved for NASAL specimens only), is one component of a comprehensive MRSA colonization surveillance program. It is not intended to diagnose MRSA infection nor to guide or monitor treatment for MRSA infections. Performed at [facility], [address] [place], [place] [address]   Culture, respiratory (non-expectorated)     Status: None   Collection Time: 08/06/20  4:28 AM   Specimen: Tracheal Aspirate; Respiratory  Result Value Ref Range Status   Specimen Description TRACHEAL ASPIRATE  Final   Special Requests NONE  Final   Gram Stain   Final    FEW WBC PRESENT, PREDOMINANTLY PMN MODERATE GRAM POSITIVE COCCI IN CLUSTERS FEW GRAM NEGATIVE RODS    Culture   Final    RARE Normal respiratory flora-no Staph aureus or Pseudomonas seen Performed at [facility], [address] [place], [place] [address]    Report Status 08/09/2020 FINAL  Final  SARS Coronavirus 2 by RT PCR (hospital order, performed in [facility] hospital lab) Nasopharyngeal Nasopharyngeal Swab     Status: None   Collection Time: 08/06/20  4:39 AM   Specimen: Nasopharyngeal Swab  Result Value Ref Range Status   SARS Coronavirus 2 NEGATIVE NEGATIVE Final    Comment: (NOTE) SARS-CoV-2 target nucleic acids are NOT DETECTED.  The SARS-CoV-2 RNA is generally detectable in upper and lower respiratory specimens during the acute phase of infection. The lowest  concentration of SARS-CoV-2 viral copies this assay can detect is 250 copies / mL. A negative result does not preclude SARS-CoV-2 infection and should not be used as the sole basis for treatment or other patient management decisions.  A negative result may occur with improper specimen collection / handling, submission of specimen other than nasopharyngeal swab, presence  of viral mutation(s) within the areas targeted by this assay, and inadequate number of viral copies (<250 copies / mL). A negative result must be combined with clinical observations, patient history, and epidemiological information.  Fact Sheet for Patients:   BoilerBrush.com.cy  Fact Sheet for Healthcare Providers: https://pope.com/  This test is not yet approved or  cleared by the [place] FDA and has been authorized for detection and/or diagnosis of SARS-CoV-2 by FDA under an Emergency Use Authorization (EUA).  This EUA will remain in effect (meaning this test can be used) for the duration of the COVID-19 declaration under Section 564(b)(1) of the Act, 21 U.S.C. section 360bbb-3(b)(1), unless the authorization is terminated or revoked sooner.  Performed at [facility], [address], [place], [place] [address]   Culture, blood (routine x 2)     Status: None   Collection Time: 08/06/20  6:37 AM   Specimen: BLOOD  Result Value Ref Range Status   Specimen Description BLOOD SITE NOT SPECIFIED  Final   Special Requests   Final    BOTTLES DRAWN AEROBIC AND ANAEROBIC Blood Culture results may not be optimal due to an inadequate volume of blood received in culture bottles   Culture   Final    NO GROWTH 5 DAYS Performed at [facility], [address], [place], [place] [address]    Report Status 08/11/2020 FINAL  Final    Radiology Studies: No results found.  Scheduled Meds: . apixaban  10 mg Oral BID   Followed by  . [START ON 08/21/2020] apixaban  5 mg Oral BID  . Chlorhexidine Gluconate Cloth  6 each Topical Q0600  . hydrocortisone sod succinate (SOLU-CORTEF) inj  100 mg Intravenous BID  . insulin aspart  0-9 Units Subcutaneous Q4H  . ipratropium-albuterol  3 mL Nebulization BID  . [START ON 08/15/2020] levothyroxine  100 mcg Oral Q0600  . multivitamin with minerals  1 tablet Oral Daily  . nicotine  21 mg  Transdermal Daily  . senna  2 tablet Oral BID AC & HS   Continuous Infusions: . sodium chloride Stopped (08/11/20 1337)     LOS: 9 days    Time spent: 25 mins.    Cipriano Bunker, MD [facility]   If 7PM-7AM, please contact night-coverage

## 2020-08-14 NOTE — Discharge Instructions (Signed)
Information on my medicine - ELIQUIS (apixaban)  Why was Eliquis prescribed for you? Eliquis was prescribed to treat blood clots that may have been found in the veins of your legs (deep vein thrombosis) or in your lungs (pulmonary embolism) and to reduce the risk of them occurring again.  What do You need to know about Eliquis ? The starting dose is 10 mg (two 5 mg tablets) taken TWICE daily for the FIRST SEVEN (7) DAYS, then on 08/21/2020 the dose is reduced to ONE 5 mg tablet taken TWICE daily.  Eliquis may be taken with or without food.   Try to take the dose about the same time in the morning and in the evening. If you have difficulty swallowing the tablet whole please discuss with your pharmacist how to take the medication safely.  Take Eliquis exactly as prescribed and DO NOT stop taking Eliquis without talking to the doctor who prescribed the medication.  Stopping may increase your risk of developing a new blood clot.  Refill your prescription before you run out.  After discharge, you should have regular check-up appointments with your healthcare provider that is prescribing your Eliquis.    What do you do if you miss a dose? If a dose of ELIQUIS is not taken at the scheduled time, take it as soon as possible on the same day and twice-daily administration should be resumed. The dose should not be doubled to make up for a missed dose.  Important Safety Information A possible side effect of Eliquis is bleeding. You should call your healthcare provider right away if you experience any of the following: ? Bleeding from an injury or your nose that does not stop. ? Unusual colored urine (red or dark brown) or unusual colored stools (red or black). ? Unusual bruising for unknown reasons. ? A serious fall or if you hit your head (even if there is no bleeding).  Some medicines may interact with Eliquis and might increase your risk of bleeding or clotting while on Eliquis. To help  avoid this, consult your healthcare provider or pharmacist prior to using any new prescription or non-prescription medications, including herbals, vitamins, non-steroidal anti-inflammatory drugs (NSAIDs) and supplements.  This website has more information on Eliquis (apixaban): http://www.eliquis.com/eliquis/home

## 2020-08-14 NOTE — Care Management Important Message (Signed)
Important Message  Patient Details  Name: Miranda Cohen MRN: [id number] Date of Birth: [birth year]   Medicare Important Message Given:  Yes     Renie Ora 08/14/2020, 10:51 AM

## 2020-08-14 NOTE — Plan of Care (Signed)
  Problem: Activity: Goal: Ability to tolerate increased activity will improve Outcome: Progressing   

## 2020-08-15 LAB — GLUCOSE, CAPILLARY
Glucose-Capillary: 142 mg/dL — ABNORMAL HIGH (ref 70–99)
Glucose-Capillary: 173 mg/dL — ABNORMAL HIGH (ref 70–99)
Glucose-Capillary: 125 mg/dL — ABNORMAL HIGH (ref 70–99)
Glucose-Capillary: 217 mg/dL — ABNORMAL HIGH (ref 70–99)
Glucose-Capillary: 157 mg/dL — ABNORMAL HIGH (ref 70–99)
Glucose-Capillary: 128 mg/dL — ABNORMAL HIGH (ref 70–99)

## 2020-08-15 LAB — BASIC METABOLIC PANEL
Anion gap: 6 (ref 5–15)
BUN: 42 mg/dL — ABNORMAL HIGH (ref 8–23)
CO2: 32 mmol/L (ref 22–32)
Calcium: 8.5 mg/dL — ABNORMAL LOW (ref 8.9–10.3)
Chloride: 102 mmol/L (ref 98–111)
Creatinine, Ser: 1.17 mg/dL — ABNORMAL HIGH (ref 0.44–1.00)
GFR calc Af Amer: 52 mL/min — ABNORMAL LOW (ref >60)
GFR calc non Af Amer: 45 mL/min — ABNORMAL LOW (ref >60)
Glucose, Bld: 128 mg/dL — ABNORMAL HIGH (ref 70–99)
Potassium: 4.5 mmol/L (ref 3.5–5.1)
Sodium: 140 mmol/L (ref 135–145)

## 2020-08-15 LAB — CBC
HCT: 42.7 % (ref 36.0–46.0)
Hemoglobin: 12.4 g/dL (ref 12.0–15.0)
MCH: 28.1 pg (ref 26.0–34.0)
MCHC: 29 g/dL — ABNORMAL LOW (ref 30.0–36.0)
MCV: 96.6 fL (ref 80.0–100.0)
Platelets: 129 10*3/uL — ABNORMAL LOW (ref 150–400)
RBC: 4.42 MIL/uL (ref 3.87–5.11)
RDW: 15.3 % (ref 11.5–15.5)
WBC: 8.9 10*3/uL (ref 4.0–10.5)
nRBC: 0 % (ref 0.0–0.2)

## 2020-08-15 LAB — T3, FREE: T3, Free: 0.9 pg/mL — ABNORMAL LOW (ref 2.0–4.4)

## 2020-08-15 LAB — MAGNESIUM: Magnesium: 2.2 mg/dL (ref 1.7–2.4)

## 2020-08-15 LAB — PHOSPHORUS: Phosphorus: 3.7 mg/dL (ref 2.5–4.6)

## 2020-08-15 NOTE — Progress Notes (Signed)
Physical Therapy Treatment Patient Details Name: Miranda Cohen MRN: [id number] DOB: May 20, 1946 Today's Date: 08/15/2020    History of Present Illness Pt is 74 y.o. F with PMH of tobacco use who presented with exertional shortness of breath, cough, diarrhea, fatigue since 8/1.  Work-up significant for small PE, hypercarbia, likely COPD exacerbation.  Bipap was initiated, however hypercarbia failed to significantly improve and pt became more lethargic, pt was intubated 9/5, extubated 9/9    PT Comments    Pt had c/o dizziness when rolling to the right side.  She had + hall pike dix consistent with R posterior canal BPPV - tolerated Epley's maneuvar well and had no further c/o dizziness throughout session.  She demonstrated improved transfers today and was able to take a few steps.  Had assist of 2 for safety.  Pt was on 6 L O2 with VSS but with rest breaks.  Required min cues for safety.  Continue to advance mobility and further treat BPPV next session.     Follow Up Recommendations  CIR     Equipment Recommendations  Rolling walker with 5" wheels;Wheelchair (measurements PT);Wheelchair cushion (measurements PT);3in1 (PT)    Recommendations for Other Services Rehab consult     Precautions / Restrictions Precautions Precautions: Fall    Mobility  Bed Mobility Overal bed mobility: Needs Assistance Bed Mobility: Supine to Sit;Rolling Rolling: Min assist   Supine to sit: Mod assist;+2 for safety/equipment     General bed mobility comments: mod A to complete trunk elevation  Transfers Overall transfer level: Needs assistance Equipment used: Rolling walker (2 wheeled) Transfers: Sit to/from Stand Sit to Stand: Mod assist;Min assist;+2 physical assistance         General transfer comment: Performed once from bed with mod A of 2 and once from chair with min A of 2 for safety; cues for safe hand placement  Ambulation/Gait Ambulation/Gait assistance: Min assist;+2 physical  assistance;+2 safety/equipment Gait Distance (Feet): 2 Feet Assistive device: Rolling walker (2 wheeled) Gait Pattern/deviations: Trunk flexed;Shuffle;Step-to pattern Gait velocity: decreased   General Gait Details: Pt able to take small steps from bed to chair with cues for RW   Stairs             Wheelchair Mobility    Modified Rankin (Stroke Patients Only)       Balance Overall balance assessment: Needs assistance Sitting-balance support: No upper extremity supported;Feet supported;Single extremity supported Sitting balance-Leahy Scale: Fair Sitting balance - Comments: able to do without UE but fatigues and prefers to use at least 1 UE   Standing balance support: Bilateral upper extremity supported Standing balance-Leahy Scale: Poor Standing balance comment: required RW and min A                            Cognition Arousal/Alertness: Awake/alert Behavior During Therapy: WFL for tasks assessed/performed Overall Cognitive Status: Impaired/Different from baseline Area of Impairment: Memory;Problem solving;Awareness;Safety/judgement                     Memory: Decreased short-term memory   Safety/Judgement: Decreased awareness of deficits   Problem Solving: Requires verbal cues        Exercises      General Comments General comments (skin integrity, edema, etc.): Pt on 6 L HFNC with sats 95% dropping to 88% with activity but recovered in less than 20 seconds.  BP was 90's/50's but stable with position change. HR 70's-90's.   Pt  reports that she has been getting dizzy when rolling to the right.   Vestibular testing: EOEM/tracking: intact, no dizziness, no nystagmus Gaze Stabilization: Intact, no dizziness, no nystagmus [facility] L(use of bed tilt to position): negative [facility] R (use of bed tilt to position): positive, torsional nystagmus and symptoms that resolved in 30 seconds. Completed Epley's maneuvar for R posterior canal  BPPV - tolerated well.  No further c/o dizziness throughout session.       Pertinent Vitals/Pain Pain Assessment: No/denies pain    Home Living                      Prior Function            PT Goals (current goals can now be found in the care plan section) Acute Rehab PT Goals Patient Stated Goal: to go home PT Goal Formulation: With patient Time For Goal Achievement: 08/25/20 Potential to Achieve Goals: Good Progress towards PT goals: Progressing toward goals    Frequency    Min 3X/week      PT Plan Current plan remains appropriate    Co-evaluation              AM-PAC PT "6 Clicks" Mobility   Outcome Measure  Help needed turning from your back to your side while in a flat bed without using bedrails?: A Little Help needed moving from lying on your back to sitting on the side of a flat bed without using bedrails?: A Lot Help needed moving to and from a bed to a chair (including a wheelchair)?: A Little Help needed standing up from a chair using your arms (e.g., wheelchair or bedside chair)?: A Lot Help needed to walk in hospital room?: A Lot Help needed climbing 3-5 steps with a railing? : Total 6 Click Score: 13    End of Session Equipment Utilized During Treatment: Oxygen;Gait belt Activity Tolerance: Patient tolerated treatment well Patient left: with call bell/phone within reach;with chair alarm set;in chair Nurse Communication: Mobility status (assist of 2 for safety; + BPPV) PT Visit Diagnosis: Other abnormalities of gait and mobility (R26.89);Difficulty in walking, not elsewhere classified (R26.2)     Time: 3016-0109 PT Time Calculation (min) (ACUTE ONLY): 36 min  Charges:  $Therapeutic Activity: 8-22 mins $Canalith Rep Proc: 8-22 mins                     Anise Salvo, PT Acute Rehab Services Pager [phone] [facility] [phone]     Rayetta Humphrey 08/15/2020, 3:45 PM

## 2020-08-15 NOTE — Progress Notes (Signed)
PROGRESS NOTE    Miranda Cohen  XTK:[id number] DOB: Dec 17, 1945 DOA: 08/05/2020 PCP: Patient, No Pcp Per      Brief Narrative: 74 years old female with past medical history of prolonged tobacco use, history of of myxedema coma, medical noncompliance who presented in the ED with progressive shortness of breath for a month.  Few days back she was in the ED but she left AGAINST MEDICAL ADVICE.  During this hospitalization she was found lethargic with her O2 sat 50% on room air. CT chest found to have a small PE, cardiomegaly and moderate pericardial effusion.  She was initially admitted under hospitalist service, but due to ongoing instability.  she was admitted in ICU.  She was intubated on 9/5  remained in ICU for few days. Successfully extubated  9/9  , stable on high flow nasal cannula.  PCCM pickup 9/11.  .  Patient is admitted for acute hypoxic and hypercapnic respiratory failure secondary to COPD, PAH and acute PE. She was initially started on broad-spectrum antibiotics and then de-escalate to ceftriaxone. She completed antibiotics. Initially intubated and successfully extubated. She was on Haparin gtt transitioned to Eliquis 9/13 . Echocardiogram shows large posterior pericardial effusion, no evidence of tamponade.  Cardiology recommended continue treatment for hypothyroidism if effusion worsens or tamponade arises, need cardiothoracic surgery consult for pericardial window.  She is also being managed for myxedema coma with a history of chronic lymphocytic thyroiditis, continued on IV Synthroid, transitioned to PO synthroid. She is still requiring 8L of Oxygen, remains short of breath while talking.   Assessment & Plan:   Principal Problem:   Acute respiratory failure with hypoxia and hypercapnia (HCC) Active Problems:   Acute pulmonary embolism (HCC)   Pericardial effusion   COPD (chronic obstructive pulmonary disease) (HCC)   Renal insufficiency   Coronary artery calcification  Acute  respiratory failure with hypoxia and hypercapnia: - Could be multifactorial due to COPD, undiagnosed PAH, myxedema, acute PE - She was intubated on arrival 9/5: - She was successfully extubated on 9/9. - On 8L/ m O2 SAT 90%  Increased to 8L/m overnight  -Acute PE : On heparin gtt,  transitioned to Eliquis 9/13. - Continue O2 to keep SpO2 >90% - pulmonary hygiene, OOB mobility as able. - SLP swallow eval : Thin liquids - Continue inhalers.  Pericardial effusion could be sec.to myxedema. Echo: shows large posterior pericardial effusion, no evidence of tamponade.   Cardiology recommended continue treatment for hypothyroidism if effusion worsens or tamponade arises need cardiothoracic surgery consult for pericardial window.   -avoid over-diuresis. -monitor for hypotension.  Myxedema coma; h/o chronic lymphocytic thyroiditis - TSH 63, T4 0.4 on arrival.  -cont iv synthroid, transitioned to po synthroid 9/13. -Recheck TSH, FT3, FT4 in one week. -cautious diuresis given peripheral edema.  Lab Results  Component Value Date   TSH 22.200 (H) 08/13/2020   T3TOTAL 47 (L) 08/08/2020    Acute Kidney Injury >> Improving  -con't to monitor  con't strict I/Os  avoid nephrotoxic meds  Nausea, vomiting  -phenergan & zofran PRN for nausea  HTN -hydralazine PRN. -avoid overly controlling given concern for hypervolemia and need for diuresis but pre-load dependence;  hypotension should indicate diuresis should stop.   Tobacco abuse: -nicotine patch   DVT prophylaxis: Eliquis Code Status: Full Family Communication: spoke with daughter on phone. Disposition Plan:  Status is: Inpatient  Remains inpatient appropriate because:Hemodynamically unstable   Dispo: The patient is from: SNF  Anticipated d/c is to: SNF              Anticipated d/c date is: 3 days              Patient currently is not medically stable to d/c.   Consultants:   Cardiology,  PCCM  Procedures:  Antimicrobials: Anti-infectives (From admission, onward)   Start     Dose/Rate Route Frequency Ordered Stop   08/09/20 1000  cefTRIAXone (ROCEPHIN) 2 g in sodium chloride 0.9 % 100 mL IVPB        2 g 200 mL/hr over 30 Minutes Intravenous Every 24 hours 08/08/20 1133 08/10/20 1054   08/08/20 0500  vancomycin (VANCOCIN) IVPB 1000 mg/200 mL premix  Status:  Discontinued        1,000 mg 200 mL/hr over 60 Minutes Intravenous Every 48 hours 08/06/20 0436 08/06/20 0821   08/07/20 0600  ceFEPIme (MAXIPIME) 1 g in sodium chloride 0.9 % 100 mL IVPB  Status:  Discontinued        1 g 200 mL/hr over 30 Minutes Intravenous Every 24 hours 08/06/20 0436 08/06/20 0952   08/06/20 2100  ceFEPIme (MAXIPIME) 2 g in sodium chloride 0.9 % 100 mL IVPB  Status:  Discontinued        2 g 200 mL/hr over 30 Minutes Intravenous Every 12 hours 08/06/20 0952 08/08/20 1133   08/06/20 0530  vancomycin (VANCOREADY) IVPB 2000 mg/400 mL  Status:  Discontinued        2,000 mg 200 mL/hr over 120 Minutes Intravenous  Once 08/06/20 0430 08/06/20 0822   08/06/20 0530  ceFEPIme (MAXIPIME) 2 g in sodium chloride 0.9 % 100 mL IVPB        2 g 200 mL/hr over 30 Minutes Intravenous  Once 08/06/20 0430 08/06/20 0955     Subjective: Patient was seen and examined at bedside.  Overnight events noted.   Patient reports feeling better, and wants to go home.   She is till requiring 8L O2 to maintain sat above 90%. Patient gets short of breath while talking, she is very talkative. All questions answered.   Objective: Vitals:   08/15/20 0400 08/15/20 0746 08/15/20 0900 08/15/20 1151  BP: (!) 102/46     Pulse: 74     Resp: 16     Temp: 98.6 F (37 C) 97.9 F (36.6 C)  97.8 F (36.6 C)  TempSrc: Oral Oral  Oral  SpO2: 98%  94%   Weight: 104.6 kg     Height:        Intake/Output Summary (Last 24 hours) at 08/15/2020 1518 Last data filed at 08/15/2020 1502 Gross per 24 hour  Intake 840 ml  Output 900 ml  Net  -60 ml   Filed Weights   08/13/20 0039 08/14/20 0543 08/15/20 0400  Weight: 104 kg 104.6 kg 104.6 kg    Examination:  General exam: Appears comfortable , Alert, oriented, puffy, swollen.  Respiratory system: Clear to auscultation. Respiratory effort normal. Cardiovascular system: S1 & S2 heard, RRR. No JVD, murmurs, rubs, gallops or clicks. No pedal edema. Gastrointestinal system: Abdomen is nondistended, soft and nontender. No organomegaly or masses felt.   Normal bowel sounds heard. Central nervous system: Alert and oriented. No focal neurological deficits. Extremities:  Bilateral  Leg edema ++. Skin: No rashes, lesions or ulcers Psychiatry: Judgement and insight appear normal. Mood & affect appropriate.     Data Reviewed: I have personally reviewed following labs and imaging studies  CBC:  Recent Labs  Lab 08/11/20 0416 08/12/20 0708 08/13/20 0645 08/14/20 1020 08/15/20 0746  WBC 7.7 6.1 5.1 6.4 8.9  HGB 15.4* 15.4* 15.1* 15.9* 12.4  HCT 50.9* 52.6* 52.2* 54.1* 42.7  MCV 92.7 93.3 93.7 93.9 96.6  PLT 112* 100* 108* 154 129*   Basic Metabolic Panel: Recent Labs  Lab 08/11/20 0416 08/12/20 0708 08/13/20 0645 08/14/20 1149 08/15/20 0746  NA 139 141 140 140 140  K 4.0 3.5 4.0 3.8 4.5  CL 101 100 100 98 102  CO2 28 29 32 35* 32  GLUCOSE 103* 87 115* 91 128*  BUN 56* 54* 47* 41* 42*  CREATININE 1.39* 1.28* 1.33* 1.20* 1.17*  CALCIUM 9.0 8.8* 8.9 9.0 8.5*  MG  --   --  2.3 2.5* 2.2  PHOS  --   --  4.0 2.9 3.7   GFR: Estimated Creatinine Clearance: 49.1 mL/min (A) (by C-G formula based on SCr of 1.17 mg/dL (H)). Liver Function Tests: Recent Labs  Lab 08/13/20 0645  AST 21  ALT 33  ALKPHOS 35*  BILITOT 1.4*  PROT 5.6*  ALBUMIN 2.6*   No results for input(s): LIPASE, AMYLASE in the last 168 hours. No results for input(s): AMMONIA in the last 168 hours. Coagulation Profile: No results for input(s): INR, PROTIME in the last 168 hours. Cardiac  Enzymes: Recent Labs  Lab 08/09/20 1152  CKTOTAL 61   BNP (last 3 results) No results for input(s): PROBNP in the last 8760 hours. HbA1C: No results for input(s): HGBA1C in the last 72 hours. CBG: Recent Labs  Lab 08/14/20 2309 08/15/20 0406 08/15/20 0449 08/15/20 0735 08/15/20 1149  GLUCAP 99 142* 173* 125* 217*   Lipid Profile: No results for input(s): CHOL, HDL, LDLCALC, TRIG, CHOLHDL, LDLDIRECT in the last 72 hours. Thyroid Function Tests: Recent Labs    08/13/20 1056  TSH 22.200*  FREET4 0.69  T3FREE 0.9*   Anemia Panel: No results for input(s): VITAMINB12, FOLATE, FERRITIN, TIBC, IRON, RETICCTPCT in the last 72 hours. Sepsis Labs: No results for input(s): PROCALCITON, LATICACIDVEN in the last 168 hours.  Recent Results (from the past 240 hour(s))  Urine culture     Status: None   Collection Time: 08/05/20  3:33 PM   Specimen: In/Out Cath Urine  Result Value Ref Range Status   Specimen Description IN/OUT CATH URINE  Final   Special Requests NONE  Final   Culture   Final    NO GROWTH Performed at [facility], [address], [place], [place] [address]    Report Status 08/07/2020 FINAL  Final  Blood Culture (routine x 2)     Status: None   Collection Time: 08/05/20  3:55 PM   Specimen: BLOOD  Result Value Ref Range Status   Specimen Description BLOOD RIGHT ANTECUBITAL  Final   Special Requests   Final    BOTTLES DRAWN AEROBIC AND ANAEROBIC Blood Culture results may not be optimal due to an inadequate volume of blood received in culture bottles   Culture   Final    NO GROWTH 5 DAYS Performed at [facility], [address], [place], [place] [address]    Report Status 08/10/2020 FINAL  Final  Blood Culture (routine x 2)     Status: None   Collection Time: 08/05/20  4:05 PM   Specimen: BLOOD RIGHT ARM  Result Value Ref Range Status   Specimen Description BLOOD RIGHT ARM  Final   Special Requests   Final  BOTTLES DRAWN AEROBIC AND  ANAEROBIC Blood Culture results may not be optimal due to an inadequate volume of blood received in culture bottles   Culture   Final    NO GROWTH 5 DAYS Performed at [facility], [address] [place], [place] [address]    Report Status 08/10/2020 FINAL  Final  SARS Coronavirus 2 by RT PCR (hospital order, performed in [facility] hospital lab) Nasopharyngeal Nasopharyngeal Swab     Status: None   Collection Time: 08/05/20  4:16 PM   Specimen: Nasopharyngeal Swab  Result Value Ref Range Status   SARS Coronavirus 2 NEGATIVE NEGATIVE Final    Comment: (NOTE) SARS-CoV-2 target nucleic acids are NOT DETECTED.  The SARS-CoV-2 RNA is generally detectable in upper and lower respiratory specimens during the acute phase of infection. The lowest concentration of SARS-CoV-2 viral copies this assay can detect is 250 copies / mL. A negative result does not preclude SARS-CoV-2 infection and should not be used as the sole basis for treatment or other patient management decisions.  A negative result may occur with improper specimen collection / handling, submission of specimen other than nasopharyngeal swab, presence of viral mutation(s) within the areas targeted by this assay, and inadequate number of viral copies (<250 copies / mL). A negative result must be combined with clinical observations, patient history, and epidemiological information.  Fact Sheet for Patients:   BoilerBrush.com.cy  Fact Sheet for Healthcare Providers: https://pope.com/  This test is not yet approved or  cleared by the [place] FDA and has been authorized for detection and/or diagnosis of SARS-CoV-2 by FDA under an Emergency Use Authorization (EUA).  This EUA will remain in effect (meaning this test can be used) for the duration of the COVID-19 declaration under Section 564(b)(1) of the Act, 21 U.S.C. section 360bbb-3(b)(1), unless the authorization is  terminated or revoked sooner.  Performed at [facility], [address], [place], [place] [address]   MRSA PCR Screening     Status: None   Collection Time: 08/06/20  3:09 AM   Specimen: Nasal Mucosa; Nasopharyngeal  Result Value Ref Range Status   MRSA by PCR NEGATIVE NEGATIVE Final    Comment:        The GeneXpert MRSA Assay (FDA approved for NASAL specimens only), is one component of a comprehensive MRSA colonization surveillance program. It is not intended to diagnose MRSA infection nor to guide or monitor treatment for MRSA infections. Performed at [facility], [address] [place], [place] [address]   Culture, respiratory (non-expectorated)     Status: None   Collection Time: 08/06/20  4:28 AM   Specimen: Tracheal Aspirate; Respiratory  Result Value Ref Range Status   Specimen Description TRACHEAL ASPIRATE  Final   Special Requests NONE  Final   Gram Stain   Final    FEW WBC PRESENT, PREDOMINANTLY PMN MODERATE GRAM POSITIVE COCCI IN CLUSTERS FEW GRAM NEGATIVE RODS    Culture   Final    RARE Normal respiratory flora-no Staph aureus or Pseudomonas seen Performed at [facility] Lab, [address], [place], [place] [address]    Report Status 08/09/2020 FINAL  Final  SARS Coronavirus 2 by RT PCR (hospital order, performed in [facility] hospital lab) Nasopharyngeal Nasopharyngeal Swab     Status: None   Collection Time: 08/06/20  4:39 AM   Specimen: Nasopharyngeal Swab  Result Value Ref Range Status   SARS Coronavirus 2 NEGATIVE NEGATIVE Final  Comment: (NOTE) SARS-CoV-2 target nucleic acids are NOT DETECTED.  The SARS-CoV-2 RNA is generally detectable in upper and lower respiratory specimens during the acute phase of infection. The lowest concentration of SARS-CoV-2 viral copies this assay can detect is 250 copies / mL. A negative result does not preclude SARS-CoV-2 infection and should not be used as the sole basis for treatment or  other patient management decisions.  A negative result may occur with improper specimen collection / handling, submission of specimen other than nasopharyngeal swab, presence of viral mutation(s) within the areas targeted by this assay, and inadequate number of viral copies (<250 copies / mL). A negative result must be combined with clinical observations, patient history, and epidemiological information.  Fact Sheet for Patients:   BoilerBrush.com.cy  Fact Sheet for Healthcare Providers: https://pope.com/  This test is not yet approved or  cleared by the [place] FDA and has been authorized for detection and/or diagnosis of SARS-CoV-2 by FDA under an Emergency Use Authorization (EUA).  This EUA will remain in effect (meaning this test can be used) for the duration of the COVID-19 declaration under Section 564(b)(1) of the Act, 21 U.S.C. section 360bbb-3(b)(1), unless the authorization is terminated or revoked sooner.  Performed at [facility], [address] [place], [place] [address]   Culture, blood (routine x 2)     Status: None   Collection Time: 08/06/20  6:37 AM   Specimen: BLOOD  Result Value Ref Range Status   Specimen Description BLOOD SITE NOT SPECIFIED  Final   Special Requests   Final    BOTTLES DRAWN AEROBIC AND ANAEROBIC Blood Culture results may not be optimal due to an inadequate volume of blood received in culture bottles   Culture   Final    NO GROWTH 5 DAYS Performed at [facility] Lab, [address], [place], [place] [address]    Report Status 08/11/2020 FINAL  Final    Radiology Studies: No results found.  Scheduled Meds: . apixaban  10 mg Oral BID   Followed by  . [START ON 08/21/2020] apixaban  5 mg Oral BID  . Chlorhexidine Gluconate Cloth  6 each Topical Q0600  . hydrocortisone sod succinate (SOLU-CORTEF) inj  100 mg Intravenous BID  . insulin aspart  0-9 Units Subcutaneous Q4H  .  ipratropium-albuterol  3 mL Nebulization BID  . levothyroxine  100 mcg Oral Q0600  . multivitamin with minerals  1 tablet Oral Daily  . nicotine  21 mg Transdermal Daily  . senna  2 tablet Oral BID AC & HS   Continuous Infusions: . sodium chloride Stopped (08/11/20 1337)     LOS: 10 days    Time spent: 25 mins.    Cipriano Bunker, MD [facility]   If 7PM-7AM, please contact night-coverage

## 2020-08-16 DIAGNOSIS — E039 Hypothyroidism, unspecified: Secondary | ICD-10-CM

## 2020-08-16 DIAGNOSIS — N179 Acute kidney failure, unspecified: Secondary | ICD-10-CM

## 2020-08-16 DIAGNOSIS — E035 Myxedema coma: Secondary | ICD-10-CM | POA: Insufficient documentation

## 2020-08-16 DIAGNOSIS — I2699 Other pulmonary embolism without acute cor pulmonale: Secondary | ICD-10-CM

## 2020-08-16 DIAGNOSIS — I313 Pericardial effusion (noninflammatory): Secondary | ICD-10-CM

## 2020-08-16 LAB — BASIC METABOLIC PANEL
Anion gap: 8 (ref 5–15)
BUN: 46 mg/dL — ABNORMAL HIGH (ref 8–23)
CO2: 31 mmol/L (ref 22–32)
Calcium: 9 mg/dL (ref 8.9–10.3)
Chloride: 100 mmol/L (ref 98–111)
Creatinine, Ser: 1.16 mg/dL — ABNORMAL HIGH (ref 0.44–1.00)
GFR calc Af Amer: 53 mL/min — ABNORMAL LOW (ref >60)
GFR calc non Af Amer: 46 mL/min — ABNORMAL LOW (ref >60)
Glucose, Bld: 100 mg/dL — ABNORMAL HIGH (ref 70–99)
Potassium: 4.3 mmol/L (ref 3.5–5.1)
Sodium: 139 mmol/L (ref 135–145)

## 2020-08-16 LAB — CBC
HCT: 43.2 % (ref 36.0–46.0)
Hemoglobin: 12.4 g/dL (ref 12.0–15.0)
MCH: 27.9 pg (ref 26.0–34.0)
MCHC: 28.7 g/dL — ABNORMAL LOW (ref 30.0–36.0)
MCV: 97.1 fL (ref 80.0–100.0)
Platelets: 156 10*3/uL (ref 150–400)
RBC: 4.45 MIL/uL (ref 3.87–5.11)
RDW: 15.3 % (ref 11.5–15.5)
WBC: 10.2 10*3/uL (ref 4.0–10.5)
nRBC: 0 % (ref 0.0–0.2)

## 2020-08-16 LAB — MAGNESIUM: Magnesium: 2.3 mg/dL (ref 1.7–2.4)

## 2020-08-16 LAB — GLUCOSE, CAPILLARY
Glucose-Capillary: 125 mg/dL — ABNORMAL HIGH (ref 70–99)
Glucose-Capillary: 127 mg/dL — ABNORMAL HIGH (ref 70–99)
Glucose-Capillary: 135 mg/dL — ABNORMAL HIGH (ref 70–99)
Glucose-Capillary: 144 mg/dL — ABNORMAL HIGH (ref 70–99)
Glucose-Capillary: 150 mg/dL — ABNORMAL HIGH (ref 70–99)
Glucose-Capillary: 188 mg/dL — ABNORMAL HIGH (ref 70–99)

## 2020-08-16 LAB — ACETYLCHOLINE RECEPTOR AB, ALL
Acety choline binding ab: <0.03 nmol/L (ref 0.00–0.24)
Acetylchol Block Ab: 40 % — ABNORMAL HIGH (ref 0–25)
Acetylcholine Modulat Ab: <12 % (ref 0–20)

## 2020-08-16 LAB — PHOSPHORUS: Phosphorus: 3.7 mg/dL (ref 2.5–4.6)

## 2020-08-16 LAB — MUSK ANTIBODIES: MuSK Antibodies: <1 U/mL

## 2020-08-16 MED ORDER — FUROSEMIDE 20 MG PO TABS
20.0000 mg | ORAL_TABLET | Freq: Every day | ORAL | Status: DC
  Administered 2020-08-16 – 2020-08-17 (×2): 20 mg via ORAL
  Filled 2020-08-16 (×2): qty 1

## 2020-08-16 NOTE — Assessment & Plan Note (Signed)
resolved 

## 2020-08-16 NOTE — Assessment & Plan Note (Addendum)
--  secondary to severe hypothyroidism in the setting of untreated Chronic Lymphocytic Thyroiditis --Continue levothyroxine. Follow-up as an outpatient.

## 2020-08-16 NOTE — Progress Notes (Signed)
Nutrition Follow-up  DOCUMENTATION CODES:   Not applicable  INTERVENTION:   -Continue Magic cup TID with meals, each supplement provides 290 kcal and 9 grams of protein -Continue MVI with minerals daily  NUTRITION DIAGNOSIS:   Inadequate oral intake related to vomiting as evidenced by per patient/family report.  Ongoing  GOAL:   Patient will meet greater than or equal to 90% of their needs  Progressing   MONITOR:   PO intake, Supplement acceptance, Labs, Weight trends, Skin, I & O's  REASON FOR ASSESSMENT:   Consult Assessment of nutrition requirement/status  ASSESSMENT:   74 yo female admitted with SOB, required intubation shortly after admission. Work-up revealed small PE, suspected COPD exacerbation. PMH includes tobacco abuse.  Reviewed I/O's: +860 ml x 24 hours and +4.1 L since admission  UOP: 0 ml x 24 hours  Spoke with pt at bedside, who reports feeling much better today. She reports that her appetite has improved and consumes about half of her meal tray ("it so much food- whoever eats all of that is eating good"). Noted meal completion documented 75-100%.   PTA pt reports food appetite; she consumes 1-2 meals per day and snacks. Pt reports that this is her typical meal pattern and this has not changed in year.   Pt suspects she has lost weight during hospitalization, but unsure how much or what her UBW is.   Discussed importance of good meal and supplement intake to promote healing.   Medications reviewed and include solu-cortef and senna.  Labs reviewed: CBGS: 125-150 (inpatient orders for glycemic control are 0-9 units inuslin aspart every 4 hours).   NUTRITION - FOCUSED PHYSICAL EXAM:    Most Recent Value  Orbital Region No depletion  Upper Arm Region Mild depletion  Thoracic and Lumbar Region No depletion  Buccal Region No depletion  Temple Region No depletion  Clavicle Bone Region No depletion  Clavicle and Acromion Bone Region No depletion   Scapular Bone Region No depletion  Dorsal Hand Mild depletion  Patellar Region No depletion  Anterior Thigh Region No depletion  Posterior Calf Region No depletion  Edema (RD Assessment) Moderate  Hair Reviewed  Eyes Reviewed  Mouth Reviewed  Skin Reviewed  Nails Reviewed       Diet Order:   Diet Order            Diet Heart Room service appropriate? Yes; Fluid consistency: Thin  Diet effective now                 EDUCATION NEEDS:   Not appropriate for education at this time  Skin:  Skin Assessment: Reviewed RN Assessment (MASD to hip & abdomen)  Last BM:  08/14/20  Height:   Ht Readings from Last 1 Encounters:  08/05/20 5\' 5"  (1.651 m)    Weight:   Wt Readings from Last 1 Encounters:  08/16/20 105.7 kg    Ideal Body Weight:  56.8 kg  BMI:  Body mass index is 38.78 kg/m.  Estimated Nutritional Needs:   Kcal:  1700-1900  Protein:  100-110 gm  Fluid:  >/= 1.7 L    08/18/20, RD, LDN, CDCES Registered Dietitian II Certified Diabetes Care and Education Specialist Please refer to [facility] for RD and/or RD on-call/weekend/after hours pager

## 2020-08-16 NOTE — Progress Notes (Signed)
Physical Therapy Treatment Patient Details Name: Miranda Cohen MRN: [id number] DOB: February 03, 1946 Today's Date: 08/16/2020    History of Present Illness Pt is 74 y.o. F with PMH of tobacco use who presented with exertional shortness of breath, cough, diarrhea, fatigue since 8/1.  Work-up significant for small PE, hypercarbia, likely COPD exacerbation.  Bipap was initiated, however hypercarbia failed to significantly improve and pt became more lethargic, pt was intubated 9/5, extubated 9/9    PT Comments    Pt admitted with above diagnosis. Pt was able to ambulate with RW with min guard assist and cues for safety.  Requiring 8LO2 to maintain sats >90%.  Will continue to follow acutely.  No c/o dizziness after maneuver yesterday.  Pt currently with functional limitations due to balance and endurance deficits. Pt will benefit from skilled PT to increase their independence and safety with mobility to allow discharge to the venue listed below.     Follow Up Recommendations  CIR     Equipment Recommendations  Rolling walker with 5" wheels;Wheelchair (measurements PT);Wheelchair cushion (measurements PT);3in1 (PT)    Recommendations for Other Services Rehab consult     Precautions / Restrictions Precautions Precautions: Fall Restrictions Weight Bearing Restrictions: No    Mobility  Bed Mobility Overal bed mobility: Needs Assistance Bed Mobility: Supine to Sit;Rolling Rolling: Min assist   Supine to sit: Min assist     General bed mobility comments: Pt needed a little assist to come to EOB.  Dizziness resolved after treatment yesterday per pt  Transfers Overall transfer level: Needs assistance Equipment used: Rolling walker (2 wheeled) Transfers: Sit to/from Stand Sit to Stand: Min guard;+2 safety/equipment         General transfer comment: cues for hand placemnet  Ambulation/Gait Ambulation/Gait assistance: Min assist;+2 safety/equipment;Min guard Gait Distance (Feet): 45  Feet Assistive device: Rolling walker (2 wheeled) Gait Pattern/deviations: Trunk flexed;Shuffle;Step-to pattern Gait velocity: decreased Gait velocity interpretation: <1.31 ft/sec, indicative of household ambulator General Gait Details: Pt able to ambulate to door and back to chair with cues for RW. will have assist by son at home.  Had to use the O2 at 8L to keep sats >90%. Nurse aware.    Stairs             Wheelchair Mobility    Modified Rankin (Stroke Patients Only)       Balance Overall balance assessment: Needs assistance Sitting-balance support: No upper extremity supported;Feet supported;Single extremity supported Sitting balance-Leahy Scale: Fair Sitting balance - Comments: able to do without UE but fatigues and prefers to use at least 1 UE   Standing balance support: Bilateral upper extremity supported Standing balance-Leahy Scale: Poor Standing balance comment: required RW and min A                            Cognition Arousal/Alertness: Awake/alert Behavior During Therapy: WFL for tasks assessed/performed Overall Cognitive Status: Impaired/Different from baseline Area of Impairment: Memory;Problem solving;Awareness;Safety/judgement                     Memory: Decreased short-term memory   Safety/Judgement: Decreased awareness of deficits Awareness: Emergent Problem Solving: Requires verbal cues General Comments: Oriented but is demonstrating mild confusion.       Exercises General Exercises - Lower Extremity Ankle Circles/Pumps: AROM;Both;20 reps;Supine Heel Slides: AROM;Right;Left;10 reps;Supine    General Comments General comments (skin integrity, edema, etc.): Pt on 8L and staying above 90% with activity. Nurse  had incr from 6L earlier due to desaturation. Other VSS      Pertinent Vitals/Pain Pain Assessment: No/denies pain    Home Living                      Prior Function            PT Goals (current  goals can now be found in the care plan section) Acute Rehab PT Goals Patient Stated Goal: to go home Progress towards PT goals: Progressing toward goals    Frequency    Min 3X/week      PT Plan Current plan remains appropriate    Co-evaluation              AM-PAC PT "6 Clicks" Mobility   Outcome Measure  Help needed turning from your back to your side while in a flat bed without using bedrails?: A Little Help needed moving from lying on your back to sitting on the side of a flat bed without using bedrails?: A Lot Help needed moving to and from a bed to a chair (including a wheelchair)?: A Little Help needed standing up from a chair using your arms (e.g., wheelchair or bedside chair)?: A Lot Help needed to walk in hospital room?: A Lot Help needed climbing 3-5 steps with a railing? : Total 6 Click Score: 13    End of Session Equipment Utilized During Treatment: Oxygen;Gait belt Activity Tolerance: Patient tolerated treatment well Patient left: with call bell/phone within reach;with chair alarm set;in chair Nurse Communication: Mobility status PT Visit Diagnosis: Other abnormalities of gait and mobility (R26.89);Difficulty in walking, not elsewhere classified (R26.2)     Time: 0175-1025 PT Time Calculation (min) (ACUTE ONLY): 16 min  Charges:  $Gait Training: 8-22 mins                     Dak Szumski W,PT Acute Rehabilitation Services Pager:  [phone]  Office:  [phone]     Berline Lopes 08/16/2020, 2:02 PM

## 2020-08-16 NOTE — Progress Notes (Signed)
Patient with no urine output overnight.  Bladder scan this morning only showed a max of .

## 2020-08-16 NOTE — Progress Notes (Signed)
Occupational Therapy Treatment Patient Details Name: Miranda Cohen MRN: [id number] DOB: 03/07/1946 Today's Date: 08/16/2020    History of present illness Pt is 74 y.o. F with PMH of tobacco use who presented with exertional shortness of breath, cough, diarrhea, fatigue since 8/1.  Work-up significant for small PE, hypercarbia, likely COPD exacerbation.  Bipap was initiated, however hypercarbia failed to significantly improve and pt became more lethargic, pt was intubated 9/5, extubated 9/9   OT comments  Pt received sitting up in chair, reports "ready to get back in bed, been sitting in chair since earlier." Attempted to stand from chair with pt with +2 assist and use of RW, VC's for hand placement. Pt unable to tolerate pushing up to stand with chair due to increased weakness. Once positioned back in seat, pt slowly and safely lowered to floor with no signs of injury or complaints. +3 required to lift patient and steady provided to assist pt back to bed. Bowel incontinence observed at the time and required Max A to Total of hygiene and changing of gown. At time of session, pt was on 6L 02 with highest O2 rate of 104 only when communicating with doctor. Pt slowly progressing and will benefit from continued skilled OT to address weakness and stability for safety with ADL function. DC and Freq remains the same.    Follow Up Recommendations  CIR;Supervision/Assistance - 24 hour    Equipment Recommendations  Other (comment) (TBD next venue)    Recommendations for Other Services      Precautions / Restrictions Precautions Precautions: Fall Restrictions Weight Bearing Restrictions: No       Mobility Bed Mobility Overal bed mobility: Needs Assistance Bed Mobility: Sit to Supine Rolling: Min assist   Supine to sit: Min assist Sit to supine: Max assist;+2 for physical assistance   General bed mobility comments: +2 to present BLE into bed for sidelying on L side.   Transfers Overall  transfer level: Needs assistance Equipment used: Rolling walker (2 wheeled) Transfers: Sit to/from Stand Sit to Stand: +2 safety/equipment;Max assist;+2 physical assistance         General transfer comment: cues for hand placemnet    Balance Overall balance assessment: Needs assistance Sitting-balance support: No upper extremity supported;Feet supported;Single extremity supported Sitting balance-Leahy Scale: Fair Sitting balance - Comments: able to do without UE but fatigues and prefers to use at least 1 UE   Standing balance support: Bilateral upper extremity supported Standing balance-Leahy Scale: Poor Standing balance comment: decreased strength and stability to return back to bed or perform toilet transfer. requied steady.                           ADL either performed or assessed with clinical judgement   ADL Overall ADL's : Needs assistance/impaired                         Toilet Transfer: +2 for physical assistance;Total assistance;Maximal assistance;BSC;+2 for safety/equipment (steady required) Toilet Transfer Details (indicate cue type and reason): Pt received sitting built up chair, reports ready to get back in bed. Attempted to power up to stand with Max A+2 with RW and VC's for hand placement. Pt too weakness to stand and had to be lowered to follow. Once present back up steady avaible to present pt back to bed.  Toileting- Clothing Manipulation and Hygiene: Total assistance       Functional mobility during ADLs: +2  for safety/equipment;Maximal assistance;Cueing for safety;Cueing for sequencing (max A due to increased weakness while seated in chair.) General ADL Comments: Pt lowered safely to floor due to inability to stand from weakness of LE even with +2 assist.      Vision       Perception     Praxis      Cognition Arousal/Alertness: Awake/alert Behavior During Therapy: WFL for tasks assessed/performed Overall Cognitive Status:  Impaired/Different from baseline Area of Impairment: Memory;Problem solving;Awareness;Safety/judgement                     Memory: Decreased short-term memory   Safety/Judgement: Decreased awareness of deficits Awareness: Emergent Problem Solving: Requires verbal cues General Comments: Oriented but is demonstrating mild confusion.         Exercises General Exercises - Lower Extremity Ankle Circles/Pumps: AROM;Both;20 reps;Supine Heel Slides: AROM;Right;Left;10 reps;Supine   Shoulder Instructions       General Comments Pt on 6L 02 with HR ~97-104 range    Pertinent Vitals/ Pain       Pain Assessment: No/denies pain  Home Living                                          Prior Functioning/Environment              Frequency  Min 2X/week        Progress Toward Goals  OT Goals(current goals can now be found in the care plan section)  Progress towards OT goals: Progressing toward goals (slowly progressing.)  Acute Rehab OT Goals Patient Stated Goal: to go home OT Goal Formulation: With patient Time For Goal Achievement: 08/25/20 Potential to Achieve Goals: Good ADL Goals Pt Will Perform Grooming: with modified independence;standing Pt Will Perform Upper Body Bathing: with modified independence;sitting Pt Will Perform Lower Body Bathing: with modified independence;sit to/from stand Pt Will Perform Upper Body Dressing: with modified independence;sitting Pt Will Perform Lower Body Dressing: with modified independence;sit to/from stand Pt Will Transfer to Toilet: with modified independence;ambulating;regular height toilet;grab bars Pt Will Perform Toileting - Clothing Manipulation and hygiene: with modified independence;sit to/from stand Additional ADL Goal #1: Pt will be Mod I in and OOB for basic ADLs  Plan      Co-evaluation                 AM-PAC OT "6 Clicks" Daily Activity     Outcome Measure   Help from another person  eating meals?: A Little Help from another person taking care of personal grooming?: A Little Help from another person toileting, which includes using toliet, bedpan, or urinal?: A Lot Help from another person bathing (including washing, rinsing, drying)?: A Lot Help from another person to put on and taking off regular upper body clothing?: A Lot Help from another person to put on and taking off regular lower body clothing?: Total 6 Click Score: 13    End of Session Equipment Utilized During Treatment: Gait belt;Oxygen (SW, 12 liters HFNC)  OT Visit Diagnosis: Unsteadiness on feet (R26.81);Other abnormalities of gait and mobility (R26.89);Muscle weakness (generalized) (M62.81);Other symptoms and signs involving cognitive function   Activity Tolerance Other (comment);Patient tolerated treatment well (reports legs gave out from attempt to stand from chair)   Patient Left with call bell/phone within reach;in bed;with bed alarm set;with nursing/sitter in room   Nurse Communication Mobility status  Time: 3710-6269 OT Time Calculation (min): 49 min  Charges: OT General Charges $OT Visit: 1 Visit OT Treatments $Self Care/Home Management : 38-52 mins  Marquette Old, MSOT, OTR/L  Supplemental Rehabilitation Services  [phone]    Zigmund Daniel 08/16/2020, 4:38 PM

## 2020-08-16 NOTE — Assessment & Plan Note (Addendum)
--  Stable, continue apixaban.

## 2020-08-16 NOTE — Progress Notes (Signed)
     08/16/20 1500  What Happened  Was fall witnessed? Yes  Who witnessed fall? Evan   Patients activity before fall ambulating-assisted  Point of contact buttocks  Was patient injured? No  Follow Up  MD notified Irene Limbo  Time MD notified 1500  Family notified No - patient refusal  Additional tests No  Progress note created (see row info) Yes  Adult Fall Risk Assessment  Risk Factor Category (scoring not indicated) Fall has occurred during this admission (document High fall risk)  Age 74  Fall History: Fall within 6 months prior to admission 0  Elimination; Bowel and/or Urine Incontinence 0  Elimination; Bowel and/or Urine Urgency/Frequency 2  Medications: includes PCA/Opiates, Anti-convulsants, Anti-hypertensives, Diuretics, Hypnotics, Laxatives, Sedatives, and Psychotropics 5  Patient Care Equipment 2  Mobility-Assistance 2  Mobility-Gait 2  Mobility-Sensory Deficit 0  Altered awareness of immediate physical environment 0  Impulsiveness 0  Lack of understanding of one's physical/cognitive limitations 0  Total Score 15  Patient Fall Risk Level High fall risk  Adult Fall Risk Interventions  Required Bundle Interventions *See Row Information* High fall risk - low, moderate, and high requirements implemented  Additional Interventions Use of appropriate toileting equipment (bedpan, BSC, etc.);PT/OT need assessed if change in mobility from baseline  Screening for Fall Injury Risk (To be completed on HIGH fall risk patients) - Assessing Need for Low Bed  Risk For Fall Injury- Low Bed Criteria None identified - Continue screening  Screening for Fall Injury Risk (To be completed on HIGH fall risk patients who do not meet crieteria for Low Bed) - Assessing Need for Floor Mats Only  Risk For Fall Injury- Criteria for Floor Mats None identified - No additional interventions needed  Will Implement Floor Mats Yes  Oxygen Therapy  Heater temperature 44.6 F (7 C)  Pain Assessment  Pain  Scale 0-10  Pain Score 0  Critical Care Pain Observation Tool (CPOT)  Facial Expression 0  Body Movements 0  Muscle Tension 0  Compliance with ventilator (intubated pts.) N/A  Vocalization (extubated pts.) 0  CPOT Total 0  PCA/Epidural/Spinal Assessment  Respiratory Pattern Regular;Unlabored  Neurological  Neuro (WDL) WDL  Level of Consciousness Alert  Orientation Level Oriented X4  Cognition Appropriate at baseline  Speech Clear  Pupil Assessment  No  Musculoskeletal  Musculoskeletal (WDL) X  Assistive Device BSC;Front wheel walker  Generalized Weakness Yes  Weight Bearing Restrictions No  Integumentary  Integumentary (WDL) X  Skin Color Appropriate for ethnicity  Skin Condition Dry  Skin Integrity Weeping  Ecchymosis Location Hand;Arm;Groin  Ecchymosis Location Orientation Bilateral  Ecchymosis Intervention Other (Comment) (assessed)  Weeping Location Arm  Weeping Location Orientation Bilateral  Weeping Intervention Other (Comment) (assessed)  Skin Turgor Non-tenting  Pain Screening  Clinical Progression Not changed  BP (!) 134/92 (BP Location: Right Arm)   Pulse 81   Temp 98.6 F (37 C) (Oral)   Resp 18   Ht 5\' 5"  (1.651 m)   Wt 105.7 kg   SpO2 95%   BMI 38.78 kg/m   Was called into pts room by OT therapist , pt needed movement assistance to [facility] . Pt stated "my legs are going weak" and pt started to lower. OT therapist and RN lowered pt to floor. PT states she is "not hurt" and "doesn't want [facility] to bother son".  Vital signs checked Pt returned to bed via lift  MD notified of event

## 2020-08-16 NOTE — Hospital Course (Addendum)
74 year old woman no PMH (hadn't seen doctor in 20 years) presented with acute respiratory failure, found to have PE, pericardial effusion, COPD exacerbation. Was placed on BiPAP but failed to improve and was intubated and admitted to the ICU by PCCM. Required vasopressors. Seen by cardiology for large pericardial effusion. Found to hav emyxedema coma secondary to severe hypothyroidism. Treated with IV antibiotics, successfully extubated.  Treated for PE with heparin infusion and then transition to apixaban.  Echocardiogram showed low posterior pericardial effusion with no evidence of tamponade.  Cardiology thought secondary to myxedema.  Recommended treating hypothyroidism and follow-up in the outpatient setting.

## 2020-08-16 NOTE — Progress Notes (Signed)
PROGRESS NOTE  Miranda Cohen GGY:[id number] DOB: 26-Dec-1945 DOA: 08/05/2020 PCP: Patient, No Pcp Per  Brief History   74 year old woman presented with acute respiratory failure, was intubated and admitted to the ICU by PCCM.  Treated with IV antibiotics, successfully extubated.  Treated for PE with heparin infusion and then transition to apixaban.  Echocardiogram showed low posterior pericardial effusion with no evidence of tamponade.  Cardiology thought secondary to myxedema.  Recommended treating hypothyroidism and follow-up in the outpatient setting.   A & P  Acute pulmonary embolism (HCC) --Stable.  Continue apixaban.  Myxedema --Continue levothyroxine.  Follow-up as an outpatient.  Acute respiratory failure with hypoxia and hypercapnia (HCC) --Required intubation.  Successfully extubated. --Still on high flow nasal cannula but down to 5 to 6 L.  Wean as tolerated.  Pericardial effusion --Thought secondary to myxedema.  Cardiology recommended treating with Synthroid.  Follow-up as an outpatient.  AKI (acute kidney injury) (HCC) --resolved   Disposition Plan:  Discussion: Improving.  Wean oxygen as tolerated.  Status is: Inpatient  Remains inpatient appropriate because:Inpatient level of care appropriate due to severity of illness   Dispo: The patient is from: Home              Anticipated d/c is to: Home              Anticipated d/c date is: 2 days              Patient currently is not medically stable to d/c.  DVT prophylaxis:   apixaban (ELIQUIS) tablet 10 mg  apixaban (ELIQUIS) tablet 5 mg  Code Status: Full Family Communication:   Brendia Sacks, MD  [facility] Direct contact: see [email or web address] (further directions at bottom of note if needed) 7PM-7AM contact night coverage as at bottom of note 08/16/2020, 8:06 PM  LOS: 11 days   Significant Hospital Events   .    Consults:  .    Procedures:  .   Significant Diagnostic Tests:  Marland Kitchen    Micro Data:   .    Antimicrobials:  .   Interval History/Subjective  Feels well, breathing fine, no complaints  Objective   Vitals:  Vitals:   08/16/20 1441 08/16/20 1938  BP: (!) 134/92 (!) 127/51  Pulse:  (!) 104  Resp: 18 20  Temp: 98.6 F (37 C) 98.7 F (37.1 C)  SpO2: 95% 99%    Exam:  Constitutional:   . Appears calm and comfortable sitting in chair ENMT:  . grossly normal hearing  Respiratory:  . CTA bilaterally, no w/r/r.  . Respiratory effort mildly increased but speaks in full sentences. . On HFNC 5-6 Cardiovascular:  . RRR, no m/r/g . ST w/ brief SVT . 2+ bilateral LE extremity edema   Psychiatric:  . Mental status o Mood, affect appropriate . judgment and insight appear intact   I have personally reviewed the following:   Today's Data  . BMP, Phos, Mg noted . CBC unremarkable  Scheduled Meds: . apixaban  10 mg Oral BID   Followed by  . [START ON 08/21/2020] apixaban  5 mg Oral BID  . Chlorhexidine Gluconate Cloth  6 each Topical Q0600  . furosemide  20 mg Oral Daily  . hydrocortisone sod succinate (SOLU-CORTEF) inj  100 mg Intravenous BID  . insulin aspart  0-9 Units Subcutaneous Q4H  . ipratropium-albuterol  3 mL Nebulization BID  . levothyroxine  100 mcg Oral Q0600  . multivitamin with minerals  1 tablet Oral  Daily  . nicotine  21 mg Transdermal Daily  . senna  2 tablet Oral BID AC & HS   Continuous Infusions: . sodium chloride Stopped (08/11/20 1337)    Principal Problem:   Acute respiratory failure with hypoxia and hypercapnia (HCC) Active Problems:   Myxedema   Acute pulmonary embolism (HCC)   Pericardial effusion   COPD (chronic obstructive pulmonary disease) (HCC)   Coronary artery calcification   AKI (acute kidney injury) (HCC)   LOS: 11 days   How to contact the [facility] Attending or Consulting provider 7A - 7P or covering provider during after hours 7P -7A, for this patient?  1. Check the care team in [facility] and look for a)  attending/consulting TRH provider listed and b) the [facility] team listed 2. Log into [email or web address] and use [place]'s universal password to access. If you do not have the password, please contact the hospital operator. 3. Locate the [facility] provider you are looking for under [facility] and page to a number that you can be directly reached. 4. If you still have difficulty reaching the provider, please page the [facility] (Director on Call) for the Hospitalists listed on amion for assistance.

## 2020-08-16 NOTE — Assessment & Plan Note (Addendum)
--  Required intubation.  Successfully extubated. --Still on high flow nasal cannula but now stable on 6 L.  Wean as tolerated.

## 2020-08-16 NOTE — Assessment & Plan Note (Addendum)
--  Thought secondary to myxedema.  Cardiology recommended treating with Synthroid.  --no evidence of tamponade --plan repeat limited echo as outpt about 9/21 to monitor pericardial effusion and followup as outpt with cardiology

## 2020-08-17 LAB — CBC
HCT: 38.5 % (ref 36.0–46.0)
Hemoglobin: 10.8 g/dL — ABNORMAL LOW (ref 12.0–15.0)
MCH: 27.1 pg (ref 26.0–34.0)
MCHC: 28.1 g/dL — ABNORMAL LOW (ref 30.0–36.0)
MCV: 96.7 fL (ref 80.0–100.0)
Platelets: 151 10*3/uL (ref 150–400)
RBC: 3.98 MIL/uL (ref 3.87–5.11)
RDW: 15.3 % (ref 11.5–15.5)
WBC: 9.2 10*3/uL (ref 4.0–10.5)
nRBC: 0 % (ref 0.0–0.2)

## 2020-08-17 LAB — GLUCOSE, CAPILLARY
Glucose-Capillary: 101 mg/dL — ABNORMAL HIGH (ref 70–99)
Glucose-Capillary: 138 mg/dL — ABNORMAL HIGH (ref 70–99)
Glucose-Capillary: 166 mg/dL — ABNORMAL HIGH (ref 70–99)
Glucose-Capillary: 189 mg/dL — ABNORMAL HIGH (ref 70–99)
Glucose-Capillary: 200 mg/dL — ABNORMAL HIGH (ref 70–99)
Glucose-Capillary: 201 mg/dL — ABNORMAL HIGH (ref 70–99)

## 2020-08-17 MED ORDER — FUROSEMIDE 40 MG PO TABS
40.0000 mg | ORAL_TABLET | Freq: Every day | ORAL | Status: DC
  Administered 2020-08-18: 40 mg via ORAL
  Filled 2020-08-17: qty 1

## 2020-08-17 NOTE — Progress Notes (Signed)
Inpatient Rehabilitation Admissions Coordinator  Inpatient rehab consult received. I met with patient at bedside for rehab assessment. Patient states she has not seen a MD in 24 years, never used oxygen before and came to hospital due to back pain. She questions why everyone keeps discussing her needing more therapy. She request to d/c home with [facility] for she has a son that can provide 24/7 assist. I do recommend further rehab, but patient not in agreement nor do I have a Bed at [facility] this week for this patient. Recommend further d/c planning discussion with patient to include other rehab venue options.  Danne Baxter, RN, MSN Rehab Admissions Coordinator [phone] 08/17/2020 2:27 PM

## 2020-08-17 NOTE — Progress Notes (Signed)
PROGRESS NOTE  Miranda Cohen WGN:[id number] DOB: 10/14/1946 DOA: 08/05/2020 PCP: Patient, No Pcp Per  Brief History   74 year old woman presented with acute respiratory failure, was intubated and admitted to the ICU by PCCM.  Treated with IV antibiotics, successfully extubated.  Treated for PE with heparin infusion and then transition to apixaban.  Echocardiogram showed low posterior pericardial effusion with no evidence of tamponade.  Cardiology thought secondary to myxedema.  Recommended treating hypothyroidism and follow-up in the outpatient setting.   A & P  Acute pulmonary embolism (HCC) --Stable, continue apixaban.  Myxedema --Continue levothyroxine. Follow-up as an outpatient.  Acute respiratory failure with hypoxia and hypercapnia (HCC) --Required intubation.  Successfully extubated. --Still on high flow nasal cannula but now stable on 6 L.  Wean as tolerated.  Pericardial effusion --Thought secondary to myxedema.  Cardiology recommended treating with Synthroid.  Follow-up as an outpatient.   AKI (acute kidney injury) (HCC) --resolved  COPD (chronic obstructive pulmonary disease) (HCC) --appears stable   Disposition Plan:  Discussion: overall improving; refused CIR, SNF and even HH. Has no PCP currently. Will need close outpt f/u, discussed need for PCP, oxygen, all new meds, f/u w/ cardiology and recs for therapy. Will discuss again in AM. Nearing discharge but outpt coordination of care will be challenging and risk of readmit is high. Stable on 6L.  Status is: Inpatient  Remains inpatient appropriate because:Inpatient level of care appropriate due to severity of illness   Dispo: The patient is from: Home              Anticipated d/c is to: Home              Anticipated d/c date is: 1 day              Patient currently is not medically stable to d/c.  DVT prophylaxis:   apixaban (ELIQUIS) tablet 10 mg  apixaban (ELIQUIS) tablet 5 mg  Code Status: Full Family  Communication:   Brendia Sacks, MD  [facility] Direct contact: see [email or web address] (further directions at bottom of note if needed) 7PM-7AM contact night coverage as at bottom of note 08/17/2020, 7:49 PM  LOS: 12 days   Significant Hospital Events   .    Consults:  .    Procedures:  .   Significant Diagnostic Tests:  Marland Kitchen    Micro Data:  .    Antimicrobials:  .   Interval History/Subjective  Feels fine, no complaints, refuses rehab, wants to go home. Breathing fine, eating fine.  Objective   Vitals:  Vitals:   08/17/20 0859 08/17/20 1942  BP:    Pulse:    Resp:    Temp:    SpO2: 94% 92%    Exam:  Constitutional:   . Appears calm and comfortable sitting in bed ENMT:  . grossly normal hearing  Respiratory:  . CTA bilaterally, no w/r/r.  . Respiratory effort normal.  Cardiovascular:  . RRR, no m/r/g . 2+ BLE extremity edema   . Psychiatric:  . Mental status o Mood, affect appropriate  I have personally reviewed the following:   Today's Data  . +3.4 L if I/O accurate . CBG stable . Hgb lower at 10.8 may not be sig  Scheduled Meds: . apixaban  10 mg Oral BID   Followed by  . [START ON 08/21/2020] apixaban  5 mg Oral BID  . Chlorhexidine Gluconate Cloth  6 each Topical Q0600  . [START ON 08/18/2020] furosemide  40 mg  Oral Daily  . insulin aspart  0-9 Units Subcutaneous Q4H  . ipratropium-albuterol  3 mL Nebulization BID  . levothyroxine  100 mcg Oral Q0600  . multivitamin with minerals  1 tablet Oral Daily  . nicotine  21 mg Transdermal Daily  . senna  2 tablet Oral BID AC & HS   Continuous Infusions: . sodium chloride Stopped (08/11/20 1337)    Principal Problem:   Acute respiratory failure with hypoxia and hypercapnia (HCC) Active Problems:   Myxedema   Acute pulmonary embolism (HCC)   Pericardial effusion   COPD (chronic obstructive pulmonary disease) (HCC)   Coronary artery calcification   AKI (acute kidney injury) (HCC)   LOS:  12 days   How to contact the [facility] Attending or Consulting provider 7A - 7P or covering provider during after hours 7P -7A, for this patient?  1. Check the care team in [facility] and look for a) attending/consulting TRH provider listed and b) the [facility] team listed 2. Log into [email or web address] and use [place]'s universal password to access. If you do not have the password, please contact the hospital operator. 3. Locate the [facility] provider you are looking for under [facility] and page to a number that you can be directly reached. 4. If you still have difficulty reaching the provider, please page the [facility] (Director on Call) for the Hospitalists listed on amion for assistance.

## 2020-08-17 NOTE — Assessment & Plan Note (Signed)
--  appears stable 

## 2020-08-17 NOTE — TOC Progression Note (Signed)
Transition of Care [facility]) - Progression Note    Patient Details  Name: Miranda Cohen MRN: [id number] Date of Birth: 1946/05/20  Transition of Care [facility]) CM/SW Contact  Reola Mosher Transition of Care Supervisor Phone Number: [phone] 08/17/2020, 3:16 PM  Clinical Narrative:    Received message from [place] with CIR, pt is refusing rehab and is requesting to go home. CM talked to patient at the bedside, she is very [facility]; she does not want any rehab - CIR or SNF and is requesting to go home at discharge; CM offered HHC services, pt refused all HHC services, stated that she has lots of family member that can take care of her at home. I explained the benefit of HHC services but again patient refused. No PCP, pt stated that she has not seen an MD in years and that she is fine. Patient has Medicare A ( not part B because she stated that she could not afford to pay for it). The coverage for Medicare part A includes Inpt hosp, SNF without Therapy ,Hospice and HHC services. No prescription drug coverage; she doe not have a pharmacy of choice. Very difficult case. TOC will continue to follow for progression of care.   Expected Discharge Plan: Home w Home Health Services Barriers to Discharge: No Barriers Identified  Expected Discharge Plan and Services Expected Discharge Plan: Home w Home Health Services                                               Social Determinants of Health (SDOH) Interventions    Readmission Risk Interventions No flowsheet data found.

## 2020-08-18 ENCOUNTER — Encounter (HOSPITAL_COMMUNITY): Payer: Self-pay | Admitting: Internal Medicine

## 2020-08-18 DIAGNOSIS — E063 Autoimmune thyroiditis: Secondary | ICD-10-CM

## 2020-08-18 DIAGNOSIS — I700 Atherosclerosis of aorta: Secondary | ICD-10-CM

## 2020-08-18 DIAGNOSIS — E039 Hypothyroidism, unspecified: Secondary | ICD-10-CM | POA: Diagnosis present

## 2020-08-18 DIAGNOSIS — G9341 Metabolic encephalopathy: Secondary | ICD-10-CM

## 2020-08-18 DIAGNOSIS — J439 Emphysema, unspecified: Secondary | ICD-10-CM

## 2020-08-18 DIAGNOSIS — A419 Sepsis, unspecified organism: Secondary | ICD-10-CM

## 2020-08-18 DIAGNOSIS — I739 Peripheral vascular disease, unspecified: Secondary | ICD-10-CM

## 2020-08-18 DIAGNOSIS — E035 Myxedema coma: Secondary | ICD-10-CM

## 2020-08-18 HISTORY — DX: Atherosclerosis of aorta: I70.0

## 2020-08-18 HISTORY — DX: Autoimmune thyroiditis: E06.3

## 2020-08-18 HISTORY — DX: Emphysema, unspecified: J43.9

## 2020-08-18 LAB — GLUCOSE, CAPILLARY
Glucose-Capillary: 102 mg/dL — ABNORMAL HIGH (ref 70–99)
Glucose-Capillary: 102 mg/dL — ABNORMAL HIGH (ref 70–99)
Glucose-Capillary: 86 mg/dL (ref 70–99)
Glucose-Capillary: 87 mg/dL (ref 70–99)
Glucose-Capillary: 93 mg/dL (ref 70–99)

## 2020-08-18 LAB — CBC
HCT: 35.2 % — ABNORMAL LOW (ref 36.0–46.0)
Hemoglobin: 10.4 g/dL — ABNORMAL LOW (ref 12.0–15.0)
MCH: 28 pg (ref 26.0–34.0)
MCHC: 29.5 g/dL — ABNORMAL LOW (ref 30.0–36.0)
MCV: 94.9 fL (ref 80.0–100.0)
Platelets: 177 10*3/uL (ref 150–400)
RBC: 3.71 MIL/uL — ABNORMAL LOW (ref 3.87–5.11)
RDW: 15.5 % (ref 11.5–15.5)
WBC: 7.7 10*3/uL (ref 4.0–10.5)
nRBC: 0 % (ref 0.0–0.2)

## 2020-08-18 MED ORDER — APIXABAN 5 MG PO TABS: 5.0000 mg | ORAL_TABLET | Freq: Two times a day (BID) | ORAL | 2 refills | Status: DC

## 2020-08-18 MED ORDER — APIXABAN 5 MG PO TABS: 10.0000 mg | ORAL_TABLET | Freq: Two times a day (BID) | ORAL | 0 refills | Status: DC

## 2020-08-18 MED ORDER — ADULT MULTIVITAMIN W/MINERALS CH: 1.0000 | ORAL_TABLET | Freq: Every day | ORAL | Status: DC

## 2020-08-18 MED ORDER — LEVOTHYROXINE SODIUM 100 MCG PO TABS: 100.0000 ug | ORAL_TABLET | Freq: Every day | ORAL | 2 refills | Status: DC

## 2020-08-18 MED FILL — ELIQUIS 5 MG TABLET: 5 | 30 days supply | Qty: 74 | Fill #0

## 2020-08-18 MED FILL — LEVOTHYROXINE SODIUM 100 MC: 100 | 30 days supply | Qty: 30 | Fill #0

## 2020-08-18 NOTE — Progress Notes (Signed)
Physical Therapy Treatment Patient Details Name: Miranda Cohen MRN: [id number] DOB: July 07, 1946 Today's Date: 08/18/2020    History of Present Illness Pt is 74 y.o. F with PMH of tobacco use who presented with exertional shortness of breath, cough, diarrhea, fatigue since 8/1.  Work-up significant for small PE, hypercarbia, likely COPD exacerbation.  Bipap was initiated, however hypercarbia failed to significantly improve and pt became more lethargic, pt was intubated 9/5, extubated 9/9    PT Comments    Pt admitted with above diagnosis. Pt was able to ambulate in hallway with min assist of 2 with chair follow for safety.  Pt needs assist and states her family can assist her at home.  She states she will borrow equipment as below as well.  Pt currently with functional limitations due to balance and endurance deficits. Pt will benefit from skilled PT to increase their independence and safety with mobility to allow discharge to the venue listed below.    Follow Up Recommendations  Home health PT;Supervision/Assistance - 24 hour (Pt refuses HHPT)     Equipment Recommendations  Rolling walker with 5" wheels;Wheelchair (measurements PT);Wheelchair cushion (measurements PT);3in1 (PT)    Recommendations for Other Services       Precautions / Restrictions Precautions Precautions: Fall Restrictions Weight Bearing Restrictions: No    Mobility  Bed Mobility Overal bed mobility: Needs Assistance Bed Mobility: Supine to Sit     Supine to sit: Min assist     General bed mobility comments: Assist to come to EOB  Transfers Overall transfer level: Needs assistance Equipment used: Rolling walker (2 wheeled) Transfers: Sit to/from Stand Sit to Stand: +2 safety/equipment;+2 physical assistance;Mod assist;From elevated surface;Min assist         General transfer comment: cues for hand placement; Pt needing less assist once bed elevated as she couldnt power up when bed not elevated.    Ambulation/Gait Ambulation/Gait assistance: Min assist;+2 safety/equipment;Min guard Gait Distance (Feet): 120 Feet Assistive device: Rolling walker (2 wheeled) Gait Pattern/deviations: Trunk flexed;Shuffle;Step-to pattern;Wide base of support;Drifts right/left Gait velocity: decreased Gait velocity interpretation: <1.31 ft/sec, indicative of household ambulator General Gait Details: Pt able to ambulate in hallway with cues for RW sequencing and steps as well as proximity to RW. will have assist by son at home.  Had to use the O2 at 6L to keep sats >90%. Nurse aware.  Followed with chair.   Stairs             Wheelchair Mobility    Modified Rankin (Stroke Patients Only)       Balance Overall balance assessment: Needs assistance Sitting-balance support: No upper extremity supported;Feet supported;Single extremity supported Sitting balance-Leahy Scale: Fair Sitting balance - Comments: able to do without UE but fatigues and prefers to use at least 1 UE   Standing balance support: Bilateral upper extremity supported Standing balance-Leahy Scale: Poor Standing balance comment: Pt was able to stand with UE support and external support.                             Cognition Arousal/Alertness: Awake/alert Behavior During Therapy: WFL for tasks assessed/performed Overall Cognitive Status: Impaired/Different from baseline Area of Impairment: Memory;Problem solving;Awareness;Safety/judgement                     Memory: Decreased short-term memory   Safety/Judgement: Decreased awareness of deficits Awareness: Emergent Problem Solving: Requires verbal cues General Comments: Oriented but is demonstrating mild confusion.  Exercises General Exercises - Lower Extremity Ankle Circles/Pumps: AROM;Both;20 reps;Supine Heel Slides: AROM;Right;Left;10 reps;Supine    General Comments        Pertinent Vitals/Pain Pain Assessment: No/denies pain     Home Living                      Prior Function            PT Goals (current goals can now be found in the care plan section) Acute Rehab PT Goals Patient Stated Goal: to go home Progress towards PT goals: Progressing toward goals    Frequency    Min 3X/week      PT Plan Discharge plan needs to be updated    Co-evaluation              AM-PAC PT "6 Clicks" Mobility   Outcome Measure  Help needed turning from your back to your side while in a flat bed without using bedrails?: A Little Help needed moving from lying on your back to sitting on the side of a flat bed without using bedrails?: A Lot Help needed moving to and from a bed to a chair (including a wheelchair)?: A Little Help needed standing up from a chair using your arms (e.g., wheelchair or bedside chair)?: A Little Help needed to walk in hospital room?: A Little Help needed climbing 3-5 steps with a railing? : Total 6 Click Score: 15    End of Session Equipment Utilized During Treatment: Oxygen;Gait belt Activity Tolerance: Patient tolerated treatment well Patient left: with call bell/phone within reach;with chair alarm set;in chair Nurse Communication: Mobility status PT Visit Diagnosis: Other abnormalities of gait and mobility (R26.89);Difficulty in walking, not elsewhere classified (R26.2)     Time: 6659-9357 PT Time Calculation (min) (ACUTE ONLY): 19 min  Charges:  $Gait Training: 8-22 mins                     Carren Blakley W,PT Acute Rehabilitation Services Pager:  [phone]  Office:  [phone]     Berline Lopes 08/18/2020, 9:56 AM

## 2020-08-18 NOTE — Assessment & Plan Note (Signed)
Possible dx, thought secondary to pneumonia likely CAP.

## 2020-08-18 NOTE — Assessment & Plan Note (Signed)
--  no inpt treatment recommended

## 2020-08-18 NOTE — Care Management Important Message (Signed)
Important Message  Patient Details  Name: Miranda Cohen MRN: [id number] Date of Birth: 1946/08/16   Medicare Important Message Given:  Yes     Renie Ora 08/18/2020, 9:16 AM

## 2020-08-18 NOTE — Assessment & Plan Note (Signed)
--  appears stable; follow-up as outpt

## 2020-08-18 NOTE — Discharge Summary (Addendum)
Physician Discharge Summary  Miranda MapleJudith A Cohen WUJ:[id number]:[id number] DOB: 09/11/1946 DOA: 08/05/2020  PCP: Patient, No Pcp Per  Admit date: 08/05/2020 Discharge date: 08/18/2020  Recommendations for Outpatient Follow-up:  See discussion below for multiple recommendations Needs f/u TSH Wean Cohen as tolerated F/u pericardial effusion Followup PAD   Follow-up Information    Miranda Cohen Follow up on 09/06/2020.   Specialty: Cardiology Why: Please arrive 15 minutes early for your 11:45am post-hospital cardiology appointment.  Contact information: [address] [place] [address] [phone]        Miranda Cohen Follow up.   Why: Office will call the patient to schedule an appointment within 2 weeks. [facility] office had no appointments available.  Contact information: [address] [place] [address] [phone]        Miranda Cohen Follow up.   Why: Cohen Contact information: [address] [place] [address] [phone]                Discharge Diagnoses: Principal diagnosis is #1 Principal Problem:   Myxedema coma (HCC) Active Problems:   Acute respiratory failure with hypoxia and hypercapnia (HCC)   Acute pulmonary embolism (HCC)   Pericardial effusion   Chronic lymphocytic thyroiditis   Hypothyroidism   COPD (chronic obstructive pulmonary disease) (HCC)   Coronary artery calcification   AKI (acute kidney injury) (HCC)   PAD (peripheral artery disease) (HCC)   Acute metabolic encephalopathy   Sepsis (HCC)   Aortic atherosclerosis (HCC)   Emphysema lung (HCC)   Discharge Condition: improved  Disposition: home w/ home Cohen Refused CIR, SNF, HH  Diet recommendation: heart healthy  Filed Weights   08/16/20 0411 08/17/20 0014 08/18/20 0026  Weight: 105.7 kg 106.4 kg 106.1 kg    History of present illness/Hospital Course:  74 year old woman no PMH (hadn't seen doctor in 20 years)  presented with acute respiratory failure, found to have PE, pericardial effusion, COPD exacerbation. Was placed on BiPAP but failed to improve and was intubated and admitted to the ICU by PCCM. Required vasopressors. Seen by cardiology for large pericardial effusion. Found to hav emyxedema coma secondary to severe hypothyroidism. Treated with IV antibiotics, successfully extubated.  Treated for PE with heparin infusion and then transition to apixaban.  Echocardiogram showed low posterior pericardial effusion with no evidence of tamponade.  Cardiology thought secondary to myxedema.  Recommended treating hypothyroidism and follow-up in the outpatient setting.  Acute pulmonary embolism (HCC) --Stable, continue apixaban.  Myxedema coma (HCC) --secondary to severe hypothyroidism in the setting of untreated Chronic Lymphocytic Thyroiditis --Continue levothyroxine. Follow-up as an outpatient.  Acute respiratory failure with hypoxia and hypercapnia (HCC) --Required intubation.  Successfully extubated. --Still on high flow nasal cannula but now stable on 6 L.  Wean as tolerated in outpt setting, will d/c on home Cohen  Pericardial effusion --Thought secondary to myxedema.  Cardiology recommended treating with Synthroid.  --no evidence of tamponade --plan repeat limited echo as outpt about 9/21 to monitor pericardial effusion and followup as outpt with cardiology  AKI (acute kidney injury) (HCC) --resolved  COPD (chronic obstructive pulmonary disease) (HCC) --appears stable  PAD (peripheral artery disease) (HCC) -longtime cigarette smoker > might quit -preliminary ABI showing moderate RLE arterial disease and mild LLE arterial disease --f/u as an outpatient  Chronic lymphocytic thyroiditis --treatment as per myxedema, hypothyroidism --f/u as an outpatient  Hypothyroidism --continue levothyroxine  Acute metabolic encephalopathy --resolved, secondary to hypothyroidsm, resp failure.  Sepsis (HCC) Possible dx, thought secondary to pneumonia likely CAP.  Aortic atherosclerosis (HCC) --no inpt treatment recommended  Emphysema lung (HCC) --appears stable; follow-up as outpt  Significant Hospital Events   9/4 Admit to Hospitalists, ICU txfr 9/5 intubated 9/9 extubated  Consults:  PCCM  Procedures:  9/5 ETT 9/5 Right femoral artery cathter> 9/10  Significant Diagnostic Tests:  9/4 CXR>>The lower portion of the left lung and left cardiac border are obscured by dense consolidation or pleural effusion. 9/4 CT chest>>small RLL PE, cardiomegaly and moderate pericardial effusion 9/5 ECHO >> LVEF 70-75%, large pericardial effusion 9/5 LE dopplers >> negative for DVT bilaterally 9/6 CT chest wo contrast >> moderate pericardial effusion, small pleural effusions b/l, stable diffuse enlargement of thyroid gland. 9/7 ABI >> moderate RLE and mild LLE arterial disease.  Micro Data:  9/4 Sars-CoV-2>>negative 9/4 BCx2>> 9/4 MRSA screen>>negative 9/4 repeat covid>>negative 9/5 BCx2>> 9/5 Respiratory culture >> moderate GPC in clusters, few GNR  Antimicrobials:  9/5 Cefepime >> 9/7 9/5 Vancomycin >> 9/5 9/8 Ceftriaxone >> 9/9   Today's assessment: S: feels good, breathing well, no complaints Wants to go home Refuses CIR, SNF, HH O: Vitals:  Vitals:   08/18/20 1154 08/18/20 1340  BP: (!) 124/56   Pulse: (!) 105   Resp: 20   Temp: 97.8 F (36.6 C)   SpO2: 94% 94%    Constitutional:  . Appears calm and comfortable ENMT:  . grossly normal hearing  Respiratory:  . CTA bilaterally, no w/r/r.  . Respiratory effort normal.  Cardiovascular:  . RRR, no Cohen/r/g . 1+ BLE extremity edema   Psychiatric:  . judgement and insight appear normal, understands condition, has capacity . Mental status o Mood, affect appropriate  CBG stable Hgb stable at 10.4  Discharge Instructions  Discharge Instructions    Diet - low sodium heart healthy   Complete  by: As directed    Discharge instructions   Complete by: As directed    Call your physician or seek immediate medical attention for pain, swelling, difficulty breathing, bleeding or worsening of condition.   Increase activity slowly   Complete by: As directed    No wound care   Complete by: As directed      Allergies as of 08/18/2020   No Known Allergies     Medication List    STOP taking these medications   acetaminophen 500 MG tablet Commonly known as: TYLENOL     TAKE these medications   apixaban 5 MG Tabs tablet Commonly known as: ELIQUIS Take 2 tablets (10 mg total) by mouth 2 (two) times daily for 3 days.   apixaban 5 MG Tabs tablet Commonly known as: ELIQUIS Take 1 tablet (5 mg total) by mouth 2 (two) times daily. Start after finishing 10mg  dose for 3 days. Start taking on: August 21, 2020   levothyroxine 100 MCG tablet Commonly known as: SYNTHROID Take 1 tablet (100 mcg total) by mouth daily at 6 (six) AM. Start taking on: August 19, 2020   multivitamin with minerals Tabs tablet Take 1 tablet by mouth daily. Start taking on: August 19, 2020            Durable Medical Equipment  (From admission, onward)         Start     Ordered   08/18/20 1243  For home use only DME Cohen  Once       Comments: Acute respiratory failure with hypoxia and hypercapnia  Question Answer Comment  Length of  Need Lifetime   Mode or (Route) Nasal cannula   Liters per Minute 6   Frequency Continuous (stationary and portable Cohen unit needed)   Cohen conserving device Yes   Cohen delivery system Gas      08/18/20 1242         No Known Allergies  The results of significant diagnostics from this hospitalization (including imaging, microbiology, ancillary and laboratory) are listed below for reference.    Significant Diagnostic Studies: CT CHEST WO CONTRAST  Result Date: 08/07/2020 CLINICAL DATA:  Acute respiratory failure. EXAM: CT CHEST WITHOUT CONTRAST  TECHNIQUE: Multidetector CT imaging of the chest was performed following the standard protocol without IV contrast. COMPARISON:  August 05, 2020. FINDINGS: Cardiovascular: Atherosclerosis of thoracic aorta is noted without aneurysm formation. Moderate size pericardial effusion is noted. Mild coronary artery calcifications are noted. Mediastinum/Nodes: Endotracheal tube is in good position. Nasogastric tube is seen passing through esophagus and into stomach. Stable diffuse enlargement of thyroid gland is noted. No adenopathy is noted. Lungs/Pleura: No pneumothorax is noted. Mild emphysematous disease is noted in the upper lobes bilaterally. Small pleural effusions are noted with adjacent atelectasis or infiltrates of both lower lobes. Upper Abdomen: Cholelithiasis is noted. Musculoskeletal: No chest wall mass or suspicious bone lesions identified. IMPRESSION: 1. Moderate size pericardial effusion is noted. 2. Mild coronary artery calcifications are noted suggesting coronary artery disease. 3. Small pleural effusions are noted with adjacent atelectasis or infiltrates of both lower lobes. 4. Endotracheal and nasogastric tubes are in good position. 5. Cholelithiasis. 6. Stable diffuse enlargement of thyroid gland is noted. 7. Emphysema and aortic atherosclerosis. Aortic Atherosclerosis (ICD10-I70.0) and Emphysema (ICD10-J43.9). Electronically Signed   By: Lupita Raider Cohen.D.   On: 08/07/2020 11:01   CT Angio Chest PE W and/or Wo Contrast  Result Date: 08/05/2020 CLINICAL DATA:  Shortness of breath and marked hypoxia. The patient reports being ill since 07/02/2020 with shortness of breath, cough and diarrhea. EXAM: CT ANGIOGRAPHY CHEST WITH CONTRAST TECHNIQUE: Multidetector CT imaging of the chest was performed using the standard protocol during bolus administration of intravenous contrast. Multiplanar CT image reconstructions and MIPs were obtained to evaluate the vascular anatomy. CONTRAST:  65mL OMNIPAQUE  IOHEXOL 350 MG/ML SOLN COMPARISON:  Portable chest obtained earlier today. FINDINGS: Cardiovascular: Enlarged heart. Moderate-sized pericardial effusion with a maximum thickness of 3.2 cm. Small elongated right lower lobe pulmonary arterial filling defect, best seen on image number 233 series 7. No other pulmonary arterial filling defects are seen. Enlarged central pulmonary arteries. The main pulmonary artery segment measures 3.3 cm in diameter on image number 160 series 7. Atheromatous calcifications, including the coronary arteries and aorta. Mediastinum/Nodes: Diffusely enlarged thyroid gland with no visible nodules in the included portions. No enlarged lymph nodes. Lungs/Pleura: Small bilateral pleural effusions. Bilateral lower lobe atelectasis, greater on the left. There is also posterior left upper lobe atelectasis. Mild bilateral centrilobular and paraseptal bullous changes. Upper Abdomen: Unremarkable. Musculoskeletal: Thoracic spine degenerative changes. Review of the MIP images confirms the above findings. IMPRESSION: 1. Single small right lower lobe pulmonary embolus, not large enough cause right heart strain. 2. Cardiomegaly and moderate-sized pericardial effusion. 3. Small bilateral pleural effusions. 4. Bilateral lower lobe atelectasis, greater on the left. 5. Posterior left upper lobe atelectasis. 6. Enlarged central pulmonary arteries, compatible with pulmonary arterial hypertension. 7. Mild changes of COPD. 8. Thyroid goiter. Recommend elective outpatient thyroid ultrasound (ref: J Am Coll Radiol. 2015 Feb;12(2): 143-50). Critical Value/emergent results were called by telephone at the  time of interpretation on 08/05/2020 at 6:51 pm to provider Army Melia, PA, who verbally acknowledged these results. Aortic Atherosclerosis (ICD10-I70.0) and Emphysema (ICD10-J43.9). Electronically Signed   By: Beckie Salts Cohen.D.   On: 08/05/2020 18:59   DG CHEST PORT 1 VIEW  Result Date: 08/10/2020 CLINICAL DATA:   Respiratory failure. EXAM: PORTABLE CHEST 1 VIEW COMPARISON:  August 06, 2020. FINDINGS: The heart size and mediastinal contours are within normal limits. Endotracheal and nasogastric tubes are unchanged in position. Increased bibasilar interstitial densities are noted concerning for worsening pulmonary edema. No pneumothorax is noted. Small pleural effusions may be present. The visualized skeletal structures are unremarkable. IMPRESSION: Stable support apparatus. Increased bibasilar interstitial densities are noted concerning for worsening pulmonary edema. Electronically Signed   By: Lupita Raider Cohen.D.   On: 08/10/2020 08:18   DG CHEST PORT 1 VIEW  Result Date: 08/06/2020 CLINICAL DATA:  Intubation EXAM: PORTABLE CHEST 1 VIEW COMPARISON:  CTA chest dated 08/05/2020 FINDINGS: Endotracheal tube terminates 15 mm above the carina. Mild left lower lobe opacity, likely atelectasis. Right lung is essentially clear. No pleural effusion or pneumothorax. Cardiomegaly.  Thoracic aortic atherosclerosis. Enteric tube courses into the proximal stomach. IMPRESSION: Endotracheal tube terminates 15 mm above the carina. Mild left lower lobe opacity, likely atelectasis. Electronically Signed   By: Charline Bills Cohen.D.   On: 08/06/2020 04:07   DG Chest Port 1 View  Result Date: 08/05/2020 CLINICAL DATA:  Cough and shortness of breath.  COVID-19 positive. EXAM: PORTABLE CHEST 1 VIEW COMPARISON:  July 13, 2010 FINDINGS: Calcific atherosclerotic disease of the aorta. The lower portion of the left lung and left cardiac border obscured by dense consolidation or pleural effusion. Minimal peribronchial nodularity in the right lower lobe. Osseous structures are without acute abnormality. Soft tissues are grossly normal. IMPRESSION: 1. The lower portion of the left lung and left cardiac border are obscured by dense consolidation or pleural effusion. 2. Minimal peribronchial nodularity in the right lower lobe likely infectious or  inflammatory. Electronically Signed   By: Ted Mcalpine Cohen.D.   On: 08/05/2020 15:58   VAS [place] ABI WITH/WO TBI  Result Date: 08/08/2020 LOWER EXTREMITY DOPPLER STUDY Indications: Decreased pulses and ecchymosis bilateral LE.  Comparison Study: no prior Performing Technologist: Blanch Media RVS  Examination Guidelines: A complete evaluation includes at minimum, Doppler waveform signals and systolic blood pressure reading at the level of bilateral brachial, anterior tibial, and posterior tibial arteries, when vessel segments are accessible. Bilateral testing is considered an integral part of a complete examination. Photoelectric Plethysmograph (PPG) waveforms and toe systolic pressure readings are included as required and additional duplex testing as needed. Limited examinations for reoccurring indications may be performed as noted.  ABI Findings: +---------+------------------+-----+---------+--------------------------+ Right    Rt Pressure (mmHg)IndexWaveform Comment                    +---------+------------------+-----+---------+--------------------------+ Brachial                        triphasicunable to obtain due to iv +---------+------------------+-----+---------+--------------------------+ PTA      104               0.78 triphasic                           +---------+------------------+-----+---------+--------------------------+ DP       94  0.71 biphasic                            +---------+------------------+-----+---------+--------------------------+ Great Toe67                0.50                                     +---------+------------------+-----+---------+--------------------------+ +--------+------------------+-----+---------+-------+ Left    Lt Pressure (mmHg)IndexWaveform Comment +--------+------------------+-----+---------+-------+ [id number]                    triphasic        +--------+------------------+-----+---------+-------+  PTA     113               0.85 triphasic        +--------+------------------+-----+---------+-------+ DP      115               0.86 triphasic        +--------+------------------+-----+---------+-------+ +-------+-----------+-----------+------------+------------+ ABI/TBIToday's ABIToday's TBIPrevious ABIPrevious TBI +-------+-----------+-----------+------------+------------+ Right  0.78       0.50                                +-------+-----------+-----------+------------+------------+ Left   0.86                                           +-------+-----------+-----------+------------+------------+  Summary: Right: Resting right ankle-brachial index indicates moderate right lower extremity arterial disease. The right toe-brachial index is abnormal. Left: Resting left ankle-brachial index indicates mild left lower extremity arterial disease.  *See table(s) above for measurements and observations.  Electronically signed by Waverly Ferrari MD on 08/08/2020 at 4:05:44 PM.   Final    ECHOCARDIOGRAM COMPLETE  Result Date: 08/06/2020    ECHOCARDIOGRAM REPORT   Patient Name:   NATTALIE SANTIESTEBAN Madero Date of Exam: 08/06/2020 Medical Rec #:  [id number]       Height:       65.0 in Accession #:    [id number]      Weight:       150.0 lb Date of Birth:  02/29/1946        BSA:          1.750 Cohen Patient Age:    74 years        BP:           71/41 mmHg Patient Gender: F               HR:           64 bpm. Exam Location:  Inpatient Procedure: 2D Echo, Cardiac Doppler and Color Doppler Indications:    Pulmonary embolus  History:        Patient has no prior history of Echocardiogram examinations.                 Risk Factors:Current Smoker.  Sonographer:    Ross Ludwig RDCS (AE) Referring Phys: [phone] Hillary Bow  Sonographer Comments: Patient is morbidly obese and echo performed with patient supine and on artificial respirator. IMPRESSIONS  1. Left ventricular ejection fraction, by estimation, is 70 to 75%. The  left ventricle has hyperdynamic function. The left ventricle has no regional wall motion abnormalities.  There is severe concentric left ventricular hypertrophy. Left ventricular diastolic parameters are indeterminate.  2. Right ventricular systolic function was not well visualized. The right ventricular size is not well visualized.  3. Large pericardial effusion. The pericardial effusion is posterior to the left ventricle.  4. The mitral valve is grossly normal. Trivial mitral valve regurgitation.  5. The aortic valve is tricuspid. Aortic valve regurgitation is not visualized. No aortic stenosis is present. Conclusion(s)/Recommendation(s): Difficult images, RV not well visualized but appears grossly normal on subcostal images. There is a large pericardial effusion, with largest dimension posterior to LV measured as 2.55 cm. No clear respiratory variation, but patient is on ventilator. No clear RA or RV diastolic collapse seen. No apparent tamponade by echo parameters, but recommend clinical correlation. FINDINGS  Left Ventricle: Left ventricular ejection fraction, by estimation, is 70 to 75%. The left ventricle has hyperdynamic function. The left ventricle has no regional wall motion abnormalities. The left ventricular internal cavity size was small. There is severe concentric left ventricular hypertrophy. Left ventricular diastolic parameters are indeterminate. Right Ventricle: The right ventricular size is not well visualized. Right vetricular wall thickness was not assessed. Right ventricular systolic function was not well visualized. Left Atrium: Left atrial size was normal in size. Right Atrium: Right atrial size was normal in size. Pericardium: A large pericardial effusion is present. The pericardial effusion is posterior to the left ventricle. Mitral Valve: The mitral valve is grossly normal. Trivial mitral valve regurgitation. MV peak gradient, 6.6 mmHg. The mean mitral valve gradient is 2.0 mmHg. Tricuspid  Valve: The tricuspid valve is grossly normal. Tricuspid valve regurgitation is trivial. Aortic Valve: The aortic valve is tricuspid. Aortic valve regurgitation is not visualized. No aortic stenosis is present. Aortic valve mean gradient measures 5.0 mmHg. Aortic valve peak gradient measures 7.1 mmHg. Aortic valve area, by VTI measures 2.89 cm. Pulmonic Valve: The pulmonic valve was grossly normal. Pulmonic valve regurgitation is mild. Aorta: The aortic root and ascending aorta are structurally normal, with no evidence of dilitation. Venous: IVC assessment for right atrial pressure unable to be performed due to mechanical ventilation. IAS/Shunts: No atrial level shunt detected by color flow Doppler.  LEFT VENTRICLE PLAX 2D LVIDd:         3.50 cm  Diastology LVIDs:         2.00 cm  LV e' lateral:   4.28 cm/s LV PW:         1.80 cm  LV E/e' lateral: 22.6 LV IVS:        1.80 cm  LV e' medial:    3.38 cm/s LVOT diam:     1.90 cm  LV E/e' medial:  28.6 LV SV:         96 LV SV Index:   55 LVOT Area:     2.84 cm  RIGHT VENTRICLE            IVC RV Basal diam:  3.10 cm    IVC diam: 1.90 cm RV S prime:     9.23 cm/s TAPSE (Cohen-mode): 1.2 cm LEFT ATRIUM             Index       RIGHT ATRIUM           Index LA diam:        3.30 cm 1.89 cm/Cohen  RA Area:     14.60 cm LA Vol (A2C):   43.0 ml 24.56 ml/Cohen RA Volume:   33.20 ml  18.97  ml/Cohen LA Vol (A4C):   37.0 ml 21.14 ml/Cohen LA Biplane Vol: 41.4 ml 23.65 ml/Cohen  AORTIC VALVE AV Area (Vmax):    2.75 cm AV Area (Vmean):   2.71 cm AV Area (VTI):     2.89 cm AV Vmax:           133.00 cm/s AV Vmean:          101.000 cm/s AV VTI:            0.332 Cohen AV Peak Grad:      7.1 mmHg AV Mean Grad:      5.0 mmHg LVOT Vmax:         129.00 cm/s LVOT Vmean:        96.600 cm/s LVOT VTI:          0.338 Cohen LVOT/AV VTI ratio: 1.02  AORTA Ao Root diam: 3.30 cm MITRAL VALVE MV Area (PHT): 2.29 cm     SHUNTS MV Peak grad:  6.6 mmHg     Systemic VTI:  0.34 Cohen MV Mean grad:  2.0 mmHg     Systemic Diam: 1.90 cm  MV Vmax:       1.28 Cohen/s MV Vmean:      65.9 cm/s MV Decel Time: 331 msec MV E velocity: 96.70 cm/s MV A velocity: 129.00 cm/s MV E/A ratio:  0.75 Jodelle Red MD Electronically signed by Jodelle Red MD Signature Date/Time: 08/06/2020/3:13:56 PM    Final    VAS [place] LOWER EXTREMITY VENOUS (DVT)  Result Date: 08/08/2020  Lower Venous DVTStudy Indications: Pulmonary embolism.  Limitations: Body habitus, line and ventilator. Comparison Study: No prior study on file Performing Technologist: Sherren Kerns RVS  Examination Guidelines: A complete evaluation includes B-mode imaging, spectral Doppler, color Doppler, and power Doppler as needed of all accessible portions of each vessel. Bilateral testing is considered an integral part of a complete examination. Limited examinations for reoccurring indications may be performed as noted. The reflux portion of the exam is performed with the patient in reverse Trendelenburg.  +---------+---------------+---------+-----------+----------+--------------+ RIGHT    CompressibilityPhasicitySpontaneityPropertiesThrombus Aging +---------+---------------+---------+-----------+----------+--------------+ CFV                                                   Not visualized +---------+---------------+---------+-----------+----------+--------------+ SFJ                                                   Not visualized +---------+---------------+---------+-----------+----------+--------------+ FV Prox  Full           Yes      Yes                                 +---------+---------------+---------+-----------+----------+--------------+ FV Mid   Full                                                        +---------+---------------+---------+-----------+----------+--------------+ FV DistalFull                                                        +---------+---------------+---------+-----------+----------+--------------+  PFV                                                    Not visualized +---------+---------------+---------+-----------+----------+--------------+ POP                     Yes      Yes                                 +---------+---------------+---------+-----------+----------+--------------+ PTV      Full                                                        +---------+---------------+---------+-----------+----------+--------------+ PERO     Full                                                        +---------+---------------+---------+-----------+----------+--------------+   +---------+---------------+---------+-----------+----------+--------------+ LEFT     CompressibilityPhasicitySpontaneityPropertiesThrombus Aging +---------+---------------+---------+-----------+----------+--------------+ CFV      Full           Yes      Yes                                 +---------+---------------+---------+-----------+----------+--------------+ SFJ      Full                                                        +---------+---------------+---------+-----------+----------+--------------+ FV Prox  Full                                                        +---------+---------------+---------+-----------+----------+--------------+ FV Mid   Full                                                        +---------+---------------+---------+-----------+----------+--------------+ FV DistalFull                                                        +---------+---------------+---------+-----------+----------+--------------+ PFV      Full                                                        +---------+---------------+---------+-----------+----------+--------------+  POP                     Yes      Yes                                 +---------+---------------+---------+-----------+----------+--------------+ PTV      Full                                                         +---------+---------------+---------+-----------+----------+--------------+ PERO     Full                                                        +---------+---------------+---------+-----------+----------+--------------+     Summary: RIGHT: - There is no evidence of deep vein thrombosis in the lower extremity. However, portions of this examination were limited- see technologist comments above.  LEFT: - There is no evidence of deep vein thrombosis in the lower extremity. However, portions of this examination were limited- see technologist comments above.  *See table(s) above for measurements and observations. Electronically signed by Waverly Ferrari MD on 08/08/2020 at 4:13:12 PM.    Final    ECHOCARDIOGRAM LIMITED  Result Date: 08/08/2020    ECHOCARDIOGRAM LIMITED REPORT   Patient Name:   MARLISS BUTTACAVOLI Bailey Date of Exam: 08/08/2020 Medical Rec #:  [id number]       Height:       65.0 in Accession #:    [id number]      Weight:       227.3 lb Date of Birth:  [birth year]        BSA:          2.089 Cohen Patient Age:    74 years        BP:           113/60 mmHg Patient Gender: F               HR:           51 bpm. Exam Location:  Inpatient Procedure: Limited Echo, Cardiac Doppler and Color Doppler Indications:    Pericardial effusion 423.9 / I31.3  History:        Patient has prior history of Echocardiogram examinations, most                 recent 08/06/2020. Risk Factors:Current Smoker. Acute respiratory                 failure.  Sonographer:    Leta Jungling RDCS Referring Phys: [id number] Tanna Savoy [facility]  Sonographer Comments: Echo performed with patient supine and on artificial respirator. IMPRESSIONS  1. Left ventricular ejection fraction, by estimation, is 60 to 65%. The left ventricle has normal function. The left ventricle has no regional wall motion abnormalities. The average left ventricular global longitudinal strain is -19.3 %. The global longitudinal strain is normal.  2. Right ventricular  systolic function is normal. The right ventricular size is mildly enlarged. Tricuspid regurgitation signal is inadequate for assessing PA pressure.  3. Moderate pericardial effusion. The pericardial effusion is circumferential. The majority  of the effusion is posterior measuring 2.26cm in greatest diameter posterior to the LV.     Anterior to the RV there is a thickened appearing mass within the pericardium. This could be a fat pad but it is thicker than would usually be seen. Recommend cardiac MRI.  4. The mitral valve is normal in structure. No evidence of mitral valve regurgitation. No evidence of mitral stenosis.  5. The aortic valve is normal in structure. Aortic valve regurgitation is not visualized. No aortic stenosis is present.  6. The inferior vena cava is dilated in size with <50% respiratory variability, suggesting right atrial pressure of 15 mmHg. FINDINGS  Left Ventricle: Left ventricular ejection fraction, by estimation, is 60 to 65%. The left ventricle has normal function. The left ventricle has no regional wall motion abnormalities. The average left ventricular global longitudinal strain is -19.3 %. The global longitudinal strain is normal. The left ventricular internal cavity size was normal in size. There is no left ventricular hypertrophy. Right Ventricle: The right ventricular size is mildly enlarged. No increase in right ventricular wall thickness. Right ventricular systolic function is normal. Tricuspid regurgitation signal is inadequate for assessing PA pressure. Left Atrium: Left atrial size was normal in size. Right Atrium: Right atrial size was normal in size. Pericardium: The majority of the effusion is posterior measuring 2.26cm in greatest diameter posterior to the LV. Anterior to the RV there is a thickened appearing mass within the pericardium. This could be a fat pad but it is thicker than would usually be seen. Recommend cardiac MRI. A moderately sized pericardial effusion is  present. The pericardial effusion is circumferential. Mitral Valve: The mitral valve is normal in structure. Normal mobility of the mitral valve leaflets. Mild to moderate mitral annular calcification. No evidence of mitral valve stenosis. Tricuspid Valve: The tricuspid valve is normal in structure. Tricuspid valve regurgitation is not demonstrated. No evidence of tricuspid stenosis. Aortic Valve: The aortic valve is normal in structure. Aortic valve regurgitation is not visualized. No aortic stenosis is present. Pulmonic Valve: The pulmonic valve was normal in structure. Pulmonic valve regurgitation is not visualized. No evidence of pulmonic stenosis. Aorta: The aortic root is normal in size and structure. Venous: The inferior vena cava is dilated in size with less than 50% respiratory variability, suggesting right atrial pressure of 15 mmHg. IAS/Shunts: No atrial level shunt detected by color flow Doppler. LEFT VENTRICLE PLAX 2D LVOT diam:     1.80 cm LV SV:         63       2D Longitudinal Strain LV SV Index:   30       2D Strain GLS (A2C):   -20.2 % LVOT Area:     2.54 cm 2D Strain GLS (A3C):   -20.3 %                         2D Strain GLS (A4C):   -17.5 %                         2D Strain GLS Avg:     -19.3 % RIGHT VENTRICLE TAPSE (Cohen-mode): 1.0 cm RIGHT ATRIUM           Index RA Area:     17.10 cm RA Volume:   46.80 ml  22.41 ml/Cohen  AORTIC VALVE LVOT Vmax:   94.20 cm/s LVOT Vmean:  61.700 cm/s LVOT VTI:  0.247 Cohen  SHUNTS Systemic VTI:  0.25 Cohen Systemic Diam: 1.80 cm Armanda Magic MD Electronically signed by Armanda Magic MD Signature Date/Time: 08/08/2020/11:32:37 AM    Final      Labs: Basic Metabolic Panel: Recent Labs  Lab 08/12/20 0708 08/13/20 0645 08/14/20 1149 08/15/20 0746 08/16/20 0919  NA 141 140 140 140 139  K 3.5 4.0 3.8 4.5 4.3  CL 100 100 98 102 100  CO2 29 32 35* 32 31  GLUCOSE 87 115* 91 128* 100*  BUN 54* 47* 41* 42* 46*  CREATININE 1.28* 1.33* 1.20* 1.17* 1.16*  CALCIUM 8.8*  8.9 9.0 8.5* 9.0  MG  --  2.3 2.5* 2.2 2.3  PHOS  --  4.0 2.9 3.7 3.7   Liver Function Tests: Recent Labs  Lab 08/13/20 0645  AST 21  ALT 33  ALKPHOS 35*  BILITOT 1.4*  PROT 5.6*  ALBUMIN 2.6*   CBC: Recent Labs  Lab 08/14/20 1020 08/15/20 0746 08/16/20 0919 08/17/20 0828 08/18/20 0328  WBC 6.4 8.9 10.2 9.2 7.7  HGB 15.9* 12.4 12.4 10.8* 10.4*  HCT 54.1* 42.7 43.2 38.5 35.2*  MCV 93.9 96.6 97.1 96.7 94.9  PLT 154 129* 156 151 177    Recent Labs    08/05/20 1505  BNP 908.3*     CBG: Recent Labs  Lab 08/18/20 0026 08/18/20 0406 08/18/20 0744 08/18/20 1150 08/18/20 1618  GLUCAP 102* 102* 86 87 93    Principal Problem:   Myxedema coma (HCC) Active Problems:   Acute respiratory failure with hypoxia and hypercapnia (HCC)   Acute pulmonary embolism (HCC)   Pericardial effusion   Chronic lymphocytic thyroiditis   Hypothyroidism   COPD (chronic obstructive pulmonary disease) (HCC)   Coronary artery calcification   AKI (acute kidney injury) (HCC)   PAD (peripheral artery disease) (HCC)   Acute metabolic encephalopathy   Sepsis (HCC)   Aortic atherosclerosis (HCC)   Emphysema lung (HCC)   Time coordinating discharge: 50 minutes  Signed:  Brendia Sacks, MD  [facility]  08/18/2020, 6:34 PM

## 2020-08-18 NOTE — Assessment & Plan Note (Signed)
continue levothyroxine

## 2020-08-18 NOTE — Progress Notes (Signed)
SATURATION QUALIFICATIONS: (This note is used to comply with regulatory documentation for home oxygen)  Patient Saturations on Room Air at Rest = 80%  Patient Saturations on Room Air while Ambulating = 80%  Patient Saturations on 6 Liters of oxygen while Ambulating = 92%  Please briefly explain why patient needs home oxygen:  While in bed/chair, oxygen saturation 94% on 4L however requires 6L oxygen to keep sats >90 when ambulating.

## 2020-08-18 NOTE — TOC Transition Note (Addendum)
Transition of Care [facility]) - CM/SW Discharge Note   Patient Details  Name: Miranda Cohen MRN: [id number] Date of Birth: 1946/01/09  [facility] CM/SW Contact:  Gala Lewandowsky, RN Phone Number: 08/18/2020, 2:04 PM   Clinical Narrative:  Patient was discussed in progression. Plan will be to transition home today. Pt is declining CIR and home health services. Patient states she is from home with her sons- one works and one is at home all day. Patient states she has durable medical equipment (DME) rolling walker and wheelchair in the home. Patient only wants oxygen for the home. However, patient is without part B- she will have to pay private for oxygen. Case Manager discussed during open enrollment for Medicare- patient needs to get Part B and D for Rx drug coverage. Order placed for DME oxygen and call placed to [employer] to discuss with patient regarding cost and delivery. Patient is without primary care provider (PCP); Case Manager called the [facility] on [address] and they cannot see new patient's until Feb. Case Manager then called [facility] in [facility] and the office will call the patient to schedule an appointment within 2 weeks. Information placed on the AVS. Patient will have the number to call as well. Case Manager discussed that the patient may be eligible for Medicaid supplement via the subsidy program. Patient will contact DSS to see if she is eligible for any additional services. Patient states she will have transportation home. No further needs from Case Manager at this time.    1501 08-18-20 Case Manager spoke with patient's daughter she has concerns regarding oxygen and cost. Karoline Caldwell has since called her brothers and they are willing to place a credit card on file for [employer] to deliver the oxygen. Case Manager discussed with [name] that she needs to help patient navigate through DSS to see if she is appropriate for the subsidy program for Medicaid  assistance.   Final next level of care: Home/Self Care Barriers to Discharge: No Barriers Identified   Patient Goals and CMS Choice Patient states their goals for this hospitalization and ongoing recovery are:: to return home- delcined home health services.   Choice offered to / list presented to : NA   Discharge Plan and Services In-house Referral: NA Discharge Planning Services: CM Consult Post Acute Care Choice: NA          DME Arranged: Oxygen DME Agency: [employer] Date DME Agency Contacted: 08/18/20 Time DME Agency Contacted: 1130 Representative spoke with at DME Agency: Tamela Oddi HH Arranged: NA (Declined Home Health Services)    Readmission Risk Interventions No flowsheet data found.

## 2020-08-18 NOTE — Assessment & Plan Note (Signed)
-  longtime cigarette smoker -preliminary ABI showing moderate RLE arterial disease and mild LLE arterial disease

## 2020-08-18 NOTE — Assessment & Plan Note (Signed)
--  treatment as per myxedema, hypothyroidism

## 2020-08-18 NOTE — Progress Notes (Signed)
D/C instructions given and reviewed. Questions answered and encouraged to call with any f/u questions. Tele and IV removed, tolerated well.

## 2020-08-18 NOTE — Assessment & Plan Note (Signed)
--  resolved, secondary to hypothyroidsm, resp failure.

## 2020-09-04 ENCOUNTER — Other Ambulatory Visit (HOSPITAL_COMMUNITY): Payer: Self-pay

## 2020-09-04 ENCOUNTER — Encounter (HOSPITAL_COMMUNITY): Payer: Self-pay | Admitting: Cardiology

## 2020-09-04 ENCOUNTER — Encounter: Payer: Self-pay | Admitting: Cardiology

## 2020-09-04 NOTE — Progress Notes (Unsigned)
Patient ID: Miranda Cohen, female   DOB: [birth year], 74 y.o.   MRN: [id number]  Verified appointment "no show" status with Morrie Sheldon at 1:12pm.

## 2020-09-05 NOTE — Progress Notes (Deleted)
Cardiology Office Note    Date:  09/05/2020   ID:  Miranda Cohen, DOB 11/23/1946, MRN [id number]  PCP:  Patient, No Pcp Per  Cardiologist: Armanda Magic, MD EPS: None  No chief complaint on file.   History of Present Illness:  Miranda Cohen is a 74 y.o. female ***  Patient discharged from the hospital 08/18/2020 after admission with acute respiratory failure, pulmonary embolus, pericardial effusion, COPD exacerbation requiring intubation and pressors, myxedema coma secondary to severe hypothyroidism.  Echo showed low posterior pericardial effusion with no evidence of tamponade cardiology felt it was secondary to myxedema.  Recommended treating hypothyroidism and following up as an outpatient.  Patient did not show up for echo 09/04/2020.  Past Medical History:  Diagnosis Date  . Aortic atherosclerosis (HCC) 08/18/2020  . Chronic lymphocytic thyroiditis 08/18/2020  . Emphysema lung (HCC) 08/18/2020    No past surgical history on file.  Current Medications: No outpatient medications have been marked as taking for the 09/06/20 encounter (Appointment) with Dyann Kief, PA-C.     Allergies:   Patient has no known allergies.   Social History   Socioeconomic History  . Marital status: Divorced    Spouse name: Not on file  . Number of children: Not on file  . Years of education: Not on file  . Highest education level: Not on file  Occupational History  . Not on file  Tobacco Use  . Smoking status: Current Every Day Smoker    Packs/day: 1.50    Types: Cigarettes  Vaping Use  . Vaping Use: Never used  Substance and Sexual Activity  . Alcohol use: Never  . Drug use: Never  . Sexual activity: Not on file  Other Topics Concern  . Not on file  Social History Narrative  . Not on file   Social Determinants of Health   Financial Resource Strain:   . Difficulty of Paying Living Expenses: Not on file  Food Insecurity:   . Worried About Programme researcher, broadcasting/film/video in the Last  Year: Not on file  . Ran Out of Food in the Last Year: Not on file  Transportation Needs:   . Lack of Transportation (Medical): Not on file  . Lack of Transportation (Non-Medical): Not on file  Physical Activity:   . Days of Exercise per Week: Not on file  . Minutes of Exercise per Session: Not on file  Stress:   . Feeling of Stress : Not on file  Social Connections:   . Frequency of Communication with Friends and Family: Not on file  . Frequency of Social Gatherings with Friends and Family: Not on file  . Attends Religious Services: Not on file  . Active Member of Clubs or Organizations: Not on file  . Attends Banker Meetings: Not on file  . Marital Status: Not on file     Family History:  The patient's ***family history is not on file.   ROS:   Please see the history of present illness.    ROS All other systems reviewed and are negative.   PHYSICAL EXAM:   VS:  There were no vitals taken for this visit.  Physical Exam  GEN: Well nourished, well developed, in no acute distress  HEENT: normal  Neck: no JVD, carotid bruits, or masses Cardiac:RRR; no murmurs, rubs, or gallops  Respiratory:  clear to auscultation bilaterally, normal work of breathing GI: soft, nontender, nondistended, + BS Ext: without cyanosis, clubbing, or edema, Good  distal pulses bilaterally MS: no deformity or atrophy  Skin: warm and dry, no rash Neuro:  Alert and Oriented x 3, Strength and sensation are intact Psych: euthymic mood, full affect  Wt Readings from Last 3 Encounters:  08/18/20 233 lb 14.5 oz (106.1 kg)  06/29/20 150 lb (68 kg)      Studies/Labs Reviewed:   EKG:  EKG is*** ordered today.  The ekg ordered today demonstrates ***  Recent Labs: 08/05/2020: B Natriuretic Peptide 908.3 08/13/2020: ALT 33; TSH 22.200 08/16/2020: BUN 46; Creatinine, Ser 1.16; Magnesium 2.3; Potassium 4.3; Sodium 139 08/18/2020: Hemoglobin 10.4; Platelets 177   Lipid Panel    Component Value  Date/Time   TRIG 146 08/11/2020 0416    Additional studies/ records that were reviewed today include:  Echo 08/06/20:  1. Left ventricular ejection fraction, by estimation, is 70 to 75%. The  left ventricle has hyperdynamic function. The left ventricle has no  regional wall motion abnormalities. There is severe concentric left  ventricular hypertrophy. Left ventricular  diastolic parameters are indeterminate.   2. Right ventricular systolic function was not well visualized. The right  ventricular size is not well visualized.   3. Large pericardial effusion. The pericardial effusion is posterior to  the left ventricle.   4. The mitral valve is grossly normal. Trivial mitral valve  regurgitation.   5. The aortic valve is tricuspid. Aortic valve regurgitation is not  visualized. No aortic stenosis is present.   Conclusion(s)/Recommendation(s): Difficult images, RV not well visualized  but appears grossly normal on subcostal images. There is a large  pericardial effusion, with largest dimension posterior to LV measured as  2.55 cm. No clear respiratory variation,  but patient is on ventilator. No clear RA or RV diastolic collapse seen.  No apparent tamponade by echo parameters, but recommend clinical  correlation.      ASSESSMENT:    No diagnosis found.   PLAN:  In order of problems listed above:  Pericardial effusion: Large posterior effusion on echo, no evidence of tamponade.  Suspect secondary to hypothyroidism, TSH 63.  Repeat Echo 9/7 showed stable effusion. -Would expect effusion to improve with treatment of hypothyroidism.  Effusion appears stable on repeat echo, no evidence of tamponade.  Would plan repeat limited echo in 2 weeks to monitor. Would repeat sooner if patient develops hypotension or any clinical concern for tamponade.   ?Cardiac mass: echo 9/7 read as concerning for mass anterior to RV.  I think this is a prominent fat pad; reviewed on recent CT chest and  Hounsfield units consistent with fat.  No further workup recommended.    PE: small RLL PE, on heparin gtt       Medication Adjustments/Labs and Tests Ordered: Current medicines are reviewed at length with the patient today.  Concerns regarding medicines are outlined above.  Medication changes, Labs and Tests ordered today are listed in the Patient Instructions below. There are no Patient Instructions on file for this visit.   Signed, Jacolyn Reedy, PA-C  09/05/2020 3:28 PM    [facility] [address] [place], [place], [place]  [address] Phone: [phone]; Fax: [phone]

## 2020-09-06 ENCOUNTER — Ambulatory Visit: Payer: Self-pay | Admitting: Physician Assistant

## 2020-09-14 ENCOUNTER — Telehealth (HOSPITAL_COMMUNITY): Payer: Self-pay | Admitting: Cardiology

## 2020-09-14 NOTE — Telephone Encounter (Signed)
Just an FYI. We have made several attempts to contact this patient including sending a letter to schedule or reschedule their echocardiogram. We will be removing the patient from the echo WQ.   09/04/2020 Pt No Showed / MAILED LETTER [facility]      Thank you

## 2021-06-26 IMAGING — DX DG CHEST 1V PORT
1 series · 1 of 1 positions shown · non-contrast
Comparison: August 06, 2020.

CLINICAL DATA: Respiratory failure.

EXAM:
PORTABLE CHEST 1 VIEW

[chest ap]
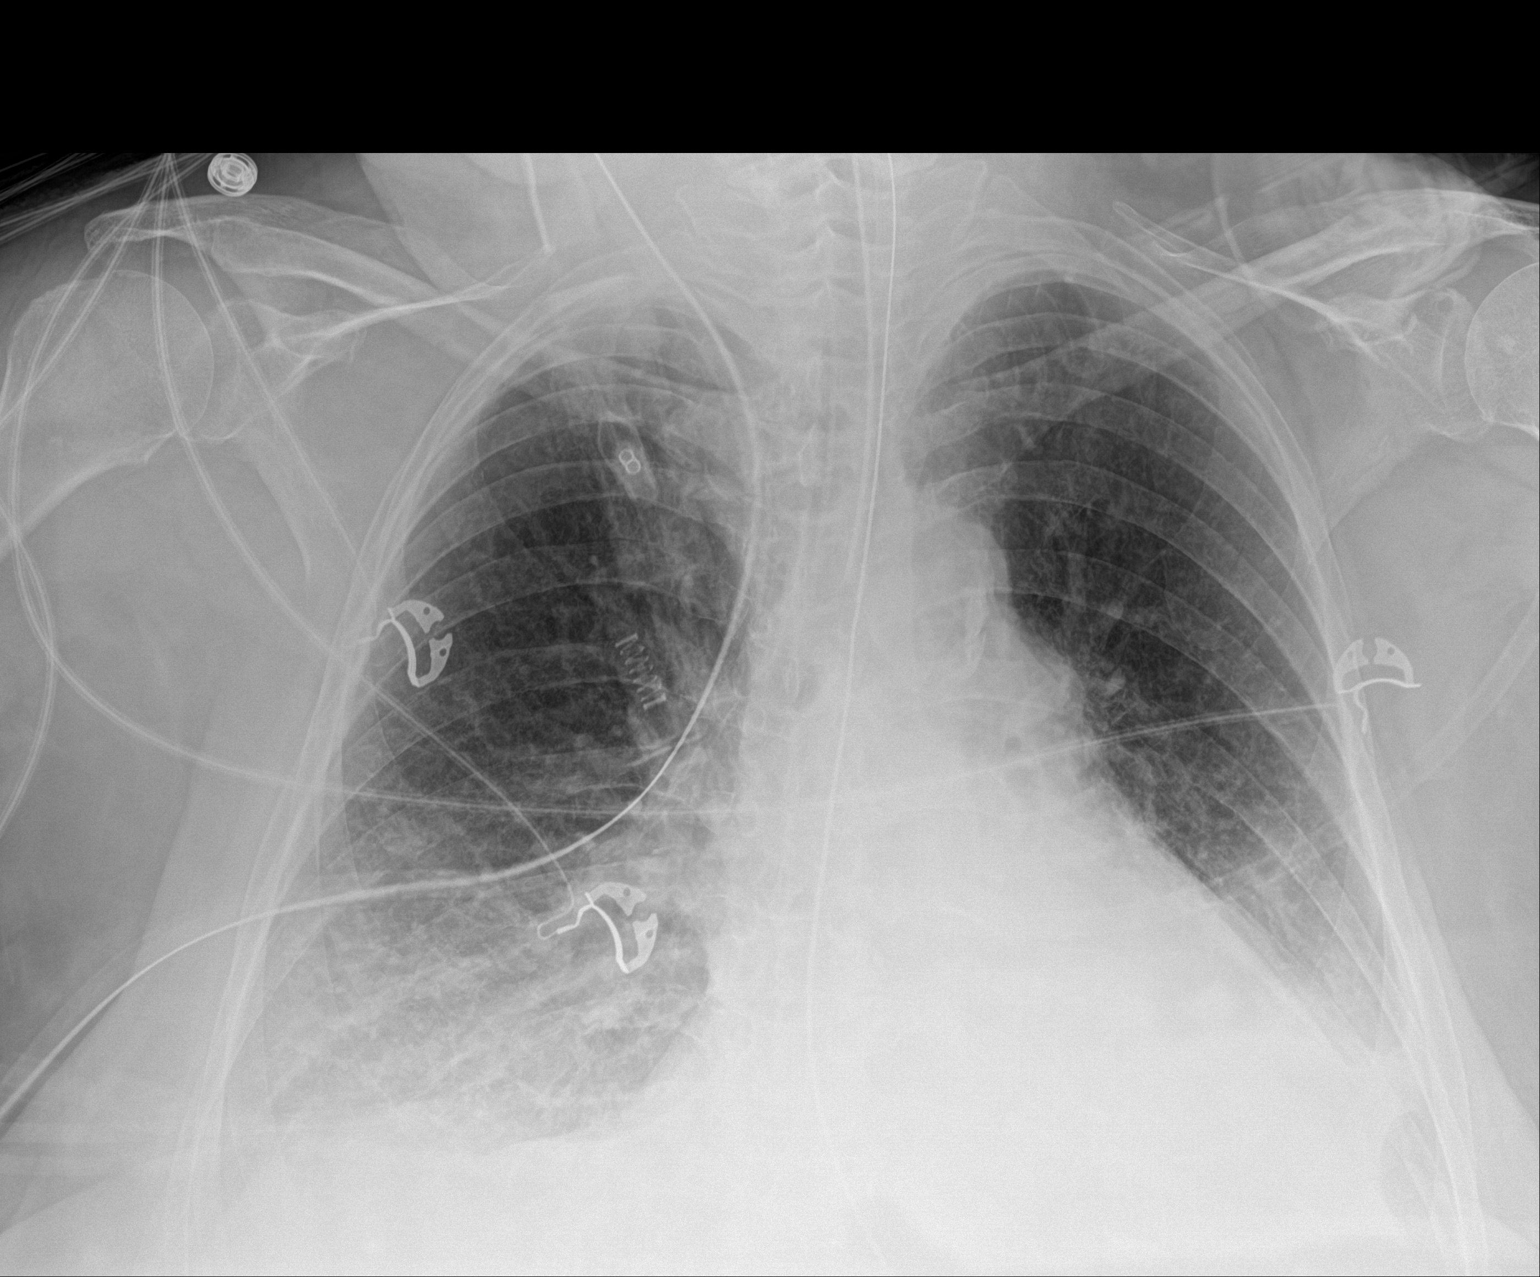

[1 of 1 positions shown; findings below may reference images not displayed]

FINDINGS: The heart size and mediastinal contours are within normal limits.
Endotracheal and nasogastric tubes are unchanged in position.
Increased bibasilar interstitial densities are noted concerning for
worsening pulmonary edema. No pneumothorax is noted. Small pleural
effusions may be present. The visualized skeletal structures are
unremarkable.
IMPRESSION: Stable support apparatus. Increased bibasilar interstitial densities
are noted concerning for worsening pulmonary edema.

## 2021-09-04 ENCOUNTER — Other Ambulatory Visit: Payer: Self-pay | Admitting: Internal Medicine

## 2021-09-04 DIAGNOSIS — G8929 Other chronic pain: Secondary | ICD-10-CM

## 2021-09-14 ENCOUNTER — Ambulatory Visit: Admission: RE | Admit: 2021-09-14 | Payer: Medicare Other | Source: Ambulatory Visit

## 2021-09-20 ENCOUNTER — Ambulatory Visit: Admission: RE | Admit: 2021-09-20 | Payer: Medicare Other | Source: Ambulatory Visit

## 2021-10-08 ENCOUNTER — Ambulatory Visit: Payer: Medicare Other

## 2021-12-07 ENCOUNTER — Other Ambulatory Visit: Payer: Self-pay | Admitting: Internal Medicine

## 2021-12-07 DIAGNOSIS — I2782 Chronic pulmonary embolism: Secondary | ICD-10-CM

## 2021-12-07 DIAGNOSIS — E039 Hypothyroidism, unspecified: Secondary | ICD-10-CM

## 2021-12-27 ENCOUNTER — Ambulatory Visit
Admission: RE | Admit: 2021-12-27 | Discharge: 2021-12-27 | Disposition: A | Discharge status: Home / Self Care | Payer: Medicare Other | Source: Ambulatory Visit | Attending: Internal Medicine | Admitting: Internal Medicine

## 2021-12-27 ENCOUNTER — Other Ambulatory Visit: Payer: Self-pay

## 2021-12-27 DIAGNOSIS — I2782 Chronic pulmonary embolism: Secondary | ICD-10-CM | POA: Diagnosis present

## 2021-12-27 DIAGNOSIS — E039 Hypothyroidism, unspecified: Secondary | ICD-10-CM | POA: Insufficient documentation

## 2021-12-27 LAB — POCT I-STAT CREATININE: Creatinine, Ser: 1.6 mg/dL — ABNORMAL HIGH (ref 0.44–1.00)

## 2021-12-27 MED ORDER — IOHEXOL 350 MG/ML SOLN
80.0000 mL | Freq: Once | INTRAVENOUS | Status: AC | PRN
  Administered 2021-12-27: 60 mL via INTRAVENOUS

## 2022-03-22 ENCOUNTER — Other Ambulatory Visit: Payer: Self-pay | Admitting: Internal Medicine

## 2022-03-22 ENCOUNTER — Ambulatory Visit
Admission: RE | Admit: 2022-03-22 | Discharge: 2022-03-22 | Disposition: A | Discharge status: Home / Self Care | Payer: Medicare Other | Source: Ambulatory Visit | Attending: Internal Medicine | Admitting: Internal Medicine

## 2022-03-22 DIAGNOSIS — M79606 Pain in leg, unspecified: Secondary | ICD-10-CM | POA: Insufficient documentation

## 2022-03-22 DIAGNOSIS — M7989 Other specified soft tissue disorders: Secondary | ICD-10-CM

## 2022-03-30 ENCOUNTER — Ambulatory Visit
Admission: RE | Admit: 2022-03-30 | Discharge: 2022-03-30 | Disposition: A | Discharge status: Home / Self Care | Payer: Medicare Other | Source: Ambulatory Visit | Attending: Internal Medicine | Admitting: Internal Medicine

## 2022-03-30 DIAGNOSIS — G8929 Other chronic pain: Secondary | ICD-10-CM | POA: Diagnosis present

## 2022-03-30 DIAGNOSIS — M5442 Lumbago with sciatica, left side: Secondary | ICD-10-CM | POA: Insufficient documentation

## 2022-04-08 ENCOUNTER — Ambulatory Visit: Payer: Self-pay | Admitting: Surgery

## 2022-04-08 NOTE — H&P (Signed)
Subjective: ? ?CC: Epidermal cyst [L72.0] ? ?HPI: ? Miranda Cohen is a 76 y.o. female who was referred by Enid Baas, MD for evaluation of above. First noted a few years ago.  Recently growing in size, but asymptomatic otherwise. ?  ?Past Medical History:  has a past medical history of Emphysema lung (CMS-HCC), Emphysema of lung (CMS-HCC), Hyperthyroidism, and Pulmonary embolism (CMS-HCC). ? ?Past Surgical History:  has a past surgical history that includes Tubal ligation. ? ?Family History: family history includes No Known Problems in her brother, father, mother, and sister. ? ?Social History:  reports that she quit smoking about 16 months ago. Her smoking use included cigarettes. She has a 116.00 pack-year smoking history. She has never used smokeless tobacco. She reports that she does not currently use alcohol. She reports that she does not use drugs. ? ?Current Medications: has a current medication list which includes the following prescription(s): celecoxib, diclofenac, dulera, furosemide, levothyroxine, meclizine, and multivitamin. ? ?Allergies:  ?No Known Allergies ? ?ROS:  ?A 15 point review of systems was performed and pertinent positives and negatives noted in HPI ?  ?Objective: ?  ? ?BP 135/79   Pulse 82   Ht 165.1 cm (5\' 5" ) Comment: Per chart  Wt 91.2 kg (201 lb) Comment: Per chart  BMI 33.45 kg/m?  ? ?Constitutional :  No distress, cooperative, alert ?Lymphatics/Throat:  Supple with no lymphadenopathy ?Respiratory:  Clear to auscultation bilaterally ?Cardiovascular:  Regular rate and rhythm ?Gastrointestinal: Soft, non-tender, non-distended, no organomegaly. ?Musculoskeletal: Steady gait and movement ?Skin: Cool and moist, large, soft, mobile, superficial lump with blackhead at apex consistent with large epidermal cyst, over left shoulder area. ?Psychiatric: Normal affect, non-agitated, not confused ?  ?  ?  ?LABS:  ?n/a  ? ?RADS: ?n/a ? ?Assessment: ? ?   ?Epidermal cyst  [L72.0] ? ?Plan: ? ?  ?1. Epidermal cyst [L72.0] Discussed surgical excision.  Alternatives include continued observation.  Benefits include possible symptom relief, pathologic evaluation, improved cosmesis. ?Discussed the risk of surgery including recurrence, chronic pain, post-op infxn, poor cosmesis, poor/delayed wound healing, and possible re-operation to address said risks. The risks of general anesthetic, if used, includes MI, CVA, sudden death or even reaction to anesthetic medications also discussed.  ?Typical post-op recovery time of 3-5 days with possible activity restrictions were also discussed. ? ?The patient verbalized understanding and all questions were answered to the patient's satisfaction. ? ?2. Patient has elected to proceed with surgical treatment. Procedure will be scheduled. In OR due to size.  Right side down. ? ?labs/images/medications/previous chart entries reviewed personally and relevant changes/updates noted above. ? ? ?

## 2022-04-18 ENCOUNTER — Encounter: Payer: Self-pay | Admitting: Urgent Care

## 2022-04-18 ENCOUNTER — Encounter
Admission: RE | Admit: 2022-04-18 | Discharge: 2022-04-18 | Disposition: A | Discharge status: Home / Self Care | Payer: Medicare Other | Source: Ambulatory Visit | Attending: Surgery | Admitting: Surgery

## 2022-04-18 DIAGNOSIS — Z01818 Encounter for other preprocedural examination: Secondary | ICD-10-CM

## 2022-04-18 DIAGNOSIS — Z79899 Other long term (current) drug therapy: Secondary | ICD-10-CM

## 2022-04-18 DIAGNOSIS — I700 Atherosclerosis of aorta: Secondary | ICD-10-CM

## 2022-04-18 DIAGNOSIS — J439 Emphysema, unspecified: Secondary | ICD-10-CM

## 2022-04-18 DIAGNOSIS — J449 Chronic obstructive pulmonary disease, unspecified: Secondary | ICD-10-CM

## 2022-04-18 DIAGNOSIS — I2510 Atherosclerotic heart disease of native coronary artery without angina pectoris: Secondary | ICD-10-CM

## 2022-04-18 HISTORY — DX: Hypothyroidism, unspecified: E03.9

## 2022-04-18 HISTORY — DX: Unspecified osteoarthritis, unspecified site: M19.90

## 2022-04-18 HISTORY — DX: Peripheral vascular disease, unspecified: I73.9

## 2022-04-18 HISTORY — DX: Metabolic encephalopathy: G93.41

## 2022-04-18 HISTORY — DX: Myxedema coma: E03.5

## 2022-04-18 HISTORY — DX: Sepsis, unspecified organism: A41.9

## 2022-04-18 HISTORY — DX: Other pericardial effusion (noninflammatory): I31.39

## 2022-04-18 HISTORY — DX: Dyspnea, unspecified: R06.00

## 2022-04-18 HISTORY — DX: Atherosclerotic heart disease of native coronary artery without angina pectoris: I25.10

## 2022-04-18 NOTE — Patient Instructions (Signed)
Your procedure is scheduled on:04-25-22 Thursday Report to the Registration Desk on the 1st floor of the [facility].Then proceed to the 2nd floor surgery desk To find out your arrival time, please call [phone] between 1PM - 3PM on:04-24-22 Wednesday If your arrival time is 6:00 am, do not arrive prior to that time as the [facility] entrance doors do not open until 6:00 am.  REMEMBER: Instructions that are not followed completely may result in serious medical risk, up to and including death; or upon the discretion of your surgeon and anesthesiologist your surgery may need to be rescheduled.  Do not eat food after midnight the night before surgery.  No gum chewing, lozengers or hard candies.  You may however, drink CLEAR liquids up to 2 hours before you are scheduled to arrive for your surgery. Do not drink anything within 2 hours of your scheduled arrival time.  Clear liquids include: - water  - apple juice without pulp - gatorade (not RED colors) - black coffee or tea (Do NOT add milk or creamers to the coffee or tea) Do NOT drink anything that is not on this list.  TAKE THESE MEDICATIONS THE MORNING OF SURGERY WITH A SIP OF WATER: -gabapentin (NEURONTIN)  -levothyroxine (SYNTHROID)   Use your mometasone-formoterol (DULERA) Inhaler the day of surgery  One week prior to surgery: Stop Anti-inflammatories (NSAIDS) such as Advil, Aleve, Ibuprofen, Motrin, Naproxen, Naprosyn and Aspirin based products such as Excedrin, Goodys Powder, BC Powder.You may however, continue to take Tylenol if needed for pain up until the day of surgery. Stop ANY OVER THE COUNTER supplements/vitamins NOW (04-18-22) until after surgery (Vitamin D3, Vitamin B-12, and Multivitamin)  No Alcohol for 24 hours before or after surgery.  No Smoking including e-cigarettes for 24 hours prior to surgery.  No chewable tobacco products for at least 6 hours prior to surgery.  No nicotine patches on the day of  surgery.  Do not use any "recreational" drugs for at least a week prior to your surgery.  Please be advised that the combination of cocaine and anesthesia may have negative outcomes, up to and including death. If you test positive for cocaine, your surgery will be cancelled.  On the morning of surgery brush your teeth with toothpaste and water, you may rinse your mouth with mouthwash if you wish. Do not swallow any toothpaste or mouthwash.  Use CHG Soap as directed on instruction sheet.  Do not wear jewelry, make-up, hairpins, clips or nail polish.  Do not wear lotions, powders, or perfumes.   Do not shave body from the neck down 48 hours prior to surgery just in case you cut yourself which could leave a site for infection.  Also, freshly shaved skin may become irritated if using the CHG soap.  Contact lenses, hearing aids and dentures may not be worn into surgery.  Do not bring valuables to the hospital. [facility] is not responsible for any missing/lost belongings or valuables.   Notify your doctor if there is any change in your medical condition (cold, fever, infection).  Wear comfortable clothing (specific to your surgery type) to the hospital.  After surgery, you can help prevent lung complications by doing breathing exercises.  Take deep breaths and cough every 1-2 hours. Your doctor may order a device called an Incentive Spirometer to help you take deep breaths. When coughing or sneezing, hold a pillow firmly against your incision with both hands. This is called "splinting." Doing this helps protect your incision.  It also decreases belly discomfort.  If you are being admitted to the hospital overnight, leave your suitcase in the car. After surgery it may be brought to your room.  If you are being discharged the day of surgery, you will not be allowed to drive home. You will need a responsible adult (18 years or older) to drive you home and stay with you that night.   If  you are taking public transportation, you will need to have a responsible adult (18 years or older) with you. Please confirm with your physician that it is acceptable to use public transportation.   Please call the Pre-admissions Testing Dept. at [phone] if you have any questions about these instructions.  Surgery Visitation Policy:  Patients undergoing a surgery or procedure may have two family members or support persons with them as long as the person is not COVID-19 positive or experiencing its symptoms.

## 2022-04-22 ENCOUNTER — Inpatient Hospital Stay: Admission: RE | Admit: 2022-04-22 | Payer: Medicare Other | Source: Ambulatory Visit

## 2022-04-23 ENCOUNTER — Encounter
Admission: RE | Admit: 2022-04-23 | Discharge: 2022-04-23 | Disposition: A | Discharge status: Home / Self Care | Payer: Medicare Other | Source: Ambulatory Visit | Attending: Surgery | Admitting: Surgery

## 2022-04-23 DIAGNOSIS — I2584 Coronary atherosclerosis due to calcified coronary lesion: Secondary | ICD-10-CM | POA: Insufficient documentation

## 2022-04-23 DIAGNOSIS — I2510 Atherosclerotic heart disease of native coronary artery without angina pectoris: Secondary | ICD-10-CM | POA: Insufficient documentation

## 2022-04-23 DIAGNOSIS — Z01818 Encounter for other preprocedural examination: Secondary | ICD-10-CM | POA: Diagnosis present

## 2022-04-23 DIAGNOSIS — Z79899 Other long term (current) drug therapy: Secondary | ICD-10-CM | POA: Diagnosis not present

## 2022-04-23 DIAGNOSIS — I700 Atherosclerosis of aorta: Secondary | ICD-10-CM | POA: Insufficient documentation

## 2022-04-23 DIAGNOSIS — J439 Emphysema, unspecified: Secondary | ICD-10-CM | POA: Diagnosis not present

## 2022-04-23 DIAGNOSIS — J449 Chronic obstructive pulmonary disease, unspecified: Secondary | ICD-10-CM

## 2022-04-23 LAB — CBC
HCT: 42.5 % (ref 36.0–46.0)
Hemoglobin: 12.9 g/dL (ref 12.0–15.0)
MCH: 30.1 pg (ref 26.0–34.0)
MCHC: 30.4 g/dL (ref 30.0–36.0)
MCV: 99.3 fL (ref 80.0–100.0)
Platelets: 212 10*3/uL (ref 150–400)
RBC: 4.28 MIL/uL (ref 3.87–5.11)
RDW: 13.4 % (ref 11.5–15.5)
WBC: 6.5 10*3/uL (ref 4.0–10.5)
nRBC: 0 % (ref 0.0–0.2)

## 2022-04-23 LAB — POTASSIUM: Potassium: 5.4 mmol/L — ABNORMAL HIGH (ref 3.5–5.1)

## 2022-04-24 MED ORDER — ORAL CARE MOUTH RINSE: 15.0000 mL | Freq: Once | OROMUCOSAL | Status: DC

## 2022-04-24 MED ORDER — FAMOTIDINE 20 MG PO TABS: 20.0000 mg | ORAL_TABLET | Freq: Once | ORAL | Status: DC

## 2022-04-24 MED ORDER — LACTATED RINGERS IV SOLN: INTRAVENOUS | Status: DC

## 2022-04-24 MED ORDER — CHLORHEXIDINE GLUCONATE CLOTH 2 % EX PADS: 6.0000 | MEDICATED_PAD | Freq: Once | CUTANEOUS | Status: DC

## 2022-04-24 MED ORDER — CHLORHEXIDINE GLUCONATE 0.12 % MT SOLN
15.0000 mL | Freq: Once | OROMUCOSAL | Status: DC
Start: 2022-04-24 — End: 2022-04-25

## 2022-04-24 MED ORDER — CEFAZOLIN SODIUM-DEXTROSE 2-4 GM/100ML-% IV SOLN: 2.0000 g | INTRAVENOUS | Status: DC

## 2022-04-25 ENCOUNTER — Ambulatory Visit: Admission: RE | Admit: 2022-04-25 | Payer: Medicare Other | Source: Home / Self Care | Admitting: Surgery

## 2022-04-25 ENCOUNTER — Encounter: Admission: RE | Payer: Self-pay | Source: Home / Self Care

## 2022-04-25 SURGERY — EXCISION, KELOID
Anesthesia: General | Site: Shoulder | Laterality: Left

## 2022-04-26 ENCOUNTER — Other Ambulatory Visit: Payer: Self-pay | Admitting: Internal Medicine

## 2022-04-26 ENCOUNTER — Ambulatory Visit
Admission: RE | Admit: 2022-04-26 | Discharge: 2022-04-26 | Disposition: A | Discharge status: Home / Self Care | Payer: Medicare Other | Source: Ambulatory Visit | Attending: Internal Medicine | Admitting: Internal Medicine

## 2022-04-26 DIAGNOSIS — R0602 Shortness of breath: Secondary | ICD-10-CM | POA: Insufficient documentation

## 2022-04-26 DIAGNOSIS — R0902 Hypoxemia: Secondary | ICD-10-CM | POA: Diagnosis present

## 2022-04-26 LAB — POCT I-STAT CREATININE: Creatinine, Ser: 1.7 mg/dL — ABNORMAL HIGH (ref 0.44–1.00)

## 2022-04-26 MED ORDER — IOHEXOL 350 MG/ML SOLN
60.0000 mL | Freq: Once | INTRAVENOUS | Status: AC | PRN
  Administered 2022-04-26: 60 mL via INTRAVENOUS

## 2022-08-14 NOTE — Discharge Instructions (Signed)

## 2022-08-15 ENCOUNTER — Encounter: Payer: Self-pay | Admitting: Ophthalmology

## 2022-08-19 ENCOUNTER — Ambulatory Visit: Payer: Medicare Other | Admitting: Anesthesiology

## 2022-08-19 ENCOUNTER — Encounter: Payer: Self-pay | Admitting: Ophthalmology

## 2022-08-19 ENCOUNTER — Other Ambulatory Visit: Payer: Self-pay

## 2022-08-19 ENCOUNTER — Ambulatory Visit
Admission: RE | Admit: 2022-08-19 | Discharge: 2022-08-19 | Disposition: A | Discharge status: Home / Self Care | Payer: Medicare Other | Attending: Ophthalmology | Admitting: Ophthalmology

## 2022-08-19 ENCOUNTER — Encounter
Admission: RE | Disposition: A | Discharge status: Home / Self Care | Payer: Self-pay | Source: Home / Self Care | Attending: Ophthalmology

## 2022-08-19 ENCOUNTER — Ambulatory Visit (AMBULATORY_SURGERY_CENTER): Payer: Medicare Other | Admitting: Anesthesiology

## 2022-08-19 DIAGNOSIS — E039 Hypothyroidism, unspecified: Secondary | ICD-10-CM | POA: Insufficient documentation

## 2022-08-19 DIAGNOSIS — M1990 Unspecified osteoarthritis, unspecified site: Secondary | ICD-10-CM | POA: Insufficient documentation

## 2022-08-19 DIAGNOSIS — H2511 Age-related nuclear cataract, right eye: Secondary | ICD-10-CM

## 2022-08-19 DIAGNOSIS — Z86711 Personal history of pulmonary embolism: Secondary | ICD-10-CM | POA: Insufficient documentation

## 2022-08-19 DIAGNOSIS — I2510 Atherosclerotic heart disease of native coronary artery without angina pectoris: Secondary | ICD-10-CM | POA: Diagnosis not present

## 2022-08-19 DIAGNOSIS — J449 Chronic obstructive pulmonary disease, unspecified: Secondary | ICD-10-CM

## 2022-08-19 DIAGNOSIS — F17210 Nicotine dependence, cigarettes, uncomplicated: Secondary | ICD-10-CM | POA: Insufficient documentation

## 2022-08-19 HISTORY — PX: CATARACT EXTRACTION W/PHACO: SHX586

## 2022-08-19 SURGERY — PHACOEMULSIFICATION, CATARACT, WITH IOL INSERTION
Anesthesia: Monitor Anesthesia Care | Site: Eye | Laterality: Right

## 2022-08-19 MED ORDER — TETRACAINE HCL 0.5 % OP SOLN
1.0000 [drp] | OPHTHALMIC | Status: DC | PRN
  Administered 2022-08-19 (×3): 1 [drp] via OPHTHALMIC

## 2022-08-19 MED ORDER — ACETAMINOPHEN 500 MG PO TABS: 1000.0000 mg | ORAL_TABLET | Freq: Once | ORAL | Status: DC | PRN

## 2022-08-19 MED ORDER — FENTANYL CITRATE (PF) 100 MCG/2ML IJ SOLN
INTRAMUSCULAR | Status: DC | PRN
  Administered 2022-08-19: 50 ug via INTRAVENOUS

## 2022-08-19 MED ORDER — LIDOCAINE HCL (PF) 2 % IJ SOLN
INTRAOCULAR | Status: DC | PRN
  Administered 2022-08-19: 1 mL via INTRAOCULAR

## 2022-08-19 MED ORDER — SIGHTPATH DOSE#1 BSS IO SOLN
INTRAOCULAR | Status: DC | PRN
  Administered 2022-08-19: 96 mL via OPHTHALMIC

## 2022-08-19 MED ORDER — MOXIFLOXACIN HCL 0.5 % OP SOLN
OPHTHALMIC | Status: DC | PRN
  Administered 2022-08-19: 0.2 mL via OPHTHALMIC

## 2022-08-19 MED ORDER — ACETAMINOPHEN 160 MG/5ML PO SOLN: 975.0000 mg | Freq: Once | ORAL | Status: DC | PRN

## 2022-08-19 MED ORDER — LACTATED RINGERS IV SOLN: INTRAVENOUS | Status: DC

## 2022-08-19 MED ORDER — ARMC OPHTHALMIC DILATING DROPS
1.0000 | OPHTHALMIC | Status: DC | PRN
  Administered 2022-08-19 (×3): 1 via OPHTHALMIC

## 2022-08-19 MED ORDER — SIGHTPATH DOSE#1 NA HYALUR & NA CHOND-NA HYALUR IO KIT
PACK | INTRAOCULAR | Status: DC | PRN
  Administered 2022-08-19: 1 via OPHTHALMIC

## 2022-08-19 MED ORDER — SIGHTPATH DOSE#1 BSS IO SOLN
INTRAOCULAR | Status: DC | PRN
  Administered 2022-08-19: 15 mL

## 2022-08-19 MED ORDER — ONDANSETRON HCL 4 MG/2ML IJ SOLN: 4.0000 mg | Freq: Once | INTRAMUSCULAR | Status: DC | PRN

## 2022-08-19 MED ORDER — MIDAZOLAM HCL 2 MG/2ML IJ SOLN
INTRAMUSCULAR | Status: DC | PRN
  Administered 2022-08-19 (×2): 1 mg via INTRAVENOUS

## 2022-08-19 SURGICAL SUPPLY — 19 items
CANNULA ANT/CHMB 27G (MISCELLANEOUS) IMPLANT
CANNULA ANT/CHMB 27GA (MISCELLANEOUS) IMPLANT
CATARACT SUITE SIGHTPATH (MISCELLANEOUS) ×1 IMPLANT
DISSECTOR HYDRO NUCLEUS 50X22 (MISCELLANEOUS) ×1 IMPLANT
FEE CATARACT SUITE SIGHTPATH (MISCELLANEOUS) ×1 IMPLANT
GLOVE SURG GAMMEX PI TX LF 7.5 (GLOVE) ×1 IMPLANT
GLOVE SURG SYN 8.5  E (GLOVE) ×1
GLOVE SURG SYN 8.5 E (GLOVE) ×1 IMPLANT
GLOVE SURG SYN 8.5 PF PI (GLOVE) ×1 IMPLANT
LENS IOL TECNIS EYHANCE 22.0 (Intraocular Lens) IMPLANT
NDL FILTER BLUNT 18X1 1/2 (NEEDLE) ×1 IMPLANT
NEEDLE FILTER BLUNT 18X1 1/2 (NEEDLE) ×1 IMPLANT
PACK VIT ANT 23G (MISCELLANEOUS) IMPLANT
RING MALYGIN (MISCELLANEOUS) IMPLANT
SUT ETHILON 10-0 CS-B-6CS-B-6 (SUTURE)
SUTURE EHLN 10-0 CS-B-6CS-B-6 (SUTURE) IMPLANT
SYR 3ML LL SCALE MARK (SYRINGE) ×1 IMPLANT
SYR 5ML LL (SYRINGE) ×1 IMPLANT
WATER STERILE IRR 250ML POUR (IV SOLUTION) ×1 IMPLANT

## 2022-08-19 NOTE — Anesthesia Preprocedure Evaluation (Addendum)
Anesthesia Evaluation  Patient identified by MRN, date of birth, ID band Patient awake    Reviewed: Allergy & Precautions, H&P , NPO status , Patient's Chart, lab work & pertinent test results, reviewed documented beta blocker date and time   Airway Mallampati: III  TM Distance: >3 FB Neck ROM: full    Dental no notable dental hx.    Pulmonary neg pulmonary ROS, shortness of breath, COPD, Current Smoker and Patient abstained from smoking.,  H/o PE 08/2020   Pulmonary exam normal breath sounds clear to auscultation       Cardiovascular Exercise Tolerance: Good + CAD  negative cardio ROS   Rhythm:regular Rate:Normal     Neuro/Psych negative neurological ROS  negative psych ROS   GI/Hepatic negative GI ROS, Neg liver ROS,   Endo/Other  negative endocrine ROSHypothyroidism   Renal/GU Renal diseasenegative Renal ROS  negative genitourinary   Musculoskeletal  (+) Arthritis ,   Abdominal   Peds  Hematology negative hematology ROS (+)   Anesthesia Other Findings   Reproductive/Obstetrics negative OB ROS                            Anesthesia Physical Anesthesia Plan  ASA: 2  Anesthesia Plan: MAC   Post-op Pain Management:    Induction:   PONV Risk Score and Plan: TIVA, Midazolam and Treatment may vary due to age or medical condition  Airway Management Planned:   Additional Equipment:   Intra-op Plan:   Post-operative Plan:   Informed Consent: I have reviewed the patients History and Physical, chart, labs and discussed the procedure including the risks, benefits and alternatives for the proposed anesthesia with the patient or authorized representative who has indicated his/her understanding and acceptance.     Dental Advisory Given  Plan Discussed with: CRNA  Anesthesia Plan Comments:         Anesthesia Quick Evaluation

## 2022-08-19 NOTE — Op Note (Signed)
OPERATIVE NOTE  PINKEY MCJUNKIN [id number] 08/19/2022   PREOPERATIVE DIAGNOSIS:  Nuclear sclerotic cataract right eye.  H25.11   POSTOPERATIVE DIAGNOSIS:    Nuclear sclerotic cataract right eye.     PROCEDURE:  Phacoemusification with posterior chamber intraocular lens placement of the right eye   LENS:   Implant Name Type Inv. Item Serial No. Manufacturer Lot No. LRB No. Used Action  LENS IOL TECNIS EYHANCE 22.0 - [id number] Intraocular Lens LENS IOL TECNIS EYHANCE 22.0 [id number] [employer]  Right 1 Implanted       Procedure(s) with comments: CATARACT EXTRACTION PHACO AND INTRAOCULAR LENS PLACEMENT (IOC) RIGHT (Right) - 10.29 1:00.3  DIB00 +22.0   ULTRASOUND TIME: 1 minutes 00 seconds.  CDE 10.29   SURGEON:  Benay Pillow, MD, MPH  ANESTHESIOLOGIST: Anesthesiologist: April Manson, MD CRNA: Louanne Belton, CRNA   ANESTHESIA:  Topical with tetracaine drops augmented with 1% preservative-free intracameral lidocaine.  ESTIMATED BLOOD LOSS: less than 1 mL.   COMPLICATIONS:  None.   DESCRIPTION OF PROCEDURE:  The patient was identified in the holding room and transported to the operating room and placed in the supine position under the operating microscope.  The right eye was identified as the operative eye and it was prepped and draped in the usual sterile ophthalmic fashion.   A 1.0 millimeter clear-corneal paracentesis was made at the 10:30 position. 0.5 ml of preservative-free 1% lidocaine with epinephrine was injected into the anterior chamber.  The anterior chamber was filled with viscoelastic.  A 2.4 millimeter keratome was used to make a near-clear corneal incision at the 8:00 position.  A curvilinear capsulorrhexis was made with a cystotome and capsulorrhexis forceps.  Balanced salt solution was used to hydrodissect and hydrodelineate the nucleus.   Phacoemulsification was then used in stop and chop fashion to remove the lens nucleus and epinucleus.  The  remaining cortex was then removed using the irrigation and aspiration handpiece. Viscoelastic was then placed into the capsular bag to distend it for lens placement.  A lens was then injected into the capsular bag.  The remaining viscoelastic was aspirated.   Wounds were hydrated with balanced salt solution.  The anterior chamber was inflated to a physiologic pressure with balanced salt solution.   Intracameral vigamox 0.1 mL undiluted was injected into the eye and a drop placed onto the ocular surface.  No wound leaks were noted.  The patient was taken to the recovery room in stable condition without complications of anesthesia or surgery  Benay Pillow 08/19/2022, 9:28 AM

## 2022-08-19 NOTE — H&P (Signed)
[facility]   Primary Care Physician:  Gladstone Lighter, MD Ophthalmologist: Dr. Benay Pillow  Pre-Procedure History & Physical: HPI:  Miranda Cohen is a 76 y.o. female here for cataract surgery.   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 Authorizing Provider  Cholecalciferol (VITAMIN D3 PO) Take 1 tablet by mouth in the morning.   Yes [provider]  Cyanocobalamin (VITAMIN B-12 PO) Take 1 tablet by mouth in the morning.   Yes [provider]  diclofenac Sodium (VOLTAREN) 1 % GEL Apply 2 g topically 4 (four) times daily as needed (pain). 01/29/22  Yes [provider]  gabapentin (NEURONTIN) 300 MG capsule Take 300 mg by mouth 2 (two) times daily.   Yes [provider]  levothyroxine (SYNTHROID) 88 MCG tablet Take 88 mcg by mouth daily before breakfast. 02/12/22  Yes [provider]  mometasone-formoterol (DULERA) 200-5 MCG/ACT AERO Inhale 2 puffs into the lungs 2 (two) times daily.   Yes [provider]  Multiple Vitamin (MULTIVITAMIN WITH MINERALS) TABS tablet Take 1 tablet by mouth daily. 08/19/20  Yes Samuella Cota, MD  acetaminophen (TYLENOL) 500 MG tablet Take 1,000 mg by mouth every 6 (six) hours as needed (for pain.).    [provider]  furosemide (LASIX) 20 MG tablet Take 20 mg by mouth in the morning. Patient not taking: Reported on 08/15/2022 03/21/22   [provider]     Allergies as of 07/31/2022   (No Known Allergies)    History reviewed. No pertinent family history.  Social History   Socioeconomic History   Marital status: Divorced    Spouse name: Not on file   Number of children: Not on file   Years of education: Not on file   Highest education level: Not on file  Occupational History   Not on file  Tobacco Use   Smoking status: Every Day    Packs/day: 1.00    Years: 60.00    Total pack years: 60.00    Types: Cigarettes   Smokeless tobacco: Not on file   Tobacco comments:    (08/15/22 - has cut back to 1/2 PPD)  Vaping Use   Vaping Use: Some days  Substance and Sexual Activity   Alcohol use: Never   Drug use: Never   Sexual activity: Not on file  Other Topics Concern   Not on file  Social History Narrative   Not on file   Social Determinants of Health   Financial Resource Strain: Not on file  Food Insecurity: Not on file  Transportation Needs: Not on file  Physical Activity: Not on file  Stress: Not on file  Social Connections: Not on file  Intimate Partner Violence: Not on file    Review of Systems: See HPI, otherwise negative ROS  Physical Exam: BP (!) 161/79   Pulse 83   Temp (!) 96.6 F (35.9  C) (Temporal)   Ht 5\' 5"  (1.651 m)   Wt 91.3 kg   SpO2 95% Comment: started off at 88% on RA and after some deep breathing came up to 95%  BMI 33.50 kg/m  General:   Alert, cooperative in NAD Head:  Normocephalic and atraumatic. Respiratory:  Normal work of breathing. Cardiovascular:  RRR  Impression/Plan: Miranda Cohen is here for cataract surgery.  Risks, benefits, limitations, and alternatives regarding cataract surgery have been reviewed with the patient.  Questions have been answered.  All parties agreeable.   [name], MD  08/19/2022, 8:58 AM

## 2022-08-19 NOTE — Anesthesia Postprocedure Evaluation (Signed)
Anesthesia Post Note  Patient: Miranda Cohen  Procedure(s) Performed: CATARACT EXTRACTION PHACO AND INTRAOCULAR LENS PLACEMENT (IOC) RIGHT (Right: Eye)     Patient location during evaluation: PACU Anesthesia Type: MAC Level of consciousness: awake and alert Pain management: pain level controlled Vital Signs Assessment: post-procedure vital signs reviewed and stable Respiratory status: spontaneous breathing, nonlabored ventilation, respiratory function stable and patient connected to nasal cannula oxygen Cardiovascular status: stable and blood pressure returned to baseline Postop Assessment: no apparent nausea or vomiting Anesthetic complications: no   No notable events documented.  April Manson

## 2022-08-19 NOTE — Transfer of Care (Signed)
Immediate Anesthesia Transfer of Care Note  Patient: Miranda Cohen  Procedure(s) Performed: CATARACT EXTRACTION PHACO AND INTRAOCULAR LENS PLACEMENT (IOC) RIGHT (Right: Eye)  Patient Location: PACU  Anesthesia Type: MAC  Level of Consciousness: awake, alert  and patient cooperative  Airway and Oxygen Therapy: Patient Spontanous Breathing and Patient connected to supplemental oxygen  Post-op Assessment: Post-op Vital signs reviewed, Patient's Cardiovascular Status Stable, Respiratory Function Stable, Patent Airway and No signs of Nausea or vomiting  Post-op Vital Signs: Reviewed and stable  Complications: No notable events documented.

## 2022-08-20 ENCOUNTER — Other Ambulatory Visit: Payer: Self-pay

## 2022-08-20 ENCOUNTER — Encounter: Payer: Self-pay | Admitting: Ophthalmology

## 2022-08-26 ENCOUNTER — Encounter: Payer: Self-pay | Admitting: Anesthesiology

## 2022-09-02 ENCOUNTER — Ambulatory Visit: Admission: RE | Admit: 2022-09-02 | Payer: Medicare Other | Source: Home / Self Care | Admitting: Ophthalmology

## 2022-09-02 SURGERY — PHACOEMULSIFICATION, CATARACT, WITH IOL INSERTION
Anesthesia: Topical | Laterality: Left

## 2022-10-04 ENCOUNTER — Encounter: Payer: Self-pay | Admitting: Ophthalmology

## 2022-10-04 ENCOUNTER — Encounter: Payer: Self-pay | Admitting: Anesthesiology

## 2022-10-10 NOTE — Discharge Instructions (Signed)

## 2022-10-21 ENCOUNTER — Encounter: Payer: Self-pay | Admitting: Ophthalmology

## 2022-10-28 ENCOUNTER — Ambulatory Visit: Admission: RE | Admit: 2022-10-28 | Payer: Medicare Other | Source: Home / Self Care | Admitting: Ophthalmology

## 2022-10-28 SURGERY — PHACOEMULSIFICATION, CATARACT, WITH IOL INSERTION
Anesthesia: Topical | Laterality: Left

## 2023-10-13 ENCOUNTER — Encounter: Payer: Self-pay | Admitting: Ophthalmology

## 2023-10-14 ENCOUNTER — Encounter: Payer: Self-pay | Admitting: Ophthalmology

## 2023-10-14 NOTE — Anesthesia Preprocedure Evaluation (Addendum)
Anesthesia Evaluation  Patient identified by MRN, date of birth, ID band Patient awake    Reviewed: Allergy & Precautions, H&P , NPO status , Patient's Chart, lab work & pertinent test results  Airway Mallampati: III  TM Distance: <3 FB Neck ROM: Full    Dental no notable dental hx. (+) Upper Dentures, Lower Dentures   Pulmonary neg pulmonary ROS, shortness of breath, COPD, Current Smoker and Patient abstained from smoking.  Quit smoking 2 years ago (2022)  Her smoking use included cigarettes. She started smoking about 60 years ago. She has a 116 pack-year smoking history.   04-26-22 FINDINGS: Cardiovascular: Pulmonary arterial opacification is adequate without evidence of emboli. Enlarged main pulmonary artery measuring 3.3 cm in diameter is unchanged. The heart is normal in size. A small pericardial effusion is unchanged. Coronary and aortic atherosclerosis is noted.   Mediastinum/Nodes: No enlarged axillary, mediastinal, or hilar lymph nodes. Unchanged enlargement of the thyroid gland with the included portion being homogeneous in density and with no discrete nodules visible. Unremarkable esophagus.   Lungs/Pleura: No pleural effusion or pneumothorax. Mild paraseptal and centrilobular emphysema. Mild dependent ground-glass opacities in the lower lobes and left upper lobe, likely atelectasis.   Upper Abdomen: Unremarkable.   Musculoskeletal: Mild thoracic spondylosis.  Review of the MIP images confirms the above findings.   IMPRESSION: 1. No evidence of pulmonary emboli or other acute abnormality in the chest. 2. Unchanged small pericardial effusion. 3. Unchanged enlarged main pulmonary artery which can be seen with pulmonary arterial hypertension. 4. Aortic Atherosclerosis (ICD10-I70.0) and Emphysema (ICD10-J43.9).      Pulmonary exam normal breath sounds clear to auscultation       Cardiovascular + CAD and +  Peripheral Vascular Disease  negative cardio ROS Normal cardiovascular exam Rhythm:Regular Rate:Normal  08-08-20 1. Left ventricular ejection fraction, by estimation, is 60 to 65%. The  left ventricle has normal function. The left ventricle has no regional  wall motion abnormalities. The average left ventricular global  longitudinal strain is -19.3 %. The global  longitudinal strain is normal.   2. Right ventricular systolic function is normal. The right ventricular  size is mildly enlarged. Tricuspid regurgitation signal is inadequate for  assessing PA pressure.   3. Moderate pericardial effusion. The pericardial effusion is  circumferential. The majority of the effusion is posterior measuring  2.26cm in greatest diameter posterior to the LV.      Anterior to the RV there is a thickened appearing mass within the  pericardium. This could be a fat pad but it is thicker than would usually  be seen. Recommend cardiac MRI.   4. The mitral valve is normal in structure. No evidence of mitral valve  regurgitation. No evidence of mitral stenosis.   5. The aortic valve is normal in structure. Aortic valve regurgitation is  not visualized. No aortic stenosis is present.   6. The inferior vena cava is dilated in size with <50% respiratory  variability, suggesting right atrial pressure of 15 mmHg.     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 PAD (peripheral artery disease) Hypothyroidism  Acute metabolic encephalopathy Sepsis ([facility]) Coronary artery calcification Dyspnea  Arthritis Pulmonary embolism   Wears dentures Aortic atherosclerosis  Coronary atherosclerosis Pulmonary arterial hypertension      Reproductive/Obstetrics negative OB ROS                              Anesthesia Physical Anesthesia Plan  ASA: 3  Anesthesia Plan: MAC   Post-op Pain Management:    Induction: Intravenous  PONV Risk Score and Plan:   Airway Management Planned: Natural Airway and Nasal Cannula  Additional Equipment:   Intra-op Plan:   Post-operative Plan:   Informed Consent: I have reviewed the patients History and Physical, chart, labs and discussed the procedure including the risks, benefits and alternatives for the proposed anesthesia with the patient or authorized representative who has indicated his/her understanding and acceptance.     Dental Advisory Given  Plan Discussed with: Anesthesiologist, CRNA and Surgeon  Anesthesia Plan Comments: (Patient consented for risks of anesthesia including but not limited to:  - adverse reactions to medications - damage to eyes, teeth, lips or other oral mucosa - nerve damage due to positioning  - sore throat or hoarseness - Damage to heart, brain, nerves, lungs, other parts of body or loss of life  Patient voiced understanding and assent.)         Anesthesia Quick Evaluation

## 2023-10-17 NOTE — Discharge Instructions (Signed)

## 2023-10-20 ENCOUNTER — Ambulatory Visit: Payer: 59 | Admitting: Anesthesiology

## 2023-10-20 ENCOUNTER — Encounter
Admission: RE | Disposition: A | Discharge status: Home / Self Care | Payer: Self-pay | Source: Home / Self Care | Attending: Ophthalmology

## 2023-10-20 ENCOUNTER — Other Ambulatory Visit: Payer: Self-pay

## 2023-10-20 ENCOUNTER — Ambulatory Visit
Admission: RE | Admit: 2023-10-20 | Discharge: 2023-10-20 | Disposition: A | Discharge status: Home / Self Care | Payer: 59 | Attending: Ophthalmology | Admitting: Ophthalmology

## 2023-10-20 ENCOUNTER — Encounter: Payer: Self-pay | Admitting: Ophthalmology

## 2023-10-20 DIAGNOSIS — Z86711 Personal history of pulmonary embolism: Secondary | ICD-10-CM | POA: Diagnosis not present

## 2023-10-20 DIAGNOSIS — E063 Autoimmune thyroiditis: Secondary | ICD-10-CM | POA: Insufficient documentation

## 2023-10-20 DIAGNOSIS — F17210 Nicotine dependence, cigarettes, uncomplicated: Secondary | ICD-10-CM | POA: Diagnosis not present

## 2023-10-20 DIAGNOSIS — I2510 Atherosclerotic heart disease of native coronary artery without angina pectoris: Secondary | ICD-10-CM | POA: Insufficient documentation

## 2023-10-20 DIAGNOSIS — I700 Atherosclerosis of aorta: Secondary | ICD-10-CM | POA: Insufficient documentation

## 2023-10-20 DIAGNOSIS — Z7951 Long term (current) use of inhaled steroids: Secondary | ICD-10-CM | POA: Diagnosis not present

## 2023-10-20 DIAGNOSIS — H2512 Age-related nuclear cataract, left eye: Secondary | ICD-10-CM | POA: Insufficient documentation

## 2023-10-20 DIAGNOSIS — Z79890 Hormone replacement therapy (postmenopausal): Secondary | ICD-10-CM | POA: Insufficient documentation

## 2023-10-20 DIAGNOSIS — J432 Centrilobular emphysema: Secondary | ICD-10-CM | POA: Diagnosis not present

## 2023-10-20 HISTORY — DX: Secondary pulmonary arterial hypertension: I27.21

## 2023-10-20 HISTORY — PX: CATARACT EXTRACTION W/PHACO: SHX586

## 2023-10-20 HISTORY — DX: Atherosclerotic heart disease of native coronary artery without angina pectoris: I25.10

## 2023-10-20 HISTORY — DX: Presence of dental prosthetic device (complete) (partial): Z97.2

## 2023-10-20 HISTORY — DX: Personal history of pulmonary embolism: Z86.711

## 2023-10-20 SURGERY — PHACOEMULSIFICATION, CATARACT, WITH IOL INSERTION
Anesthesia: Monitor Anesthesia Care | Site: Eye | Laterality: Left

## 2023-10-20 MED ORDER — TETRACAINE HCL 0.5 % OP SOLN
1.0000 [drp] | OPHTHALMIC | Status: DC | PRN
  Administered 2023-10-20 (×3): 1 [drp] via OPHTHALMIC

## 2023-10-20 MED ORDER — MIDAZOLAM HCL 2 MG/2ML IJ SOLN
INTRAMUSCULAR | Status: AC
  Filled 2023-10-20: qty 2

## 2023-10-20 MED ORDER — MOXIFLOXACIN HCL 0.5 % OP SOLN
OPHTHALMIC | Status: DC | PRN
  Administered 2023-10-20: .2 mL via OPHTHALMIC

## 2023-10-20 MED ORDER — FENTANYL CITRATE (PF) 100 MCG/2ML IJ SOLN
INTRAMUSCULAR | Status: DC | PRN
  Administered 2023-10-20 (×2): 50 ug via INTRAVENOUS

## 2023-10-20 MED ORDER — MIDAZOLAM HCL 2 MG/2ML IJ SOLN
INTRAMUSCULAR | Status: DC | PRN
  Administered 2023-10-20 (×2): 1 mg via INTRAVENOUS

## 2023-10-20 MED ORDER — ARMC OPHTHALMIC DILATING DROPS
OPHTHALMIC | Status: AC
  Filled 2023-10-20: qty 0.5

## 2023-10-20 MED ORDER — TETRACAINE HCL 0.5 % OP SOLN
OPHTHALMIC | Status: AC
  Filled 2023-10-20: qty 4

## 2023-10-20 MED ORDER — ARMC OPHTHALMIC DILATING DROPS
1.0000 | OPHTHALMIC | Status: DC | PRN
  Administered 2023-10-20 (×3): 1 via OPHTHALMIC

## 2023-10-20 MED ORDER — SIGHTPATH DOSE#1 BSS IO SOLN
INTRAOCULAR | Status: DC | PRN
  Administered 2023-10-20: 15 mL via INTRAOCULAR

## 2023-10-20 MED ORDER — SIGHTPATH DOSE#1 BSS IO SOLN
INTRAOCULAR | Status: DC | PRN
  Administered 2023-10-20: 128 mL via OPHTHALMIC

## 2023-10-20 MED ORDER — SIGHTPATH DOSE#1 NA HYALUR & NA CHOND-NA HYALUR IO KIT
PACK | INTRAOCULAR | Status: DC | PRN
  Administered 2023-10-20: 1 via OPHTHALMIC

## 2023-10-20 MED ORDER — LIDOCAINE HCL (PF) 2 % IJ SOLN
INTRAOCULAR | Status: DC | PRN
  Administered 2023-10-20: 4 mL via INTRAOCULAR

## 2023-10-20 MED ORDER — FENTANYL CITRATE (PF) 100 MCG/2ML IJ SOLN
INTRAMUSCULAR | Status: AC
  Filled 2023-10-20: qty 2

## 2023-10-20 SURGICAL SUPPLY — 13 items
CANNULA ANT/CHMB 27G (MISCELLANEOUS) IMPLANT
CANNULA ANT/CHMB 27GA (MISCELLANEOUS)
CATARACT SUITE SIGHTPATH (MISCELLANEOUS) ×1 IMPLANT
DISSECTOR HYDRO NUCLEUS 50X22 (MISCELLANEOUS) ×1 IMPLANT
FEE CATARACT SUITE SIGHTPATH (MISCELLANEOUS) ×1 IMPLANT
GLOVE PI ULTRA LF STRL 7.5 (GLOVE) ×1 IMPLANT
GLOVE SURG POLYISOPRENE 8.5 (GLOVE) ×1
GLOVE SURG SYN 8.5 PF PI BL (GLOVE) ×1 IMPLANT
LENS IOL TECNIS EYHANCE 21.0 (Intraocular Lens) IMPLANT
NDL FILTER BLUNT 18X1 1/2 (NEEDLE) ×1 IMPLANT
NEEDLE FILTER BLUNT 18X1 1/2 (NEEDLE) ×1 IMPLANT
SYR 3ML LL SCALE MARK (SYRINGE) ×1 IMPLANT
SYR 5ML LL (SYRINGE) ×1 IMPLANT

## 2023-10-20 NOTE — Op Note (Signed)
OPERATIVE NOTE  JEMYA VIDRIO [id number] 10/20/2023   PREOPERATIVE DIAGNOSIS:  Nuclear sclerotic cataract left eye.  H25.12   POSTOPERATIVE DIAGNOSIS:    Nuclear sclerotic cataract left eye.     PROCEDURE:  Phacoemusification with posterior chamber intraocular lens placement of the left eye   LENS:   Implant Name Type Inv. Item Serial No. Manufacturer Lot No. LRB No. Used Action  LENS IOL TECNIS EYHANCE 21.0 - [id number] Intraocular Lens LENS IOL TECNIS EYHANCE 21.0 [id number] [employer]  Left 1 Implanted      Procedure(s): CATARACT EXTRACTION PHACO AND INTRAOCULAR LENS PLACEMENT (IOC) LEFT 10.80 01:06.3 (Left)  DIB00 +21.0   SURGEON:  Willey Blade, MD, MPH   ANESTHESIA:  Topical with tetracaine drops augmented with 1% preservative-free intracameral lidocaine.  ESTIMATED BLOOD LOSS: <1 mL   COMPLICATIONS:  None.   DESCRIPTION OF PROCEDURE:  The patient was identified in the holding room and transported to the operating room and placed in the supine position under the operating microscope.  The left eye was identified as the operative eye and it was prepped and draped in the usual sterile ophthalmic fashion.   A 1.0 millimeter clear-corneal paracentesis was made at the 5:00 position. 0.5 ml of preservative-free 1% lidocaine with epinephrine was injected into the anterior chamber.  The anterior chamber was filled with viscoelastic.  A 2.4 millimeter keratome was used to make a near-clear corneal incision at the 2:00 position.  A curvilinear capsulorrhexis was made with a cystotome and capsulorrhexis forceps.  Balanced salt solution was used to hydrodissect and hydrodelineate the nucleus.   Phacoemulsification was then used in stop and chop fashion to remove the lens nucleus and epinucleus.  The remaining cortex was then removed using the irrigation and aspiration handpiece. Viscoelastic was then placed into the capsular bag to distend it for lens placement.  A lens was then injected  into the capsular bag.  The remaining viscoelastic was aspirated.   Wounds were hydrated with balanced salt solution.  The anterior chamber was inflated to a physiologic pressure with balanced salt solution.  Intracameral vigamox 0.1 mL undiltued was injected into the eye and a drop placed onto the ocular surface.  No wound leaks were noted.  The patient was taken to the recovery room in stable condition without complications of anesthesia or surgery  Willey Blade 10/20/2023, 11:31 AM

## 2023-10-20 NOTE — Transfer of Care (Signed)
Immediate Anesthesia Transfer of Care Note  Patient: Miranda Cohen  Procedure(s) Performed: CATARACT EXTRACTION PHACO AND INTRAOCULAR LENS PLACEMENT (IOC) LEFT 10.80 01:06.3 (Left: Eye)  Patient Location: PACU  Anesthesia Type: MAC  Level of Consciousness: awake, alert  and patient cooperative  Airway and Oxygen Therapy: Patient Spontanous Breathing and Patient connected to supplemental oxygen  Post-op Assessment: Post-op Vital signs reviewed, Patient's Cardiovascular Status Stable, Respiratory Function Stable, Patent Airway and No signs of Nausea or vomiting  Post-op Vital Signs: Reviewed and stable  Complications: No notable events documented.

## 2023-10-20 NOTE — H&P (Signed)
[facility]   Primary Care Physician:  Enid Baas, MD Ophthalmologist: Dr. Willey Blade  Pre-Procedure History & Physical: HPI:  TESA MAGISTRO is a 77 y.o. female here for cataract surgery.   Past Medical History:  Diagnosis Date   Acute metabolic encephalopathy    Aortic atherosclerosis (HCC) 08/18/2020   Aortic atherosclerosis (HCC)    Arthritis    Chronic lymphocytic thyroiditis 08/18/2020   Coronary artery calcification    Coronary atherosclerosis    Dyspnea    Emphysema lung (HCC) 08/18/2020   History of pulmonary embolism    Hypothyroidism    Myxedema coma (HCC)    PAD (peripheral artery disease) (HCC)    Pericardial effusion    Pulmonary arterial hypertension (HCC)    Pulmonary embolism (HCC) 08/2020   Sepsis (HCC)    Wears dentures    full upper and lower    Past Surgical History:  Procedure Laterality Date   CATARACT EXTRACTION W/PHACO Right 08/19/2022   Procedure: CATARACT EXTRACTION PHACO AND INTRAOCULAR LENS PLACEMENT (IOC) RIGHT;  Surgeon: Nevada Crane, MD;  Location: [facility];  Service: Ophthalmology;  Laterality: Right;  10.29 1:00.3   TUBAL LIGATION      Prior to Admission medications   Medication Sig Start Date End Date Taking? Authorizing Provider  acetaminophen (TYLENOL) 500 MG tablet Take 1,000 mg by mouth every 6 (six) hours as needed (for pain.).   Yes [provider]  Cholecalciferol (VITAMIN D3 PO) Take 1 tablet by mouth in the morning.   Yes [provider]  Cyanocobalamin (VITAMIN B-12 PO) Take 1 tablet by mouth in the morning.   Yes [provider]  diclofenac Sodium (VOLTAREN) 1 % GEL Apply 2 g topically 4 (four) times daily as needed (pain). 01/29/22  Yes [provider]  gabapentin (NEURONTIN) 300 MG capsule Take 300 mg by mouth 2 (two) times daily.   Yes [provider]  levothyroxine (SYNTHROID) 88 MCG tablet Take 100 mcg by mouth daily before breakfast. 02/12/22   Yes [provider]  mometasone-formoterol (DULERA) 200-5 MCG/ACT AERO Inhale 2 puffs into the lungs 2 (two) times daily.   Yes [provider]  Multiple Vitamin (MULTIVITAMIN WITH MINERALS) TABS tablet Take 1 tablet by mouth daily. 08/19/20  Yes Standley Brooking, MD  Multiple Vitamins-Minerals (PRESERVISION AREDS PO) Take by mouth daily.   Yes [provider]  furosemide (LASIX) 20 MG tablet Take 20 mg by mouth in the morning. Patient not taking: Reported on 08/15/2022 03/21/22   [provider]    Allergies as of 09/25/2023   (No Known Allergies)    History reviewed. No pertinent family history.  Social History   Socioeconomic History   Marital status: Divorced    Spouse name: Not on file   Number of children: Not on file   Years of education: Not on file   Highest education level: Not on file  Occupational History   Not on file  Tobacco Use   Smoking status: Every Day    Current packs/day: 0.50    Average packs/day: 1 pack/day for 61.9 years (61.3 ttl pk-yrs)    Types: Cigarettes    Start date: 1963   Smokeless tobacco: Never   Tobacco comments:    (08/15/22 - has cut back to 1/2 PPD)  Vaping Use   Vaping status: Former  Substance and Sexual Activity   Alcohol use: Never   Drug use: Never   Sexual activity: Not on file  Other Topics Concern   Not on file  Social History Narrative   Not on file   Social Determinants of Health   Financial Resource Strain: Low Risk  (07/15/2023)   Received from [facility]   Overall Financial Resource Strain (CARDIA)    Difficulty of Paying Living Expenses: Not hard at all  Food Insecurity: No Food Insecurity (07/15/2023)   Received from [facility]   Hunger Vital Sign    Worried About Running Out of Food in the Last Year: Never true    Ran Out of Food in the Last Year: Never true  Transportation Needs: No Transportation Needs (07/15/2023)   Received from [facility] - Transportation    In the past 12 months, has lack of transportation kept you from medical appointments or from getting medications?: No    Lack of Transportation (Non-Medical): No  Physical Activity: Not on file  Stress: Not on file  Social Connections: Not on file  Intimate Partner Violence: Not on file    Review of Systems: See HPI, otherwise negative ROS  Physical Exam: BP (!) 159/69   Temp (!) 97.3 F (36.3 C) (Temporal)   Resp 16   Ht 5\' 5"  (1.651 m)   Wt 86.7 kg   SpO2 94%   BMI 31.82 kg/m  General:   Alert, cooperative in NAD Head:  Normocephalic and atraumatic. Respiratory:  Normal work of breathing. Cardiovascular:  RRR  Impression/Plan: MUNACHISO LEONHART is here for cataract surgery.  Risks, benefits, limitations, and alternatives regarding cataract surgery have been reviewed with the patient.  Questions have been answered.  All parties agreeable.   Willey Blade, MD  10/20/2023, 11:01 AM

## 2023-10-20 NOTE — Anesthesia Postprocedure Evaluation (Signed)
Anesthesia Post Note  Patient: Miranda Cohen  Procedure(s) Performed: CATARACT EXTRACTION PHACO AND INTRAOCULAR LENS PLACEMENT (IOC) LEFT 10.80 01:06.3 (Left: Eye)  Patient location during evaluation: PACU Anesthesia Type: MAC Level of consciousness: awake and alert Pain management: pain level controlled Vital Signs Assessment: post-procedure vital signs reviewed and stable Respiratory status: spontaneous breathing, nonlabored ventilation, respiratory function stable and patient connected to nasal cannula oxygen Cardiovascular status: stable and blood pressure returned to baseline Postop Assessment: no apparent nausea or vomiting Anesthetic complications: no   No notable events documented.   Last Vitals:  Vitals:   10/20/23 1131 10/20/23 1137  BP: 95/77 (!) 115/53  Pulse: 75 72  Resp: 18 18  Temp: 36.6 C 36.6 C  SpO2: 95% 93%    Last Pain:  Vitals:   10/20/23 1137  TempSrc:   PainSc: 0-No pain                 Baldo Hufnagle C Chele Cornell

## 2023-10-21 ENCOUNTER — Encounter: Payer: Self-pay | Admitting: Ophthalmology

## 2024-01-05 ENCOUNTER — Ambulatory Visit: Payer: Self-pay | Admitting: Surgery

## 2024-01-05 NOTE — H&P (View-Only) (Signed)
 Subjective:   CC: Epidermal cyst [L72.0]  HPI:  Miranda Cohen is a 78 y.o. female who was referred by Enid Baas, MD for evaluation of above. First noted a few years ago.  Recently growing in size, but asymptomatic otherwise.   Past Medical History:  has a past medical history of Emphysema lung (CMS/HHS-HCC), Emphysema of lung (CMS/HHS-HCC), Hyperthyroidism, and Pulmonary embolism (CMS/HHS-HCC).  Past Surgical History:  has a past surgical history that includes Tubal ligation.  Family History: family history includes No Known Problems in her brother, father, mother, and sister.  Social History:  reports that she quit smoking about 3 years ago. Her smoking use included cigarettes. She started smoking about 61 years ago. She has a 116 pack-year smoking history. She has never used smokeless tobacco. She reports that she does not currently use alcohol. She reports that she does not use drugs.  Current Medications: has a current medication list which includes the following prescription(s): atorvastatin, cholecalciferol, cyanocobalamin, diclofenac, gabapentin, levothyroxine, dulera, multivitamin, tramadol, and ipratropium-albuterol.  Allergies:  No Known Allergies  ROS:  A 15 point review of systems was performed and pertinent positives and negatives noted in HPI   Objective:     BP 108/71   Pulse 91   Ht 152.4 cm (5')   Wt 84.8 kg (187 lb)   BMI 36.52 kg/m   Constitutional :  No distress, cooperative, alert  Lymphatics/Throat:  Supple with no lymphadenopathy  Respiratory:  Clear to auscultation bilaterally  Cardiovascular:  Regular rate and rhythm  Gastrointestinal: Soft, non-tender, non-distended, no organomegaly.  Musculoskeletal: Steady gait and movement  Skin: Cool and moist, large, soft, mobile, superficial lump with blackhead at apex consistent with large epidermal cyst, over left shoulder area. 5cm x 5cm  Psychiatric: Normal affect, non-agitated, not confused          LABS:  n/a   RADS: n/a  Assessment:      Epidermal cyst [L72.0]  Plan:     1. Epidermal cyst [L72.0] Discussed surgical excision.  Alternatives include continued observation.  Benefits include possible symptom relief, pathologic evaluation, improved cosmesis. Discussed the risk of surgery including recurrence, chronic pain, post-op infxn, poor cosmesis, poor/delayed wound healing, and possible re-operation to address said risks. The risks of general anesthetic, if used, includes MI, CVA, sudden death or even reaction to anesthetic medications also discussed.  Typical post-op recovery time of 3-5 days with possible activity restrictions were also discussed.  The patient verbalized understanding and all questions were answered to the patient's satisfaction.  2. Patient has elected to proceed with surgical treatment. Procedure will be scheduled. In OR due to size.  Right side down.  labs/images/medications/previous chart entries reviewed personally and relevant changes/updates noted above.

## 2024-01-05 NOTE — H&P (Signed)
Subjective:   CC: Epidermal cyst [L72.0]  HPI:  Miranda Cohen is a 78 y.o. female who was referred by Enid Baas, MD for evaluation of above. First noted a few years ago.  Recently growing in size, but asymptomatic otherwise.   Past Medical History:  has a past medical history of Emphysema lung (CMS/HHS-HCC), Emphysema of lung (CMS/HHS-HCC), Hyperthyroidism, and Pulmonary embolism (CMS/HHS-HCC).  Past Surgical History:  has a past surgical history that includes Tubal ligation.  Family History: family history includes No Known Problems in her brother, father, mother, and sister.  Social History:  reports that she quit smoking about 3 years ago. Her smoking use included cigarettes. She started smoking about 61 years ago. She has a 116 pack-year smoking history. She has never used smokeless tobacco. She reports that she does not currently use alcohol. She reports that she does not use drugs.  Current Medications: has a current medication list which includes the following prescription(s): atorvastatin, cholecalciferol, cyanocobalamin, diclofenac, gabapentin, levothyroxine, dulera, multivitamin, tramadol, and ipratropium-albuterol.  Allergies:  No Known Allergies  ROS:  A 15 point review of systems was performed and pertinent positives and negatives noted in HPI   Objective:     BP 108/71   Pulse 91   Ht 152.4 cm (5')   Wt 84.8 kg (187 lb)   BMI 36.52 kg/m   Constitutional :  No distress, cooperative, alert  Lymphatics/Throat:  Supple with no lymphadenopathy  Respiratory:  Clear to auscultation bilaterally  Cardiovascular:  Regular rate and rhythm  Gastrointestinal: Soft, non-tender, non-distended, no organomegaly.  Musculoskeletal: Steady gait and movement  Skin: Cool and moist, large, soft, mobile, superficial lump with blackhead at apex consistent with large epidermal cyst, over left shoulder area. 5cm x 5cm  Psychiatric: Normal affect, non-agitated, not confused          LABS:  n/a   RADS: n/a  Assessment:      Epidermal cyst [L72.0]  Plan:     1. Epidermal cyst [L72.0] Discussed surgical excision.  Alternatives include continued observation.  Benefits include possible symptom relief, pathologic evaluation, improved cosmesis. Discussed the risk of surgery including recurrence, chronic pain, post-op infxn, poor cosmesis, poor/delayed wound healing, and possible re-operation to address said risks. The risks of general anesthetic, if used, includes MI, CVA, sudden death or even reaction to anesthetic medications also discussed.  Typical post-op recovery time of 3-5 days with possible activity restrictions were also discussed.  The patient verbalized understanding and all questions were answered to the patient's satisfaction.  2. Patient has elected to proceed with surgical treatment. Procedure will be scheduled. In OR due to size.  Right side down.  labs/images/medications/previous chart entries reviewed personally and relevant changes/updates noted above.

## 2024-01-27 ENCOUNTER — Ambulatory Visit: Payer: Self-pay | Admitting: Surgery

## 2024-01-27 MED ORDER — CHLORHEXIDINE GLUCONATE CLOTH 2 % EX PADS: 6.0000 | MEDICATED_PAD | Freq: Once | CUTANEOUS | Status: DC

## 2024-01-27 MED ORDER — CEFAZOLIN SODIUM-DEXTROSE 2-4 GM/100ML-% IV SOLN
2.0000 g | INTRAVENOUS | Status: AC
  Administered 2024-01-28: 2 g via INTRAVENOUS

## 2024-01-28 ENCOUNTER — Encounter: Payer: Self-pay | Admitting: Surgery

## 2024-01-28 ENCOUNTER — Ambulatory Visit: Payer: 59 | Admitting: Certified Registered"

## 2024-01-28 ENCOUNTER — Other Ambulatory Visit: Payer: Self-pay

## 2024-01-28 ENCOUNTER — Encounter
Admission: RE | Disposition: A | Discharge status: Home / Self Care | Payer: Self-pay | Source: Home / Self Care | Attending: Surgery

## 2024-01-28 ENCOUNTER — Ambulatory Visit
Admission: RE | Admit: 2024-01-28 | Discharge: 2024-01-28 | Disposition: A | Discharge status: Home / Self Care | Payer: 59 | Attending: Surgery | Admitting: Surgery

## 2024-01-28 DIAGNOSIS — J439 Emphysema, unspecified: Secondary | ICD-10-CM | POA: Insufficient documentation

## 2024-01-28 DIAGNOSIS — Z9981 Dependence on supplemental oxygen: Secondary | ICD-10-CM | POA: Insufficient documentation

## 2024-01-28 DIAGNOSIS — J449 Chronic obstructive pulmonary disease, unspecified: Secondary | ICD-10-CM

## 2024-01-28 DIAGNOSIS — Z87891 Personal history of nicotine dependence: Secondary | ICD-10-CM | POA: Insufficient documentation

## 2024-01-28 DIAGNOSIS — I2510 Atherosclerotic heart disease of native coronary artery without angina pectoris: Secondary | ICD-10-CM | POA: Diagnosis not present

## 2024-01-28 DIAGNOSIS — L720 Epidermal cyst: Secondary | ICD-10-CM | POA: Insufficient documentation

## 2024-01-28 DIAGNOSIS — I739 Peripheral vascular disease, unspecified: Secondary | ICD-10-CM | POA: Insufficient documentation

## 2024-01-28 DIAGNOSIS — E039 Hypothyroidism, unspecified: Secondary | ICD-10-CM | POA: Diagnosis not present

## 2024-01-28 HISTORY — PX: CYST EXCISION: SHX5701

## 2024-01-28 SURGERY — CYST REMOVAL
Anesthesia: General | Site: Neck | Laterality: Left

## 2024-01-28 MED ORDER — VITAMIN B-12 1000 MCG PO TABS: 3000.0000 ug | ORAL_TABLET | Freq: Every morning | ORAL | Status: DC

## 2024-01-28 MED ORDER — LIDOCAINE HCL (PF) 1 % IJ SOLN
INTRAMUSCULAR | Status: AC
  Filled 2024-01-28: qty 30

## 2024-01-28 MED ORDER — PROPOFOL 500 MG/50ML IV EMUL
INTRAVENOUS | Status: DC | PRN
  Administered 2024-01-28: 100 ug/kg/min via INTRAVENOUS

## 2024-01-28 MED ORDER — 0.9 % SODIUM CHLORIDE (POUR BTL) OPTIME
TOPICAL | Status: DC | PRN
  Administered 2024-01-28: 500 mL

## 2024-01-28 MED ORDER — CEFAZOLIN SODIUM-DEXTROSE 2-4 GM/100ML-% IV SOLN
INTRAVENOUS | Status: AC
  Filled 2024-01-28: qty 100

## 2024-01-28 MED ORDER — DOCUSATE SODIUM 100 MG PO CAPS: 100.0000 mg | ORAL_CAPSULE | Freq: Two times a day (BID) | ORAL | 0 refills | Status: AC | PRN

## 2024-01-28 MED ORDER — FENTANYL CITRATE (PF) 100 MCG/2ML IJ SOLN: 25.0000 ug | INTRAMUSCULAR | Status: DC | PRN

## 2024-01-28 MED ORDER — BUPIVACAINE-EPINEPHRINE (PF) 0.5% -1:200000 IJ SOLN
INTRAMUSCULAR | Status: AC
  Filled 2024-01-28: qty 30

## 2024-01-28 MED ORDER — LACTATED RINGERS IV SOLN: INTRAVENOUS | Status: DC

## 2024-01-28 MED ORDER — CHLORHEXIDINE GLUCONATE 0.12 % MT SOLN
OROMUCOSAL | Status: AC
Start: 2024-01-28 — End: ?
  Filled 2024-01-28: qty 15

## 2024-01-28 MED ORDER — FENTANYL CITRATE (PF) 100 MCG/2ML IJ SOLN
INTRAMUSCULAR | Status: DC | PRN
  Administered 2024-01-28: 10 ug via INTRAVENOUS

## 2024-01-28 MED ORDER — BUPIVACAINE-EPINEPHRINE 0.5% -1:200000 IJ SOLN
INTRAMUSCULAR | Status: DC | PRN
  Administered 2024-01-28: 20 mL

## 2024-01-28 MED ORDER — VITAMIN D3 75 MCG (3000 UT) PO TABS: 3000.0000 ug | ORAL_TABLET | Freq: Every morning | ORAL | Status: DC

## 2024-01-28 MED ORDER — CHLORHEXIDINE GLUCONATE 0.12 % MT SOLN
15.0000 mL | Freq: Once | OROMUCOSAL | Status: AC
  Administered 2024-01-28: 15 mL via OROMUCOSAL

## 2024-01-28 MED ORDER — IPRATROPIUM-ALBUTEROL 0.5-2.5 (3) MG/3ML IN SOLN
RESPIRATORY_TRACT | Status: AC
  Filled 2024-01-28: qty 3

## 2024-01-28 MED ORDER — FENTANYL CITRATE (PF) 100 MCG/2ML IJ SOLN
INTRAMUSCULAR | Status: AC
  Filled 2024-01-28: qty 2

## 2024-01-28 MED ORDER — OXYCODONE HCL 5 MG/5ML PO SOLN: 5.0000 mg | Freq: Once | ORAL | Status: DC | PRN

## 2024-01-28 MED ORDER — IPRATROPIUM-ALBUTEROL 0.5-2.5 (3) MG/3ML IN SOLN
3.0000 mL | Freq: Once | RESPIRATORY_TRACT | Status: AC
  Administered 2024-01-28: 3 mL via RESPIRATORY_TRACT

## 2024-01-28 MED ORDER — PROPOFOL 1000 MG/100ML IV EMUL
INTRAVENOUS | Status: AC
  Filled 2024-01-28: qty 100

## 2024-01-28 MED ORDER — TRAMADOL HCL 50 MG PO TABS: 50.0000 mg | ORAL_TABLET | Freq: Four times a day (QID) | ORAL | 0 refills | Status: DC | PRN

## 2024-01-28 MED ORDER — ORAL CARE MOUTH RINSE: 15.0000 mL | Freq: Once | OROMUCOSAL | Status: AC

## 2024-01-28 MED ORDER — ACETAMINOPHEN 325 MG PO TABS: 650.0000 mg | ORAL_TABLET | Freq: Three times a day (TID) | ORAL | 0 refills | Status: AC | PRN

## 2024-01-28 MED ORDER — OXYCODONE HCL 5 MG PO TABS: 5.0000 mg | ORAL_TABLET | Freq: Once | ORAL | Status: DC | PRN

## 2024-01-28 SURGICAL SUPPLY — 27 items
BLADE SURG 15 STRL LF DISP TIS (BLADE) ×1 IMPLANT
DERMABOND ADVANCED .7 DNX12 (GAUZE/BANDAGES/DRESSINGS) ×1 IMPLANT
DRAPE LAPAROTOMY 100X77 ABD (DRAPES) ×1 IMPLANT
DRAPE SHEET LG 3/4 BI-LAMINATE (DRAPES) ×1 IMPLANT
ELECT REM PT RETURN 9FT ADLT (ELECTROSURGICAL) ×1 IMPLANT
ELECTRODE REM PT RTRN 9FT ADLT (ELECTROSURGICAL) ×1 IMPLANT
GAUZE 4X4 16PLY ~~LOC~~+RFID DBL (SPONGE) IMPLANT
GLOVE BIOGEL PI IND STRL 7.0 (GLOVE) ×1 IMPLANT
GLOVE SURG SYN 6.5 ES PF (GLOVE) ×1 IMPLANT
GLOVE SURG SYN 6.5 PF PI (GLOVE) ×1 IMPLANT
GOWN STRL REUS W/ TWL LRG LVL3 (GOWN DISPOSABLE) ×2 IMPLANT
KIT TURNOVER KIT A (KITS) ×1 IMPLANT
LABEL OR SOLS (LABEL) ×1 IMPLANT
MANIFOLD NEPTUNE II (INSTRUMENTS) ×1 IMPLANT
NDL HYPO 22X1.5 SAFETY MO (MISCELLANEOUS) ×1 IMPLANT
NEEDLE HYPO 22X1.5 SAFETY MO (MISCELLANEOUS) ×1 IMPLANT
NS IRRIG 1000ML POUR BTL (IV SOLUTION) ×1 IMPLANT
PACK BASIN MINOR ARMC (MISCELLANEOUS) ×1 IMPLANT
SUT ETHILON 3-0 FS-10 30 BLK (SUTURE) IMPLANT
SUT MNCRL 4-0 27 PS-2 XMFL (SUTURE) ×1 IMPLANT
SUT VIC AB 3-0 SH 27X BRD (SUTURE) ×1 IMPLANT
SUTURE EHLN 3-0 FS-10 30 BLK (SUTURE) IMPLANT
SUTURE MNCRL 4-0 27XMF (SUTURE) ×1 IMPLANT
SYR 20ML LL LF (SYRINGE) ×1 IMPLANT
TOWEL OR 17X26 4PK STRL BLUE (TOWEL DISPOSABLE) IMPLANT
TRAP FLUID SMOKE EVACUATOR (MISCELLANEOUS) ×1 IMPLANT
WATER STERILE IRR 500ML POUR (IV SOLUTION) ×1 IMPLANT

## 2024-01-28 NOTE — Anesthesia Preprocedure Evaluation (Signed)
 Anesthesia Evaluation  Patient identified by MRN, date of birth, ID band Patient awake    Reviewed: Allergy & Precautions, H&P , NPO status , Patient's Chart, lab work & pertinent test results  Airway Mallampati: III  TM Distance: <3 FB Neck ROM: Full    Dental no notable dental hx. (+) Upper Dentures, Lower Dentures   Pulmonary neg pulmonary ROS, shortness of breath, COPD,  oxygen dependent, Current Smoker and Patient abstained from smoking.  Quit smoking 2 years ago (2022)  Her smoking use included cigarettes. She started smoking about 60 years ago. She has a 116 pack-year smoking history.   04-26-22 FINDINGS: Cardiovascular: Pulmonary arterial opacification is adequate without evidence of emboli. Enlarged main pulmonary artery measuring 3.3 cm in diameter is unchanged. The heart is normal in size. A small pericardial effusion is unchanged. Coronary and aortic atherosclerosis is noted.   Mediastinum/Nodes: No enlarged axillary, mediastinal, or hilar lymph nodes. Unchanged enlargement of the thyroid gland with the included portion being homogeneous in density and with no discrete nodules visible. Unremarkable esophagus.   Lungs/Pleura: No pleural effusion or pneumothorax. Mild paraseptal and centrilobular emphysema. Mild dependent ground-glass opacities in the lower lobes and left upper lobe, likely atelectasis.   Upper Abdomen: Unremarkable.   Musculoskeletal: Mild thoracic spondylosis.  Review of the MIP images confirms the above findings.   IMPRESSION: 1. No evidence of pulmonary emboli or other acute abnormality in the chest. 2. Unchanged small pericardial effusion. 3. Unchanged enlarged main pulmonary artery which can be seen with pulmonary arterial hypertension. 4. Aortic Atherosclerosis (ICD10-I70.0) and Emphysema (ICD10-J43.9).      Pulmonary exam normal breath sounds clear to auscultation        Cardiovascular + CAD and + Peripheral Vascular Disease  negative cardio ROS Normal cardiovascular exam Rhythm:Regular Rate:Normal  08-08-20 1. Left ventricular ejection fraction, by estimation, is 60 to 65%. The  left ventricle has normal function. The left ventricle has no regional  wall motion abnormalities. The average left ventricular global  longitudinal strain is -19.3 %. The global  longitudinal strain is normal.   2. Right ventricular systolic function is normal. The right ventricular  size is mildly enlarged. Tricuspid regurgitation signal is inadequate for  assessing PA pressure.   3. Moderate pericardial effusion. The pericardial effusion is  circumferential. The majority of the effusion is posterior measuring  2.26cm in greatest diameter posterior to the LV.      Anterior to the RV there is a thickened appearing mass within the  pericardium. This could be a fat pad but it is thicker than would usually  be seen. Recommend cardiac MRI.   4. The mitral valve is normal in structure. No evidence of mitral valve  regurgitation. No evidence of mitral stenosis.   5. The aortic valve is normal in structure. Aortic valve regurgitation is  not visualized. No aortic stenosis is present.   6. The inferior vena cava is dilated in size with <50% respiratory  variability, suggesting right atrial pressure of 15 mmHg.     Neuro/Psych negative neurological ROS  negative psych ROS   GI/Hepatic negative GI ROS, Neg liver ROS,,,  Endo/Other  negative endocrine ROSHypothyroidism    Renal/GU Renal diseasenegative Renal ROS  negative genitourinary   Musculoskeletal negative musculoskeletal ROS (+) Arthritis ,    Abdominal   Peds negative pediatric ROS (+)  Hematology negative hematology ROS (+)   Anesthesia Other Findings Pulmonary arterial hypertension per radiology CT scan   Hx pulmonary embolism Chronic  lymphocytic thyroiditis Aortic atherosclerosis  Emphysema lung    Pericardial effusion Myxedema coma --is taking thyroid meds now PAD (peripheral artery disease) Hypothyroidism  Acute metabolic encephalopathy Sepsis (HCC) Coronary artery calcification Dyspnea  Arthritis Pulmonary embolism   Wears dentures Aortic atherosclerosis  Coronary atherosclerosis Pulmonary arterial hypertension      Reproductive/Obstetrics negative OB ROS                             Anesthesia Physical Anesthesia Plan  ASA: 4  Anesthesia Plan: General   Post-op Pain Management:    Induction: Intravenous  PONV Risk Score and Plan: 2 and Propofol infusion and TIVA  Airway Management Planned: Natural Airway and Nasal Cannula  Additional Equipment:   Intra-op Plan:   Post-operative Plan:   Informed Consent: I have reviewed the patients History and Physical, chart, labs and discussed the procedure including the risks, benefits and alternatives for the proposed anesthesia with the patient or authorized representative who has indicated his/her understanding and acceptance.     Dental Advisory Given  Plan Discussed with: Anesthesiologist, CRNA and Surgeon  Anesthesia Plan Comments: (Patient consented for risks of anesthesia including but not limited to:  - adverse reactions to medications - damage to eyes, teeth, lips or other oral mucosa - nerve damage due to positioning  - sore throat or hoarseness - Damage to heart, brain, nerves, lungs, other parts of body or loss of life  Patient voiced understanding and assent.)        Anesthesia Quick Evaluation

## 2024-01-28 NOTE — Interval H&P Note (Signed)
 History and Physical Interval Note:  01/28/2024 1145   JADIN KAGEL  has presented today for surgery, with the diagnosis of L72.0 Epidermal cycst.  The various methods of treatment have been discussed with the patient and family. After consideration of risks, benefits and other options for treatment, the patient has consented to  Procedure(s): CYST REMOVAL (Left) as a surgical intervention.  The patient's history has been reviewed, patient examined, no change in status, stable for surgery.  I have reviewed the patient's chart and labs.  Questions were answered to the patient's satisfaction.     Karol Liendo Tonna Boehringer

## 2024-01-28 NOTE — Discharge Instructions (Signed)
 Removal, Care After This sheet gives you information about how to care for yourself after your procedure. Your health care provider may also give you more specific instructions. If you have problems or questions, contact your health care provider. What can I expect after the procedure? After the procedure, it is common to have: Soreness. Bruising. Itching. Follow these instructions at home: site care Follow instructions from your health care provider about how to take care of your site. Make sure you: Wash your hands with soap and water before and after you change your bandage (dressing). If soap and water are not available, use hand sanitizer. Leave stitches (sutures), skin glue, or adhesive strips in place. These skin closures may need to stay in place for 2 weeks or longer. If adhesive strip edges start to loosen and curl up, you may trim the loose edges. Do not remove adhesive strips completely unless your health care provider tells you to do that. If the area bleeds or bruises, apply gentle pressure for 10 minutes. OK TO SHOWER IN 24HRS  Check your site every day for signs of infection. Check for: Redness, swelling, or pain. Fluid or blood. Warmth. Pus or a bad smell.  General instructions Rest and then return to your normal activities as told by your health care provider.  tylenol and advil as needed for discomfort.  Please alternate between the two every four hours as needed for pain.    Use narcotics, if prescribed, only when tylenol and motrin is not enough to control pain.  325-650mg  every 8hrs to max of 3000mg /24hrs (including the 325mg  in every norco dose) for the tylenol.    Advil up to 800mg  per dose every 8hrs as needed for pain.   Keep all follow-up visits as told by your health care provider. This is important. Contact a health care provider if: You have redness, swelling, or pain around your site. You have fluid or blood coming from your site. Your site feels warm to  the touch. You have pus or a bad smell coming from your site. You have a fever. Your sutures, skin glue, or adhesive strips loosen or come off sooner than expected. Get help right away if: You have bleeding that does not stop with pressure or a dressing. Summary After the procedure, it is common to have some soreness, bruising, and itching at the site. Follow instructions from your health care provider about how to take care of your site. Check your site every day for signs of infection. Contact a health care provider if you have redness, swelling, or pain around your site, or your site feels warm to the touch. Keep all follow-up visits as told by your health care provider. This is important. This information is not intended to replace advice given to you by your health care provider. Make sure you discuss any questions you have with your health care provider. Document Released: 12/15/2015 Document Revised: 05/18/2018 Document Reviewed: 05/18/2018 Elsevier Interactive Patient Education  [employer].

## 2024-01-28 NOTE — Op Note (Signed)
 Pre-Op Dx: epidermal cyst Post-Op Dx: same Anesthesia: MAC EBL: 10ml Complications:  none apparent Specimen: epidermal cyst Procedure: excisional biopsy of epidermal cyst, left shoulder Surgeon: Tonna Boehringer  Indications for procedure: See H&P  Description of Procedure:  Consent obtained, time out performed.  Patient placed in right lateral position.  Area sterilized and draped in usual position.  Local infused to area previously marked.  8cm incision made through dermis with 15blade and cyst noted in subcutaneous layer.  The  8cm x 5.3cm x 7.3cm cyst then removed from surrounding tissue completely using electrocautery, passed off field pending pathology.  Wound hemostasis noted, then closed with running 4-0 monocryl in subcuticular fashion for epidermal layer.  Wound then dressed with dermabond.  Pt tolerated procedure well, and transferred to PACU in stable condition. Sponge and instrument count correct at end of procedure.

## 2024-01-28 NOTE — Transfer of Care (Signed)
 Immediate Anesthesia Transfer of Care Note  Patient: Miranda Cohen  Procedure(s) Performed: CYST REMOVAL (Left: Neck)  Patient Location: PACU  Anesthesia Type:MAC  Level of Consciousness: drowsy  Airway & Oxygen Therapy: Patient Spontanous Breathing and Patient connected to face mask oxygen  Post-op Assessment: Report given to RN and Post -op Vital signs reviewed and stable  Post vital signs: stable  Last Vitals:  Vitals Value Taken Time  BP 104/53 01/28/24 1308  Temp    Pulse 77 01/28/24 1310  Resp 19 01/28/24 1310  SpO2 99 % 01/28/24 1310  Vitals shown include unfiled device data.  Last Pain:  Vitals:   01/28/24 1107  TempSrc: Oral  PainSc: 4          Complications: No notable events documented.

## 2024-01-29 LAB — SURGICAL PATHOLOGY

## 2024-01-29 NOTE — Anesthesia Postprocedure Evaluation (Signed)
 Anesthesia Post Note  Patient: Miranda Cohen  Procedure(s) Performed: CYST REMOVAL (Left: Neck)  Patient location during evaluation: PACU Anesthesia Type: General Level of consciousness: awake and alert Pain management: pain level controlled Vital Signs Assessment: post-procedure vital signs reviewed and stable Respiratory status: spontaneous breathing, nonlabored ventilation, respiratory function stable and patient connected to nasal cannula oxygen Cardiovascular status: blood pressure returned to baseline and stable Postop Assessment: no apparent nausea or vomiting Anesthetic complications: no  No notable events documented.   Last Vitals:  Vitals:   01/28/24 1350 01/28/24 1354  BP:  (!) 143/59  Pulse: 85 89  Resp: (!) 22 18  Temp:  (!) 36.4 C  SpO2: 94% 97%    Last Pain:  Vitals:   01/28/24 1354  TempSrc: Temporal  PainSc: 0-No pain                 Stephanie Coup

## 2024-01-30 ENCOUNTER — Encounter: Payer: Self-pay | Admitting: Surgery

## 2024-06-11 ENCOUNTER — Emergency Department

## 2024-06-11 ENCOUNTER — Emergency Department
Admission: EM | Admit: 2024-06-11 | Discharge: 2024-06-11 | Disposition: A | Discharge status: Home / Self Care | Source: Ambulatory Visit | Attending: Emergency Medicine | Admitting: Emergency Medicine

## 2024-06-11 ENCOUNTER — Other Ambulatory Visit: Payer: Self-pay

## 2024-06-11 DIAGNOSIS — M79661 Pain in right lower leg: Secondary | ICD-10-CM | POA: Insufficient documentation

## 2024-06-11 DIAGNOSIS — M79604 Pain in right leg: Secondary | ICD-10-CM

## 2024-06-11 DIAGNOSIS — M25471 Effusion, right ankle: Secondary | ICD-10-CM | POA: Diagnosis present

## 2024-06-11 NOTE — ED Notes (Signed)
 Pt inquiring about how much longer it will be before she gets her results. Provider made aware.

## 2024-06-11 NOTE — ED Notes (Addendum)
 Pt is CAOx4, breathing normally, and normal in color. Pt is complaining of right leg pain that has been going on for a while. Pt states can barley stand or walk on her leg. Pt states she wants to be evaluated for blood clot per KC. Pt is obvious distress when you touch or move her right leg. Pt in bed and in NAD at this time. Pt denies any needs.   Pt's right leg/foot visibly swollen and red. Denies any trauma.

## 2024-06-11 NOTE — Discharge Instructions (Signed)
 The ultrasound was negative for blood clot.  The x-ray did not show fractures.  Please ice and elevate your ankle.  This will help decrease the swelling.  You can continue to wrap your ankle with Ace bandage or wear a compression stocking.  Please follow-up with your primary care provider or orthopedics.  Return to the emergency department with any worsening symptoms.

## 2024-06-11 NOTE — ED Triage Notes (Signed)
 Pt comes with leg pain. Pt sent form [facility] to rule out DVT. Pt states right leg pain.

## 2024-06-11 NOTE — ED Provider Notes (Signed)
 [facility] Provider Note    Event Date/Time   First MD Initiated Contact with Patient 06/11/24 1526     (approximate)   History   Leg Pain   HPI  LUBERTHA LEITE is a 78 y.o. female with PMH of emphysema, PE, aortic atherosclerosis, PAD, arthritis presents for evaluation of right lower leg pain.  Patient noticed swelling in her ankle 3 days ago.  Denies any specific injury.  She was unable to get an appointment with her orthopedic provider so she went to the walk-in clinic.  They recommended she come to the emergency department for DVT rule out.      Physical Exam   Triage Vital Signs: ED Triage Vitals  Encounter Vitals Group     BP 06/11/24 1321 132/72     Girls Systolic BP Percentile --      Girls Diastolic BP Percentile --      Boys Systolic BP Percentile --      Boys Diastolic BP Percentile --      Pulse Rate 06/11/24 1321 73     Resp 06/11/24 1321 18     Temp 06/11/24 1321 97.9 F (36.6 C)     Temp Source 06/11/24 1321 Oral     SpO2 06/11/24 1321 94 %     Weight --      Height --      Head Circumference --      Peak Flow --      Pain Score 06/11/24 1255 5     Pain Loc --      Pain Education --      Exclude from Growth Chart --     Most recent vital signs: Vitals:   06/11/24 1321  BP: 132/72  Pulse: 73  Resp: 18  Temp: 97.9 F (36.6 C)  SpO2: 94%    General: Awake, no distress.  CV:  Good peripheral perfusion.  Resp:  Normal effort.  Abd:  No distention.  Other:  Right ankle is swollen bilaterally, warm to touch, very tender to palpation, ROM is limited due to pain and swelling, dorsalis pedis pulse is 2+ and regular.   ED Results / Procedures / Treatments   Labs (all labs ordered are listed, but only abnormal results are displayed) Labs Reviewed - No data to display   RADIOLOGY  Right lower extremity ultrasound obtained, interpreted the images as well as reviewed the radiologist report was negative for  DVT.   PROCEDURES:  Critical Care performed: No  Procedures   MEDICATIONS ORDERED IN ED: Medications - No data to display   IMPRESSION / MDM / ASSESSMENT AND PLAN / ED COURSE  I reviewed the triage vital signs and the nursing notes.                             78 year old female presents for evaluation of right ankle pain and swelling.  Vital signs are stable patient NAD on exam.  Differential diagnosis includes, but is not limited to, DVT, fracture, sprain, cellulitis, dependent edema.  Patient's presentation is most consistent with acute complicated illness / injury requiring diagnostic workup.  Ultrasound of the right lower extremity is negative for DVT.  Patient does have quite a bit of swelling at the right ankle so we will obtain x-ray to rule out fracture.  X-rays negative for fracture.  Unsure of exactly what is causing patient's ankle swelling but do not feel she  needs further emergent workup.  Will wrap patient's ankle with an Ace wrap.  Advised ice and elevation.  She can take Tylenol  as needed for pain.  Follow-up with PCP and orthopedics as needed.  Patient voiced understanding, all questions were answered and she was stable at discharge.      FINAL CLINICAL IMPRESSION(S) / ED DIAGNOSES   Final diagnoses:  Right ankle swelling     Rx / DC Orders   ED Discharge Orders     None        Note:  This document was prepared using Dragon voice recognition software and may include unintentional dictation errors.   Cleaster Tinnie LABOR, PA-C 06/11/24 1740    Malvina Alm DASEN, MD 06/12/24 1104

## 2024-06-14 NOTE — Progress Notes (Signed)
 Chief Complaint Chief Complaint  Patient presents with  . Right Ankle - Pain    Reason for Visit The patient is a 78 year old female who presented for complaints of right leg and ankle pain.  She states that on June 08, 2024 she started having some pain involving the inside of the right ankle with a small knot on the inside.  She started having increased swelling and redness going up the leg into the calf.  She went to the emergency room on June 11, 2024 where they got x-rays of the ankle and an ultrasound that were negative.  She was told she may have a chip in her ankle and was told to follow-up here.  She still has some redness and swelling.  She has no warmth.  She has no numbness or tingling.  She has no pain past her knee.  The patient is using a wheelchair.  The patient is not a diabetic.   Medications Current Outpatient Medications  Medication Sig Dispense Refill  . atorvastatin (LIPITOR) 20 MG tablet Take 1 tablet (20 mg total) by mouth once daily 90 tablet 1  . cholecalciferol (VITAMIN D3) 1000 unit tablet Take 1,000 Units by mouth once daily    . cyanocobalamin (VITAMIN B12) 1000 MCG tablet Take 1,000 mcg by mouth once daily    . diclofenac (VOLTAREN) 1 % topical gel Apply 2 g topically 4 (four) times daily 450 g 1  . FUROsemide  (LASIX ) 20 MG tablet Take 1 tablet (20 mg total) by mouth once daily as needed for Edema 30 tablet 2  . gabapentin (NEURONTIN) 300 MG capsule Take 2 capsules (600 mg total) by mouth 2 (two) times daily 360 capsule 1  . levothyroxine  (SYNTHROID ) 112 MCG tablet Take 1 tablet (112 mcg total) by mouth once daily Take on an empty stomach with a glass of water  at least 30-60 minutes before breakfast. 90 tablet 1  . mometasone -formoterol  (DULERA ) 200-5 mcg/actuation inhaler INHALE 2 INHALATIONS INTO THE LUNGS 2 TIMES DAILY 13 g 3  . multivitamin tablet Take 1 tablet by mouth once daily    . HYDROcodone-acetaminophen  (NORCO) 5-325 mg tablet Take 1 tablet by mouth  every 6 (six) hours as needed for Pain for up to 30 doses 30 tablet 0  . ipratropium-albuteroL  (DUO-NEB) nebulizer solution Take 3 mLs by nebulization 3 (three) times daily for 10 days 100 mL 1  . predniSONE  (DELTASONE ) 10 MG tablet 5,5,4,4,3,3,2,2,1,1 tab po tapering dose 30 tablet 0  . traMADoL  (ULTRAM ) 50 mg tablet Take 1 tablet p.o. twice daily as needed severe pain only (Patient not taking: Reported on 06/14/2024) 20 tablet 0   No current facility-administered medications for this visit.    Allergies Patient has no known allergies.  Histories Past Medical History: Past Medical History:  Diagnosis Date  . Emphysema lung (CMS/HHS-HCC)   . Emphysema of lung (CMS/HHS-HCC)   . Hyperthyroidism   . Pulmonary embolism (CMS/HHS-HCC)     Past Surgical History: Past Surgical History:  Procedure Laterality Date  . CYSTECTOMY Left 01/28/2024   Epidermal neck Dr. Tye  . TUBAL LIGATION      Social History: Social History   Socioeconomic History  . Marital status: Divorced  Tobacco Use  . Smoking status: Every Day    Current packs/day: 0.50    Average packs/day: 2.0 packs/day for 59.5 years (116.8 ttl pk-yrs)    Types: Cigarettes    Start date: 11/20/1962    Last attempt to quit: 11/20/2020  . Smokeless  tobacco: Never  . Tobacco comments:    *Says smokes less than a pack a week currently  Vaping Use  . Vaping status: Never Used  Substance and Sexual Activity  . Alcohol  use: Not Currently  . Drug use: Never  . Sexual activity: Defer   Social Drivers of Health   Financial Resource Strain: Low Risk  (12/16/2023)   Overall Financial Resource Strain (CARDIA)   . Difficulty of Paying Living Expenses: Not hard at all  Food Insecurity: No Food Insecurity (12/16/2023)   Hunger Vital Sign   . Worried About Programme researcher, broadcasting/film/video in the Last Year: Never true   . Ran Out of Food in the Last Year: Never true  Transportation Needs: No Transportation Needs (12/16/2023)   PRAPARE -  Transportation   . Lack of Transportation (Medical): No   . Lack of Transportation (Non-Medical): No  Housing Stability: Unknown (02/18/2024)   Housing Stability Vital Sign   . Unable to Pay for Housing in the Last Year: No   . Homeless in the Last Year: No    Family History: Family History  Problem Relation Name Age of Onset  . No Known Problems Mother    . No Known Problems Father    . No Known Problems Sister    . No Known Problems Brother      Review of Systems A comprehensive 14 point ROS was performed, reviewed, and the pertinent orthopaedic findings are documented in the HPI.  Exam BP 122/86   Ht 165.1 cm (5' 5)   Wt 79.8 kg (176 lb)   BMI 29.29 kg/m    General/Constitutional: NAD, conversant Eyes: Pupils equal and round, extraocular movements intact ENT: atraumatic external nose and ears, moist mucous membranes Respiratory: non-labored breathing, symmetric chest rise, chest sounds clear. Cardiovascular: no visible lower extremity edema, peripheral pulses present, regular rate and rhythm  Skin: normal skin turgor, warm and dry Neurological: cranial nerves grossly intact, sensation grossly intact Psychological:  Appropriate mood and affect; appropriate judgment Musculoskeletal: as detailed below:  General: Well developed, well nourished 79 y.o. female in no apparent distress.  Normal affect.  Normal communication.  Patient answers questions appropriately.  The patient is in a wheelchair with no gait noted.    Right lower Extremity: Examination of the right lower extremity reveals no bony abnormality, positive diffuse medial ankle and inferior gastrocnemius edema, and no ecchymosis.  There is minimal warmth and erythema involving the ankle extending into the posterior gastrocnemius.  She has limited range of motion of the ankle joint secondary to Pain.  She is tender along the medial malleolus and mild lateral malleolus to palpation.  She has a minimally positive Homans  test on the right.  She has sensation that is intact.  Xrays: Xrays were reviewed of the right ankle that were taken at [facility] on June 11, 2024.  The xrays showed the right ankle with no fracture line or translucency.  The ankle mortise is well-maintained.  Doppler ultrasound results: Examination of the right lower extremity with a Doppler ultrasound revealed no signs of DVT   Impression Encounter Diagnoses  Name Primary?  . Right leg swelling Yes  . Acute right ankle pain     Plan   1.  I discussed the physical exam finding as well as x-ray results and previous treatment. 2.  The patient was sent to the lab for a CBC, uric acid level, rheumatoid panel to evaluate for the right lower extremity pain and  swelling. 3.  She was given hydrocodone for breakthrough pain instead of the tramadol .  We will use caution with narcotics. 4.  The patient will use prednisone  10 mg tapering dose for 10 days. 5.  The patient will continue to ice and elevate. 6.  The patient will follow-up with [facility] for possible knee injection and can reevaluate her right ankle.  Review of the [place]  CSRS was performed in accordance with the [place] MVE prior to dispensing any controlled substances.  This note was generated in part with voice recognition software and I apologize for any typographical errors that were not detected and corrected.   Dorn Krystal Doyne PA-C, [email or web address]

## 2024-06-17 NOTE — Result Encounter Note (Signed)
 Spoke with pt about results, informed her of referral. Pt voiced understanding and stated that she was feeling a little better since taking the prednisone .

## 2024-06-21 ENCOUNTER — Emergency Department (HOSPITAL_COMMUNITY)

## 2024-06-21 ENCOUNTER — Inpatient Hospital Stay (HOSPITAL_COMMUNITY)

## 2024-06-21 ENCOUNTER — Inpatient Hospital Stay (HOSPITAL_COMMUNITY)
Admission: EM | Admit: 2024-06-21 | Discharge: 2024-07-02 | DRG: 871 | Disposition: E | Attending: Pulmonary Disease | Admitting: Pulmonary Disease

## 2024-06-21 ENCOUNTER — Other Ambulatory Visit: Payer: Self-pay

## 2024-06-21 DIAGNOSIS — N179 Acute kidney failure, unspecified: Secondary | ICD-10-CM | POA: Diagnosis not present

## 2024-06-21 DIAGNOSIS — E871 Hypo-osmolality and hyponatremia: Secondary | ICD-10-CM | POA: Diagnosis not present

## 2024-06-21 DIAGNOSIS — E875 Hyperkalemia: Secondary | ICD-10-CM | POA: Diagnosis not present

## 2024-06-21 DIAGNOSIS — Z7951 Long term (current) use of inhaled steroids: Secondary | ICD-10-CM

## 2024-06-21 DIAGNOSIS — D696 Thrombocytopenia, unspecified: Secondary | ICD-10-CM | POA: Diagnosis present

## 2024-06-21 DIAGNOSIS — Z79890 Hormone replacement therapy (postmenopausal): Secondary | ICD-10-CM

## 2024-06-21 DIAGNOSIS — G9341 Metabolic encephalopathy: Secondary | ICD-10-CM | POA: Diagnosis present

## 2024-06-21 DIAGNOSIS — Z515 Encounter for palliative care: Secondary | ICD-10-CM

## 2024-06-21 DIAGNOSIS — I3139 Other pericardial effusion (noninflammatory): Secondary | ICD-10-CM | POA: Diagnosis present

## 2024-06-21 DIAGNOSIS — I739 Peripheral vascular disease, unspecified: Secondary | ICD-10-CM | POA: Diagnosis present

## 2024-06-21 DIAGNOSIS — K7200 Acute and subacute hepatic failure without coma: Secondary | ICD-10-CM | POA: Diagnosis not present

## 2024-06-21 DIAGNOSIS — R6521 Severe sepsis with septic shock: Secondary | ICD-10-CM | POA: Diagnosis present

## 2024-06-21 DIAGNOSIS — Z1152 Encounter for screening for COVID-19: Secondary | ICD-10-CM

## 2024-06-21 DIAGNOSIS — E039 Hypothyroidism, unspecified: Secondary | ICD-10-CM | POA: Diagnosis not present

## 2024-06-21 DIAGNOSIS — Z66 Do not resuscitate: Secondary | ICD-10-CM | POA: Diagnosis present

## 2024-06-21 DIAGNOSIS — J439 Emphysema, unspecified: Secondary | ICD-10-CM | POA: Diagnosis present

## 2024-06-21 DIAGNOSIS — R4182 Altered mental status, unspecified: Secondary | ICD-10-CM

## 2024-06-21 DIAGNOSIS — I2721 Secondary pulmonary arterial hypertension: Secondary | ICD-10-CM | POA: Diagnosis present

## 2024-06-21 DIAGNOSIS — E063 Autoimmune thyroiditis: Secondary | ICD-10-CM | POA: Diagnosis present

## 2024-06-21 DIAGNOSIS — Z86711 Personal history of pulmonary embolism: Secondary | ICD-10-CM

## 2024-06-21 DIAGNOSIS — E2740 Unspecified adrenocortical insufficiency: Secondary | ICD-10-CM | POA: Diagnosis present

## 2024-06-21 DIAGNOSIS — I700 Atherosclerosis of aorta: Secondary | ICD-10-CM | POA: Diagnosis present

## 2024-06-21 DIAGNOSIS — J9621 Acute and chronic respiratory failure with hypoxia: Secondary | ICD-10-CM | POA: Diagnosis present

## 2024-06-21 DIAGNOSIS — J9602 Acute respiratory failure with hypercapnia: Secondary | ICD-10-CM | POA: Diagnosis not present

## 2024-06-21 DIAGNOSIS — F17210 Nicotine dependence, cigarettes, uncomplicated: Secondary | ICD-10-CM | POA: Diagnosis present

## 2024-06-21 DIAGNOSIS — M25571 Pain in right ankle and joints of right foot: Secondary | ICD-10-CM | POA: Diagnosis present

## 2024-06-21 DIAGNOSIS — R578 Other shock: Secondary | ICD-10-CM | POA: Diagnosis not present

## 2024-06-21 DIAGNOSIS — N1832 Chronic kidney disease, stage 3b: Secondary | ICD-10-CM | POA: Diagnosis present

## 2024-06-21 DIAGNOSIS — J440 Chronic obstructive pulmonary disease with acute lower respiratory infection: Secondary | ICD-10-CM | POA: Diagnosis present

## 2024-06-21 DIAGNOSIS — J189 Pneumonia, unspecified organism: Secondary | ICD-10-CM | POA: Diagnosis present

## 2024-06-21 DIAGNOSIS — I4891 Unspecified atrial fibrillation: Secondary | ICD-10-CM | POA: Diagnosis present

## 2024-06-21 DIAGNOSIS — N1831 Chronic kidney disease, stage 3a: Secondary | ICD-10-CM | POA: Diagnosis not present

## 2024-06-21 DIAGNOSIS — D689 Coagulation defect, unspecified: Secondary | ICD-10-CM | POA: Diagnosis present

## 2024-06-21 DIAGNOSIS — Z7189 Other specified counseling: Secondary | ICD-10-CM | POA: Diagnosis not present

## 2024-06-21 DIAGNOSIS — E162 Hypoglycemia, unspecified: Secondary | ICD-10-CM | POA: Diagnosis not present

## 2024-06-21 DIAGNOSIS — A419 Sepsis, unspecified organism: Secondary | ICD-10-CM | POA: Diagnosis present

## 2024-06-21 DIAGNOSIS — R609 Edema, unspecified: Secondary | ICD-10-CM | POA: Diagnosis not present

## 2024-06-21 DIAGNOSIS — I2510 Atherosclerotic heart disease of native coronary artery without angina pectoris: Secondary | ICD-10-CM | POA: Diagnosis present

## 2024-06-21 DIAGNOSIS — J9622 Acute and chronic respiratory failure with hypercapnia: Secondary | ICD-10-CM | POA: Diagnosis present

## 2024-06-21 DIAGNOSIS — J9601 Acute respiratory failure with hypoxia: Secondary | ICD-10-CM | POA: Diagnosis not present

## 2024-06-21 DIAGNOSIS — R7401 Elevation of levels of liver transaminase levels: Secondary | ICD-10-CM | POA: Diagnosis not present

## 2024-06-21 DIAGNOSIS — Z79899 Other long term (current) drug therapy: Secondary | ICD-10-CM

## 2024-06-21 LAB — I-STAT ARTERIAL BLOOD GAS, ED
Acid-base deficit: 14 mmol/L — ABNORMAL HIGH (ref 0.0–2.0)
Bicarbonate: 16.1 mmol/L — ABNORMAL LOW (ref 20.0–28.0)
Calcium, Ion: 1.07 mmol/L — ABNORMAL LOW (ref 1.15–1.40)
HCT: 51 % — ABNORMAL HIGH (ref 36.0–46.0)
Hemoglobin: 17.3 g/dL — ABNORMAL HIGH (ref 12.0–15.0)
O2 Saturation: 98 %
Patient temperature: 102.1
Potassium: 5.2 mmol/L — ABNORMAL HIGH (ref 3.5–5.1)
Sodium: 135 mmol/L (ref 135–145)
TCO2: 18 mmol/L — ABNORMAL LOW (ref 22–32)
pCO2 arterial: 55.9 mmHg — ABNORMAL HIGH (ref 32–48)
pH, Arterial: 7.078 — CL (ref 7.35–7.45)
pO2, Arterial: 166 mmHg — ABNORMAL HIGH (ref 83–108)

## 2024-06-21 LAB — CBC
HCT: 42.2 % (ref 36.0–46.0)
Hemoglobin: 12.4 g/dL (ref 12.0–15.0)
MCH: 28.3 pg (ref 26.0–34.0)
MCHC: 29.4 g/dL — ABNORMAL LOW (ref 30.0–36.0)
MCV: 96.3 fL (ref 80.0–100.0)
Platelets: 126 K/uL — ABNORMAL LOW (ref 150–400)
RBC: 4.38 MIL/uL (ref 3.87–5.11)
RDW: 15.6 % — ABNORMAL HIGH (ref 11.5–15.5)
WBC: 21 K/uL — ABNORMAL HIGH (ref 4.0–10.5)
nRBC: 0.1 % (ref 0.0–0.2)

## 2024-06-21 LAB — I-STAT CG4 LACTIC ACID, ED
Lactic Acid, Venous: 6 mmol/L (ref 0.5–1.9)
Lactic Acid, Venous: 7.1 mmol/L (ref 0.5–1.9)

## 2024-06-21 LAB — CBG MONITORING, ED
Glucose-Capillary: 75 mg/dL (ref 70–99)
Glucose-Capillary: 77 mg/dL (ref 70–99)
Glucose-Capillary: 163 mg/dL — ABNORMAL HIGH (ref 70–99)
Glucose-Capillary: 32 mg/dL — CL (ref 70–99)
Glucose-Capillary: 41 mg/dL — CL (ref 70–99)
Glucose-Capillary: 80 mg/dL (ref 70–99)
Glucose-Capillary: 93 mg/dL (ref 70–99)

## 2024-06-21 LAB — URINALYSIS, W/ REFLEX TO CULTURE (INFECTION SUSPECTED)
Bilirubin Urine: NEGATIVE
Glucose, UA: NEGATIVE mg/dL
Ketones, ur: NEGATIVE mg/dL
Leukocytes,Ua: NEGATIVE
Nitrite: NEGATIVE
Protein, ur: 100 mg/dL — AB
Specific Gravity, Urine: 1.011 (ref 1.005–1.030)
pH: 5 (ref 5.0–8.0)

## 2024-06-21 LAB — GLUCOSE, CAPILLARY
Glucose-Capillary: 28 mg/dL — CL (ref 70–99)
Glucose-Capillary: 89 mg/dL (ref 70–99)

## 2024-06-21 LAB — COMPREHENSIVE METABOLIC PANEL WITH GFR
ALT: 109 U/L — ABNORMAL HIGH (ref 0–44)
AST: 170 U/L — ABNORMAL HIGH (ref 15–41)
Albumin: 1.9 g/dL — ABNORMAL LOW (ref 3.5–5.0)
Alkaline Phosphatase: 47 U/L (ref 38–126)
Anion gap: 12 (ref 5–15)
BUN: 42 mg/dL — ABNORMAL HIGH (ref 8–23)
CO2: 18 mmol/L — ABNORMAL LOW (ref 22–32)
Calcium: 7.5 mg/dL — ABNORMAL LOW (ref 8.9–10.3)
Chloride: 107 mmol/L (ref 98–111)
Creatinine, Ser: 3.05 mg/dL — ABNORMAL HIGH (ref 0.44–1.00)
GFR, Estimated: 15 mL/min — ABNORMAL LOW (ref >60)
Glucose, Bld: 69 mg/dL — ABNORMAL LOW (ref 70–99)
Potassium: 4.2 mmol/L (ref 3.5–5.1)
Sodium: 137 mmol/L (ref 135–145)
Total Bilirubin: 0.9 mg/dL (ref 0.0–1.2)
Total Protein: 4.3 g/dL — ABNORMAL LOW (ref 6.5–8.1)

## 2024-06-21 LAB — RESP PANEL BY RT-PCR (RSV, FLU A&B, COVID)  RVPGX2
Influenza A by PCR: NEGATIVE
Influenza B by PCR: NEGATIVE
Resp Syncytial Virus by PCR: NEGATIVE
SARS Coronavirus 2 by RT PCR: NEGATIVE

## 2024-06-21 LAB — PROTIME-INR
INR: 1.4 — ABNORMAL HIGH (ref 0.8–1.2)
Prothrombin Time: 18.3 s — ABNORMAL HIGH (ref 11.4–15.2)

## 2024-06-21 MED ORDER — FENTANYL 2500MCG IN NS 250ML (10MCG/ML) PREMIX INFUSION
0.0000 ug/h | INTRAVENOUS | Status: DC
  Administered 2024-06-21: 50 ug/h via INTRAVENOUS
  Administered 2024-06-22: 150 ug/h via INTRAVENOUS
  Administered 2024-06-22: 100 ug/h via INTRAVENOUS
  Administered 2024-06-23: 200 ug/h via INTRAVENOUS
  Administered 2024-06-24: 225 ug/h via INTRAVENOUS
  Filled 2024-06-21 (×4): qty 250

## 2024-06-21 MED ORDER — HYDROCORTISONE SOD SUC (PF) 100 MG IJ SOLR
100.0000 mg | Freq: Once | INTRAMUSCULAR | Status: AC
  Administered 2024-06-21: 100 mg via INTRAVENOUS
  Filled 2024-06-21: qty 2

## 2024-06-21 MED ORDER — DEXTROSE 50 % IV SOLN
INTRAVENOUS | Status: AC
  Administered 2024-06-21: 25 g via INTRAVENOUS
  Filled 2024-06-21: qty 50

## 2024-06-21 MED ORDER — SODIUM CHLORIDE 0.9 % IV BOLUS
1000.0000 mL | Freq: Once | INTRAVENOUS | Status: AC
  Administered 2024-06-21: 1000 mL via INTRAVENOUS

## 2024-06-21 MED ORDER — ORAL CARE MOUTH RINSE: 15.0000 mL | OROMUCOSAL | Status: DC | PRN

## 2024-06-21 MED ORDER — IPRATROPIUM-ALBUTEROL 0.5-2.5 (3) MG/3ML IN SOLN: 3.0000 mL | RESPIRATORY_TRACT | Status: DC | PRN

## 2024-06-21 MED ORDER — DEXTROSE 50 % IV SOLN: 25.0000 g | Freq: Once | INTRAVENOUS | Status: AC

## 2024-06-21 MED ORDER — NOREPINEPHRINE 4 MG/250ML-% IV SOLN
0.0000 ug/min | INTRAVENOUS | Status: DC
  Filled 2024-06-21: qty 250

## 2024-06-21 MED ORDER — SODIUM BICARBONATE 8.4 % IV SOLN
INTRAVENOUS | Status: AC
  Filled 2024-06-21: qty 50

## 2024-06-21 MED ORDER — SODIUM CHLORIDE 0.9 % IV SOLN: 2.0000 g | INTRAVENOUS | Status: DC

## 2024-06-21 MED ORDER — DOCUSATE SODIUM 50 MG/5ML PO LIQD: 100.0000 mg | Freq: Two times a day (BID) | ORAL | Status: DC | PRN

## 2024-06-21 MED ORDER — ORAL CARE MOUTH RINSE
15.0000 mL | OROMUCOSAL | Status: DC
  Administered 2024-06-22 – 2024-06-24 (×31): 15 mL via OROMUCOSAL

## 2024-06-21 MED ORDER — ROCURONIUM BROMIDE 10 MG/ML (PF) SYRINGE: 100.0000 mg | PREFILLED_SYRINGE | Freq: Once | INTRAVENOUS | Status: AC

## 2024-06-21 MED ORDER — NOREPINEPHRINE 4 MG/250ML-% IV SOLN
INTRAVENOUS | Status: AC
  Administered 2024-06-21: 5 ug/min via INTRAVENOUS
  Filled 2024-06-21: qty 250

## 2024-06-21 MED ORDER — SODIUM CHLORIDE 0.9 % IV SOLN
500.0000 mg | Freq: Once | INTRAVENOUS | Status: AC
Start: 2024-06-21 — End: 2024-06-21
  Administered 2024-06-21: 500 mg via INTRAVENOUS
  Filled 2024-06-21: qty 5

## 2024-06-21 MED ORDER — HEPARIN SODIUM (PORCINE) 5000 UNIT/ML IJ SOLN
5000.0000 [IU] | Freq: Three times a day (TID) | INTRAMUSCULAR | Status: DC
  Administered 2024-06-21 – 2024-06-22 (×2): 5000 [IU] via SUBCUTANEOUS
  Filled 2024-06-21 (×2): qty 1

## 2024-06-21 MED ORDER — HYDROCORTISONE SOD SUC (PF) 100 MG IJ SOLR
100.0000 mg | Freq: Three times a day (TID) | INTRAMUSCULAR | Status: DC
  Administered 2024-06-21 – 2024-06-23 (×5): 100 mg via INTRAVENOUS
  Filled 2024-06-21 (×5): qty 2

## 2024-06-21 MED ORDER — BUDESONIDE 0.5 MG/2ML IN SUSP
0.5000 mg | Freq: Two times a day (BID) | RESPIRATORY_TRACT | Status: DC
  Administered 2024-06-21 – 2024-06-24 (×6): 0.5 mg via RESPIRATORY_TRACT
  Filled 2024-06-21 (×6): qty 2

## 2024-06-21 MED ORDER — SODIUM CHLORIDE 0.9 % IV SOLN
500.0000 mg | INTRAVENOUS | Status: DC
  Administered 2024-06-22 – 2024-06-23 (×2): 500 mg via INTRAVENOUS
  Filled 2024-06-21 (×2): qty 5

## 2024-06-21 MED ORDER — SODIUM CHLORIDE 0.9% FLUSH
3.0000 mL | Freq: Two times a day (BID) | INTRAVENOUS | Status: DC
  Administered 2024-06-21 – 2024-06-24 (×6): 3 mL via INTRAVENOUS

## 2024-06-21 MED ORDER — ARFORMOTEROL TARTRATE 15 MCG/2ML IN NEBU
15.0000 ug | INHALATION_SOLUTION | Freq: Two times a day (BID) | RESPIRATORY_TRACT | Status: DC
  Administered 2024-06-21 – 2024-06-22 (×2): 15 ug via RESPIRATORY_TRACT
  Filled 2024-06-21 (×2): qty 2

## 2024-06-21 MED ORDER — MECLIZINE HCL 25 MG PO TABS: 50.0000 mg | ORAL_TABLET | Freq: Once | ORAL | Status: DC

## 2024-06-21 MED ORDER — KETAMINE HCL 50 MG/5ML IJ SOSY: 100.0000 mg | PREFILLED_SYRINGE | Freq: Once | INTRAMUSCULAR | Status: AC

## 2024-06-21 MED ORDER — DEXTROSE 50 % IV SOLN
INTRAVENOUS | Status: AC
  Administered 2024-06-21: 50 mL
  Filled 2024-06-21: qty 50

## 2024-06-21 MED ORDER — DEXTROSE 50 % IV SOLN
1.0000 | Freq: Once | INTRAVENOUS | Status: AC
  Administered 2024-06-21: 50 mL via INTRAVENOUS
  Filled 2024-06-21: qty 50

## 2024-06-21 MED ORDER — DEXAMETHASONE SODIUM PHOSPHATE 10 MG/ML IJ SOLN: 100.0000 mg | Freq: Three times a day (TID) | INTRAMUSCULAR | Status: DC

## 2024-06-21 MED ORDER — EPINEPHRINE HCL 5 MG/250ML IV SOLN IN NS
0.5000 ug/min | INTRAVENOUS | Status: DC
  Administered 2024-06-21: 10 ug/min via INTRAVENOUS
  Administered 2024-06-22: 9 ug/min via INTRAVENOUS
  Filled 2024-06-21: qty 250

## 2024-06-21 MED ORDER — POLYETHYLENE GLYCOL 3350 17 G PO PACK: 17.0000 g | PACK | Freq: Every day | ORAL | Status: DC | PRN

## 2024-06-21 MED ORDER — FAMOTIDINE 20 MG PO TABS
20.0000 mg | ORAL_TABLET | Freq: Two times a day (BID) | ORAL | Status: DC
  Administered 2024-06-22 (×2): 20 mg
  Filled 2024-06-21 (×2): qty 1

## 2024-06-21 MED ORDER — SODIUM BICARBONATE 8.4 % IV SOLN
100.0000 meq | Freq: Once | INTRAVENOUS | Status: AC
  Administered 2024-06-21: 100 meq via INTRAVENOUS
  Filled 2024-06-21: qty 50

## 2024-06-21 MED ORDER — SODIUM CHLORIDE 0.9 % IV BOLUS
500.0000 mL | Freq: Once | INTRAVENOUS | Status: AC
  Administered 2024-06-21: 500 mL via INTRAVENOUS

## 2024-06-21 MED ORDER — POTASSIUM CHLORIDE 2 MEQ/ML IV SOLN
INTRAVENOUS | Status: DC
  Filled 2024-06-21 (×2): qty 1000

## 2024-06-21 MED ORDER — FENTANYL CITRATE PF 50 MCG/ML IJ SOSY
50.0000 ug | PREFILLED_SYRINGE | Freq: Once | INTRAMUSCULAR | Status: AC
  Administered 2024-06-21: 50 ug via INTRAVENOUS
  Filled 2024-06-21: qty 1

## 2024-06-21 MED ORDER — LEVOTHYROXINE SODIUM 112 MCG PO TABS
112.0000 ug | ORAL_TABLET | Freq: Every day | ORAL | Status: DC
  Administered 2024-06-22 – 2024-06-24 (×3): 112 ug
  Filled 2024-06-21 (×3): qty 1

## 2024-06-21 MED ORDER — POLYETHYLENE GLYCOL 3350 17 G PO PACK
17.0000 g | PACK | Freq: Every day | ORAL | Status: DC
  Administered 2024-06-22 – 2024-06-23 (×2): 17 g
  Filled 2024-06-21 (×2): qty 1

## 2024-06-21 MED ORDER — FENTANYL BOLUS VIA INFUSION
25.0000 ug | INTRAVENOUS | Status: DC | PRN
  Administered 2024-06-22 – 2024-06-24 (×11): 100 ug via INTRAVENOUS

## 2024-06-21 MED ORDER — LACTATED RINGERS IV BOLUS
1000.0000 mL | Freq: Once | INTRAVENOUS | Status: AC
  Administered 2024-06-21: 1000 mL via INTRAVENOUS

## 2024-06-21 MED ORDER — KETAMINE HCL 50 MG/5ML IJ SOSY
PREFILLED_SYRINGE | INTRAMUSCULAR | Status: AC
  Administered 2024-06-21: 100 mg via INTRAVENOUS
  Filled 2024-06-21: qty 10

## 2024-06-21 MED ORDER — ROCURONIUM BROMIDE 10 MG/ML (PF) SYRINGE
PREFILLED_SYRINGE | INTRAVENOUS | Status: AC
  Administered 2024-06-21: 100 mg via INTRAVENOUS
  Filled 2024-06-21: qty 10

## 2024-06-21 MED ORDER — SODIUM CHLORIDE 0.9 % IV SOLN
1.0000 g | Freq: Once | INTRAVENOUS | Status: AC
  Administered 2024-06-21: 1 g via INTRAVENOUS
  Filled 2024-06-21: qty 10

## 2024-06-21 MED ORDER — EPINEPHRINE 0.1 MG/10ML (10 MCG/ML) SYRINGE FOR IV PUSH (FOR BLOOD PRESSURE SUPPORT): 40.0000 ug | PREFILLED_SYRINGE | Freq: Once | INTRAVENOUS | Status: DC | PRN

## 2024-06-21 MED ORDER — DOCUSATE SODIUM 50 MG/5ML PO LIQD
100.0000 mg | Freq: Two times a day (BID) | ORAL | Status: DC
  Administered 2024-06-22 – 2024-06-23 (×4): 100 mg
  Filled 2024-06-21 (×5): qty 10

## 2024-06-21 NOTE — Procedures (Signed)
 Central Venous Catheter Insertion Procedure Note  Miranda Cohen  [id number]  [birth year]  Date:06/21/24  Time:11:53 PM   Provider Performing:Lirio Bach D Emilio   Procedure: Insertion of Non-tunneled Central Venous Catheter(36556) with US  guidance (23062)   Indication(s) Medication administration  Consent Risks of the procedure as well as the alternatives and risks of each were explained to the patient and/or caregiver.  Consent for the procedure was obtained and is signed in the bedside chart  Anesthesia Topical only with 1% lidocaine    Timeout Verified patient identification, verified procedure, site/side was marked, verified correct patient position, special equipment/implants available, medications/allergies/relevant history reviewed, required imaging and test results available.  Sterile Technique Maximal sterile technique including full sterile barrier drape, hand hygiene, sterile gown, sterile gloves, mask, hair covering, sterile ultrasound probe cover (if used).  Procedure Description Area of catheter insertion was cleaned with chlorhexidine  and draped in sterile fashion.  With real-time ultrasound guidance a central venous catheter was placed into the left internal jugular vein. Nonpulsatile blood flow and easy flushing noted in all ports.  The catheter was sutured in place and sterile dressing applied.  Complications/Tolerance None; patient tolerated the procedure well. Chest X-ray is ordered to verify placement for internal jugular or subclavian cannulation.   Chest x-ray is not ordered for femoral cannulation.  EBL Minimal  Specimen(s) None  JD Emilio RIGGERS [place] 06/21/2024, 11:54 PM  Please see [email or web address] for pager details.  From 7A-7P if no response, please call [phone]. After hours, please call ELink [phone].

## 2024-06-21 NOTE — ED Notes (Signed)
 RN x2 and phlebotomy unable to obtain blood cultures. DO aware.

## 2024-06-21 NOTE — H&P (Signed)
 NAME:  Miranda Cohen, MRN:  [id number], DOB:  Aug 04, 1946, LOS: 0 ADMISSION DATE:  06/21/2024, CONSULTATION DATE:  7/21 REFERRING MD:  Dr. Pamella, CHIEF COMPLAINT:  shock presume septic   History of Present Illness:  Patient is a 78 yo F w/ pertinent PMH tobacco abuse, emphysema, previous PE 2021 on warfarin for 1 year, hypothyroidism, PAH, PAD presents to [facility] on 7/21 w/ AMS.  Patient recently seen by ED on 7/11 for right ankle pain/swelling. Dvt us  negative. Xray showing small bone fragment possible avulsion fracture. Sent home w/ pain meds and ankle wrapped. F/u w/ ortho on 7/14. Given prednisone  taper for 10 days.   On 7/21 patient having acute N/V/Diarrhea and abd pain in am. Per patient's daughter patient took her steroid taper much quicker than was prescribed. EMS called and cbg 30 given dextrose . Brought to [facility] ED. Placed on 4L [place] sats 90%. Patient tachypneic 20-30s w/ congested cough. Patient bp initially stable but became hypotensive. Temp 56F and wbc 21. Cultures obtained and given iv fluids. CXR w/ bibasilar disease and left pleural effusion. Started on rocephin /azithro. Covid/flu negative. LA 6 then 7.1. UA w/ rare bacteria. Despite iv fluids patient remained hypotensive requiring levo. Patient with increasing sob and having becoming more altered/confused. Placed on bipap but unable to tolerate. Patient intubated. PCCM consulted for icu admission.  Pertinent ED labs: Glucose 69, creat 3.05, ast 170, alt 109, platelets 126  Pertinent  Medical History   Past Medical History:  Diagnosis Date   Acute metabolic encephalopathy    Aortic atherosclerosis (HCC) 08/18/2020   Aortic atherosclerosis (HCC)    Arthritis    Chronic lymphocytic thyroiditis 08/18/2020   Coronary artery calcification    Coronary atherosclerosis    Dyspnea    Emphysema lung (HCC) 08/18/2020   History of pulmonary embolism    Hypothyroidism    Myxedema coma (HCC)    PAD (peripheral artery disease) (HCC)     Pericardial effusion    Pulmonary arterial hypertension (HCC)    Pulmonary embolism (HCC) 08/2020   Sepsis (HCC)    Wears dentures    full upper and lower   '  Significant Hospital Events: Including procedures, antibiotic start and stop dates in addition to other pertinent events   7/21 admitted for ams and shock; worsening resp failure now intubated  Interim History / Subjective:  See above  Objective    Blood pressure (!) 133/58, pulse (!) 102, temperature 99 F (37.2 C), temperature source Axillary, resp. rate 19, SpO2 100%.    Vent Mode: BIPAP FiO2 (%):  [40 %] 40 % Set Rate:  [15 bmp] 15 bmp   Intake/Output Summary (Last 24 hours) at 06/21/2024 2100 Last data filed at 06/21/2024 1939 Gross per 24 hour  Intake 2830.49 ml  Output --  Net 2830.49 ml   There were no vitals filed for this visit.  Examination: General: critically ill appearing on bipap HEENT: MM pink/moist; bipap in place Neuro: patient confused; move all ext spontaneously; perrl CV: s1s2, RRR, no m/r/g PULM:  coarse rhonchi BS bilaterally; more diminished on left; bipap GI: soft, bs diminished; diffuse abd pain on palpation Extremities: warm/dry, no edema     Resolved problem list   Assessment and Plan   Shock: presume septic 2/2 pna; could be GI source with having diarrhea?; also concern for adrenal insufficiency due to recent steroid misuse Plan: -wean levo for map goal >65 -will need cvl -consider more iv fluids -trend LA -stress dose steroids -  CT chest/abd/pelvis pending -rocephin /azithro for possible cap -follow cultures; send urine legionella/strep, rvp -echo  Acute respiratory failure w/ hypercapnia Possible CAP Hx of emphysema PAH Plan: -intubated in ED due to worsening respiratory distress on bipap -LTVV strategy with tidal volumes of 6-8 cc/kg ideal body weight -abg w/ resp acidosis; 2 amps bicarb and increase rr; repeat in few hours -Wean PEEP/FiO2 for SpO2 >92% -VAP  bundle in place -Daily SAT and SBT -PAD protocol in place -wean sedation for RASS goal 0 to -1 -trach aspirate -rocephin [place] for possible cap -pulmicort /brovana ; prn duoneb  Acute encephalopathy: presume 2/2 to hypoglycemia and sepsis; also recently received pain meds for right ankle pain Plan: -ct head  -treat sepsis and hypoglycemia -limit sedating meds -check ammonia, tsh, uds, ethanol  Hypoglycemia: concern for adrenal insufficiency Plan: -check A1c -cbg monitoring -stress dose steroids  Abd pain N/V/D Plan: -f/u ct abd/pelvis -lipase  AKI on CKD 3a Plan: -given iv fluids -Trend BMP / urinary output -Replace electrolytes as indicated -Avoid nephrotoxic agents, ensure adequate renal perfusion  Thrombocytopenia -likely sepsis related Plan: -trend cbc  Elevated LFTs Plan: -ct abd/pelvis pending -trend cmp  Hx of hypothyroidism Plan: -check tsh -resume home synthroid   PAD Plan: -hold home statin for now w/ elevated LFTs  Best Practice (right click and Reselect all SmartList Selections daily)   Diet/type: NPO w/ meds via tube DVT prophylaxis prophylactic heparin   Pressure ulcer(s): pressure ulcer assessment deferred  GI prophylaxis: H2B Lines: Central line Foley:  N/A Code Status:  full code Last date of multidisciplinary goals of care discussion [7/21 daughter updated at bedside]  Labs   CBC: Recent Labs  Lab 06/21/24 1622  WBC 21.0*  HGB 12.4  HCT 42.2  MCV 96.3  PLT 126*    Basic Metabolic Panel: Recent Labs  Lab 06/21/24 1525  NA 137  K 4.2  CL 107  CO2 18*  GLUCOSE 69*  BUN 42*  CREATININE 3.05*  CALCIUM  7.5*   GFR: CrCl cannot be calculated (Unknown ideal weight.). Recent Labs  Lab 06/21/24 1535 06/21/24 1622 06/21/24 1750  WBC  --  21.0*  --   LATICACIDVEN 6.0*  --  7.1*    Liver Function Tests: Recent Labs  Lab 06/21/24 1525  AST 170*  ALT 109*  ALKPHOS 47  BILITOT 0.9  PROT 4.3*  ALBUMIN  1.9*    No results for input(s): LIPASE, AMYLASE in the last 168 hours. No results for input(s): AMMONIA in the last 168 hours.  ABG    Component Value Date/Time   PHART 7.419 08/08/2020 0853   PCO2ART 47.2 08/08/2020 0853   PO2ART 96 08/08/2020 0853   HCO3 30.9 (H) 08/08/2020 0853   TCO2 32 08/08/2020 0853   O2SAT 98.0 08/08/2020 0853     Coagulation Profile: Recent Labs  Lab 06/21/24 1525  INR 1.4*    Cardiac Enzymes: No results for input(s): CKTOTAL, CKMB, CKMBINDEX, TROPONINI in the last 168 hours.  HbA1C: Hgb A1c MFr Bld  Date/Time Value Ref Range Status  08/06/2020 07:00 AM 6.0 (H) 4.8 - 5.6 % Final    Comment:    (NOTE) Pre diabetes:          5.7%-6.4%  Diabetes:              >6.4%  Glycemic control for   <7.0% adults with diabetes     CBG: Recent Labs  Lab 06/21/24 1624 06/21/24 1745 06/21/24 1824 06/21/24 1908 06/21/24 1958  GLUCAP 163* 41* 32* 80 93  Review of Systems:   Patient is encephalopathic; therefore, history has been obtained from chart review.    Past Medical History:  She,  has a past medical history of Acute metabolic encephalopathy, Aortic atherosclerosis (HCC) (08/18/2020), Aortic atherosclerosis (HCC), Arthritis, Chronic lymphocytic thyroiditis (08/18/2020), Coronary artery calcification, Coronary atherosclerosis, Dyspnea, Emphysema lung (HCC) (08/18/2020), History of pulmonary embolism, Hypothyroidism, Myxedema coma (HCC), PAD (peripheral artery disease) (HCC), Pericardial effusion, Pulmonary arterial hypertension (HCC), Pulmonary embolism (HCC) (08/2020), Sepsis (HCC), and Wears dentures.   Surgical History:   Past Surgical History:  Procedure Laterality Date   CATARACT EXTRACTION W/PHACO Right 08/19/2022   Procedure: CATARACT EXTRACTION PHACO AND INTRAOCULAR LENS PLACEMENT (IOC) RIGHT;  Surgeon: Myrna Adine Anes, MD;  Location: [facility];  Service: Ophthalmology;  Laterality: Right;  10.29 1:00.3    CATARACT EXTRACTION W/PHACO Left 10/20/2023   Procedure: CATARACT EXTRACTION PHACO AND INTRAOCULAR LENS PLACEMENT (IOC) LEFT 10.80 01:06.3;  Surgeon: Myrna Adine Anes, MD;  Location: [facility];  Service: Ophthalmology;  Laterality: Left;   CYST EXCISION Left 01/28/2024   Procedure: CYST REMOVAL;  Surgeon: Tye Millet, DO;  Location: [facility];  Service: General;  Laterality: Left;   TUBAL LIGATION       Social History:   reports that she has been smoking cigarettes. She started smoking about 62 years ago. She has a 61.6 pack-year smoking history. She has never used smokeless tobacco. She reports that she does not drink alcohol  and does not use drugs.   Family History:  Her family history is not on file.   Allergies No Known Allergies   Home Medications  Prior to Admission medications   Medication Sig Start Date End Date Taking? Authorizing Provider  atorvastatin (LIPITOR) 20 MG tablet Take 20 mg by mouth daily. 12/16/23 12/15/24 Yes [provider]  diclofenac Sodium (VOLTAREN) 1 % GEL Apply 2 g topically daily as needed (Pain). 12/16/23  Yes [provider]  furosemide  (LASIX ) 20 MG tablet Take 20 mg by mouth daily as needed for edema.   Yes [provider]  HYDROcodone-acetaminophen  (NORCO/VICODIN) 5-325 MG tablet Take 1 tablet by mouth 4 (four) times daily as needed (for pain).   Yes [provider]  levothyroxine  (SYNTHROID ) 112 MCG tablet Take 112 mcg by mouth daily before breakfast. 02/12/22  Yes [provider]  mometasone -formoterol  (DULERA ) 200-5 MCG/ACT AERO Inhale 2 puffs into the lungs 2 (two) times daily.   Yes [provider]  OXYGEN Inhale 4 L/min into the lungs continuous.   Yes [provider]  Cholecalciferol (VITAMIN D3) 75 MCG (3000 UT) TABS Take 3,000 mcg by mouth in the morning. Patient not taking: Reported on 06/21/2024 01/28/24   Sakai, Isami, DO  cyanocobalamin (VITAMIN B12) 1000 MCG tablet Take 3  tablets (3,000 mcg total) by mouth in the morning. Patient not taking: Reported on 06/21/2024 01/28/24   Sakai, Isami, DO  traMADol  (ULTRAM ) 50 MG tablet Take 1 tablet (50 mg total) by mouth every 6 (six) hours as needed for up to 8 doses. Patient not taking: Reported on 06/21/2024 01/28/24   Sakai, Isami, DO     Critical care time: 45 minutes      JD Emilio RIGGERS [place] [facility] 06/21/2024, 9:01 PM  Please see [email or web address] for pager details.  From 7A-7P if no response, please call [phone]. After hours, please call ELink [phone].

## 2024-06-21 NOTE — ED Triage Notes (Signed)
 Pt bib EMS from home for complaint of AMS and hypoglycemia. Per family patient LKW was around 2200 last night. Pt was complaining of N/V/D and chills yesterday. Woke up this morning confused. Per EMS pt was taking prednisone  for ankle injury but may have taken course too quickly. CBG on EMS arrival was 31. Pt received 25g of D10 PTA. Last CBG for EMS was 81. Pt wears 4L [place] at baseline.   Vitals for EMS 100.56F, 35 RR, 120 HR, 110/80. Pt received 500mL NS and 700mL LR PTA. 20g L AC, and 18g R AC.   CBG 49 on arrival at 1415. Additional 25g of dextrose  given emergently for hypoglycemia at 1420.

## 2024-06-21 NOTE — ED Provider Notes (Signed)
 Physical Exam  BP 97/66   Pulse (!) 110   Temp (!) 101.8 F (38.8 C)   Resp 20   Wt 88.9 kg   SpO2 96%   BMI 32.61 kg/m   Physical Exam Vitals and nursing note reviewed.  HENT:     Head: Normocephalic and atraumatic.  Eyes:     Pupils: Pupils are equal, round, and reactive to light.  Cardiovascular:     Rate and Rhythm: Normal rate and regular rhythm.  Pulmonary:     Effort: Pulmonary effort is normal.     Breath sounds: Normal breath sounds.  Abdominal:     Palpations: Abdomen is soft.     Tenderness: There is no abdominal tenderness.  Skin:    General: Skin is warm and dry.  Neurological:     Mental Status: She is alert.  Psychiatric:        Mood and Affect: Mood normal.     Procedures  .Critical Care  Performed by: Pamella Ozell LABOR, DO Authorized by: Pamella Ozell LABOR, DO   Critical care provider statement:    Critical care time (minutes):  90   Critical care time was exclusive of:  Separately billable procedures and treating other patients and teaching time   Critical care was necessary to treat or prevent imminent or life-threatening deterioration of the following conditions:  Sepsis, shock and respiratory failure   Critical care was time spent personally by me on the following activities:  Development of treatment plan with patient or surrogate, discussions with consultants, evaluation of patient's response to treatment, examination of patient, ordering and review of laboratory studies, ordering and review of radiographic studies, ordering and performing treatments and interventions, pulse oximetry, re-evaluation of patient's condition and review of old charts   I assumed direction of critical care for this patient from another provider in my specialty: no     Care discussed with: admitting provider   Procedure Name: Intubation Date/Time: 06/21/2024 10:00 PM  Performed by: Pamella Ozell LABOR, DOPre-anesthesia Checklist: Patient identified, Patient being monitored,  Emergency Drugs available, Timeout performed and Suction available Oxygen Delivery Method: Non-rebreather mask Preoxygenation: Pre-oxygenation with 100% oxygen Induction Type: Rapid sequence Ventilation: Mask ventilation without difficulty Laryngoscope Size: Glidescope and 4 Grade View: Grade I Tube size: 7.5 mm Number of attempts: 1 Airway Equipment and Method: Video-laryngoscopy Placement Confirmation: ETT inserted through vocal cords under direct vision, CO2 detector and Breath sounds checked- equal and bilateral Secured at: 23 cm Tube secured with: ETT holder      ED Course / MDM   Clinical Course as of 06/21/24 2355  Mon Jun 21, 2024  1446 RN reporting temp 100.50F on arrival.  Pt drinking juice now [MT]  1754 Recurrent hypoglycemia.  Provided another amp of D50.  Lactic acid uptrending now to 7.1.  MAP has remained above 65.  I ordered another 500 cc IV fluid to complete 30 cc/kg bolus.  Will continue to monitor her hemodynamic status closely and start Levophed  if pressures are downtrending.  Patient reporting significant abdominal pain.  Source of sepsis may be respiratory versus intra-abdominal versus urinary.  Awaiting CT chest abdomen pelvis without contrast given GFR 15. [MP]  2130 Despite 30 cc/kg fluid resuscitation, patient did become hypotensive and required initiation of vasopressor support with Levophed .  Her mental status also slowly declined.  We trialed her on BiPAP for short while which she was able to tolerate with some redirection but given concern for worsening mental status, respiratory status  and severe sepsis the decision was made with the patient and her daughter to proceed with intubation.  I have discussed with Dr. Layman (ICU) who has evaluated patient in the ED and accepts patient for admission to the ICU.  ICU team will obtain central access upstairs. [MP]  2200 Are signed to patient with ketamine  and rocuronium .  Following intubation patient did have a  brief episode of hypotension.  Gave her 1 dose of epinephrine  and started epinephrine  IV which quickly improved her heart rate and blood pressure.  CT chest abdomen pelvis still pending.  Remainder of care per ICU team [MP]    Clinical Course User Index [MP] Pamella Ozell LABOR, DO [MT] Cottie Donnice PARAS, MD   Medical Decision Making I, Ozell Pamella DO, have assumed care of this patient from the previous provider.  In brief, 78 year old female here given concern for acute infection.  Meeting SIRS criteria.  Initial evaluation also notable for hypoglycemia.  No prior history of insulin  use, diabetes or hypoglycemic episodes.  Signed out pending infectious workup reevaluation and likely admission  Amount and/or Complexity of Data Reviewed Labs: ordered. Radiology: ordered. ECG/medicine tests: ordered.  Risk Prescription drug management. Decision regarding hospitalization.          Pamella Ozell LABOR, DO 06/21/24 2355

## 2024-06-21 NOTE — Progress Notes (Signed)
Pt. Placed on bipap per MD.

## 2024-06-21 NOTE — Progress Notes (Addendum)
 Per MD, ett tube pulled back to 20 cm at lip

## 2024-06-21 NOTE — ED Provider Notes (Signed)
 [place] EMERGENCY DEPARTMENT AT [facility] Provider Note   CSN: [id number] Arrival date & time: 06/21/24  1418     Patient presents with: Hypoglycemia   Miranda Cohen is a 78 y.o. female here with nausea/vomiting/diarrhea/weakness starting this morning while in bed.  Feels weak.  BS low with EMS 30 originally, given dextrose  to 90.  Pt reports eating yesterday, not today. Denies hx of diabetes and does not take insulin .  Her daughter says patient has been treated recently with steroids for inflammation all over her body but has not been on antibiotics.  Productive cough noted.    HPI     Prior to Admission medications   Medication Sig Start Date End Date Taking? Authorizing Provider  atorvastatin (LIPITOR) 20 MG tablet Take 20 mg by mouth daily. 12/16/23 12/15/24  [provider]  Cholecalciferol (VITAMIN D3) 75 MCG (3000 UT) TABS Take 3,000 mcg by mouth in the morning. 01/28/24   Sakai, Isami, DO  cyanocobalamin (VITAMIN B12) 1000 MCG tablet Take 3 tablets (3,000 mcg total) by mouth in the morning. 01/28/24   Tye, Isami, DO  diclofenac Sodium (VOLTAREN) 1 % GEL Apply 2 g topically daily as needed (Pain). 12/16/23   [provider]  gabapentin (NEURONTIN) 300 MG capsule Take 600 mg by mouth 2 (two) times daily.    [provider]  levothyroxine  (SYNTHROID ) 112 MCG tablet Take 112 mcg by mouth daily before breakfast. 02/12/22   [provider]  mometasone -formoterol  (DULERA ) 200-5 MCG/ACT AERO Inhale 2 puffs into the lungs daily as needed for shortness of breath.    [provider]  Multiple Vitamins-Minerals (PRESERVISION AREDS PO) Take 2 tablets by mouth daily.    [provider]  traMADol  (ULTRAM ) 50 MG tablet Take 1 tablet (50 mg total) by mouth every 6 (six) hours as needed for up to 8 doses. 01/28/24   Sakai, Isami, DO    Allergies: Patient has no known allergies.    Review of Systems  Updated Vital  Signs BP 111/86   Pulse 87   Resp (!) 31   SpO2 92%   Physical Exam Constitutional:      General: She is not in acute distress. HENT:     Head: Normocephalic and atraumatic.  Eyes:     Conjunctiva/sclera: Conjunctivae normal.     Pupils: Pupils are equal, round, and reactive to light.  Cardiovascular:     Rate and Rhythm: Normal rate and regular rhythm.  Pulmonary:     Effort: Pulmonary effort is normal. No respiratory distress.     Comments: 4L [place] at 90% O2, poor pleth Productive coughing Abdominal:     General: There is no distension.     Tenderness: There is no abdominal tenderness.  Skin:    General: Skin is warm and dry.  Neurological:     General: No focal deficit present.     Mental Status: She is alert. Mental status is at baseline.  Psychiatric:        Mood and Affect: Mood normal.        Behavior: Behavior normal.     (all labs ordered are listed, but only abnormal results are displayed) Labs Reviewed  CULTURE, BLOOD (ROUTINE X 2)  CULTURE, BLOOD (ROUTINE X 2)  RESP PANEL BY RT-PCR (RSV, FLU A&B, COVID)  RVPGX2  CBC  COMPREHENSIVE METABOLIC PANEL WITH GFR  PROTIME-INR  URINALYSIS, W/ REFLEX TO CULTURE (INFECTION SUSPECTED)  CBG MONITORING, ED  CBG MONITORING, ED  I-STAT CG4 LACTIC ACID, ED  CBG MONITORING, ED    EKG: None  Radiology: No results found.   Procedures   Medications Ordered in the ED  meclizine  (ANTIVERT ) tablet 50 mg (has no administration in time range)  sodium chloride  flush (NS) 0.9 % injection 3 mL (3 mLs Intravenous Given 06/21/24 1448)  sodium chloride  0.9 % bolus 1,000 mL (has no administration in time range)    Clinical Course as of 06/21/24 1451  Mon Jun 21, 2024  1446 RN reporting temp 100.49F on arrival.  Pt drinking juice now [MT]    Clinical Course User Index [MT] [name], Miranda PARAS, MD                                 Medical Decision Making Amount and/or Complexity of Data Reviewed Labs: ordered. Radiology:  ordered. ECG/medicine tests: ordered.   This patient presents to the ED with concern for hypoglycemia, weakness, diarrhea, cough. This involves an extensive number of treatment options, and is a complaint that carries with it a high risk of complications and morbidity.  The differential diagnosis includes infection/PNA vs sepsis vs adrenal insuffiencey vs other  Additional history obtained from daughter, EMS   I ordered and personally interpreted labs.  The pertinent results include:  glucose 77 , labs pending  Patient is signed out to Dr Pamella EDP pending labs, sepsis workup, imaging, glucose monitoring.  BP stable.      Final diagnoses:  None    ED Discharge Orders     None          Cottie Miranda PARAS, MD 06/21/24 [phone]

## 2024-06-21 NOTE — ED Notes (Signed)
 6oz apple juice given for CBG 77 per MD.

## 2024-06-21 NOTE — Progress Notes (Signed)
 eLink Physician-Brief Progress Note Patient Name: Miranda Cohen DOB: 10-20-1946 MRN: [id number]   Date of Service  06/21/2024  HPI/Events of Note  4 F hx of COPD, PE in 2021 now off warfarin, PAD, hypothyroidism. Recent ED visit for possible ankle fracture prescribed pain meds and prednisone . She presented this time after EMS was called for abdominal pain, nausea and vomiting. She was in distress and hypotensive. Now intubated after failed BiPap and started on pressors after fluids.  Seen on camera, CCM team at bedside ongoing central line insertion  eICU Interventions  Septic shock secondary to pneumonia. Started on antibiotics as well as stress dose steroids.        [name] 06/21/2024, 11:41 PM

## 2024-06-22 ENCOUNTER — Inpatient Hospital Stay (HOSPITAL_COMMUNITY)

## 2024-06-22 DIAGNOSIS — J9601 Acute respiratory failure with hypoxia: Secondary | ICD-10-CM | POA: Diagnosis not present

## 2024-06-22 DIAGNOSIS — J9602 Acute respiratory failure with hypercapnia: Secondary | ICD-10-CM | POA: Diagnosis not present

## 2024-06-22 DIAGNOSIS — R578 Other shock: Secondary | ICD-10-CM | POA: Diagnosis not present

## 2024-06-22 DIAGNOSIS — R6521 Severe sepsis with septic shock: Secondary | ICD-10-CM | POA: Diagnosis not present

## 2024-06-22 DIAGNOSIS — N1831 Chronic kidney disease, stage 3a: Secondary | ICD-10-CM

## 2024-06-22 DIAGNOSIS — E039 Hypothyroidism, unspecified: Secondary | ICD-10-CM

## 2024-06-22 DIAGNOSIS — N179 Acute kidney failure, unspecified: Secondary | ICD-10-CM

## 2024-06-22 DIAGNOSIS — A419 Sepsis, unspecified organism: Secondary | ICD-10-CM

## 2024-06-22 DIAGNOSIS — R7401 Elevation of levels of liver transaminase levels: Secondary | ICD-10-CM

## 2024-06-22 DIAGNOSIS — E162 Hypoglycemia, unspecified: Secondary | ICD-10-CM

## 2024-06-22 LAB — POCT I-STAT EG7
Acid-base deficit: 18 mmol/L — ABNORMAL HIGH (ref 0.0–2.0)
Bicarbonate: 15.7 mmol/L — ABNORMAL LOW (ref 20.0–28.0)
Calcium, Ion: 1.01 mmol/L — ABNORMAL LOW (ref 1.15–1.40)
HCT: 50 % — ABNORMAL HIGH (ref 36.0–46.0)
Hemoglobin: 17 g/dL — ABNORMAL HIGH (ref 12.0–15.0)
O2 Saturation: 44 %
Patient temperature: 38.3
Potassium: 5.3 mmol/L — ABNORMAL HIGH (ref 3.5–5.1)
Sodium: 133 mmol/L — ABNORMAL LOW (ref 135–145)
TCO2: 18 mmol/L — ABNORMAL LOW (ref 22–32)
pCO2, Ven: 75.7 mmHg (ref 44–60)
pH, Ven: 6.935 — CL (ref 7.25–7.43)
pO2, Ven: 43 mmHg (ref 32–45)

## 2024-06-22 LAB — C-REACTIVE PROTEIN: CRP: 23.2 mg/dL — ABNORMAL HIGH (ref <1.0)

## 2024-06-22 LAB — POCT I-STAT 7, (LYTES, BLD GAS, ICA,H+H)
Acid-base deficit: 19 mmol/L — ABNORMAL HIGH (ref 0.0–2.0)
Acid-base deficit: 16 mmol/L — ABNORMAL HIGH (ref 0.0–2.0)
Acid-base deficit: 13 mmol/L — ABNORMAL HIGH (ref 0.0–2.0)
Acid-base deficit: 9 mmol/L — ABNORMAL HIGH (ref 0.0–2.0)
Bicarbonate: 10.7 mmol/L — ABNORMAL LOW (ref 20.0–28.0)
Bicarbonate: 12.4 mmol/L — ABNORMAL LOW (ref 20.0–28.0)
Bicarbonate: 14 mmol/L — ABNORMAL LOW (ref 20.0–28.0)
Bicarbonate: 18.6 mmol/L — ABNORMAL LOW (ref 20.0–28.0)
Calcium, Ion: 0.98 mmol/L — ABNORMAL LOW (ref 1.15–1.40)
Calcium, Ion: 0.98 mmol/L — ABNORMAL LOW (ref 1.15–1.40)
Calcium, Ion: 1.03 mmol/L — ABNORMAL LOW (ref 1.15–1.40)
Calcium, Ion: 0.97 mmol/L — ABNORMAL LOW (ref 1.15–1.40)
HCT: 50 % — ABNORMAL HIGH (ref 36.0–46.0)
HCT: 39 % (ref 36.0–46.0)
HCT: 37 % (ref 36.0–46.0)
HCT: 40 % (ref 36.0–46.0)
Hemoglobin: 17 g/dL — ABNORMAL HIGH (ref 12.0–15.0)
Hemoglobin: 13.3 g/dL (ref 12.0–15.0)
Hemoglobin: 12.6 g/dL (ref 12.0–15.0)
Hemoglobin: 13.6 g/dL (ref 12.0–15.0)
O2 Saturation: 99 %
O2 Saturation: 98 %
O2 Saturation: 98 %
O2 Saturation: 96 %
Patient temperature: 98.6
Patient temperature: 37.6
Patient temperature: 99
Patient temperature: 37.1
Potassium: 6.4 mmol/L (ref 3.5–5.1)
Potassium: 5.5 mmol/L — ABNORMAL HIGH (ref 3.5–5.1)
Potassium: 5.3 mmol/L — ABNORMAL HIGH (ref 3.5–5.1)
Potassium: 5.5 mmol/L — ABNORMAL HIGH (ref 3.5–5.1)
Sodium: 129 mmol/L — ABNORMAL LOW (ref 135–145)
Sodium: 133 mmol/L — ABNORMAL LOW (ref 135–145)
Sodium: 131 mmol/L — ABNORMAL LOW (ref 135–145)
Sodium: 134 mmol/L — ABNORMAL LOW (ref 135–145)
TCO2: 12 mmol/L — ABNORMAL LOW (ref 22–32)
TCO2: 14 mmol/L — ABNORMAL LOW (ref 22–32)
TCO2: 15 mmol/L — ABNORMAL LOW (ref 22–32)
TCO2: 20 mmol/L — ABNORMAL LOW (ref 22–32)
pCO2 arterial: 38.1 mmHg (ref 32–48)
pCO2 arterial: 39.1 mmHg (ref 32–48)
pCO2 arterial: 35.1 mmHg (ref 32–48)
pCO2 arterial: 46.8 mmHg (ref 32–48)
pH, Arterial: 7.058 — CL (ref 7.35–7.45)
pH, Arterial: 7.112 — CL (ref 7.35–7.45)
pH, Arterial: 7.21 — ABNORMAL LOW (ref 7.35–7.45)
pH, Arterial: 7.208 — ABNORMAL LOW (ref 7.35–7.45)
pO2, Arterial: 215 mmHg — ABNORMAL HIGH (ref 83–108)
pO2, Arterial: 135 mmHg — ABNORMAL HIGH (ref 83–108)
pO2, Arterial: 120 mmHg — ABNORMAL HIGH (ref 83–108)
pO2, Arterial: 102 mmHg (ref 83–108)

## 2024-06-22 LAB — GLUCOSE, CAPILLARY
Glucose-Capillary: 149 mg/dL — ABNORMAL HIGH (ref 70–99)
Glucose-Capillary: 152 mg/dL — ABNORMAL HIGH (ref 70–99)
Glucose-Capillary: 154 mg/dL — ABNORMAL HIGH (ref 70–99)
Glucose-Capillary: 205 mg/dL — ABNORMAL HIGH (ref 70–99)
Glucose-Capillary: 283 mg/dL — ABNORMAL HIGH (ref 70–99)
Glucose-Capillary: 320 mg/dL — ABNORMAL HIGH (ref 70–99)
Glucose-Capillary: 47 mg/dL — ABNORMAL LOW (ref 70–99)
Glucose-Capillary: 49 mg/dL — ABNORMAL LOW (ref 70–99)
Glucose-Capillary: 66 mg/dL — ABNORMAL LOW (ref 70–99)

## 2024-06-22 LAB — CBC
HCT: 51.6 % — ABNORMAL HIGH (ref 36.0–46.0)
HCT: 39.4 % (ref 36.0–46.0)
HCT: 37.5 % (ref 36.0–46.0)
Hemoglobin: 15.6 g/dL — ABNORMAL HIGH (ref 12.0–15.0)
Hemoglobin: 12.2 g/dL (ref 12.0–15.0)
Hemoglobin: 11.8 g/dL — ABNORMAL LOW (ref 12.0–15.0)
MCH: 28.8 pg (ref 26.0–34.0)
MCH: 28.8 pg (ref 26.0–34.0)
MCH: 28.9 pg (ref 26.0–34.0)
MCHC: 30.2 g/dL (ref 30.0–36.0)
MCHC: 31 g/dL (ref 30.0–36.0)
MCHC: 31.5 g/dL (ref 30.0–36.0)
MCV: 95.4 fL (ref 80.0–100.0)
MCV: 92.9 fL (ref 80.0–100.0)
MCV: 91.9 fL (ref 80.0–100.0)
Platelets: 164 K/uL (ref 150–400)
Platelets: 75 K/uL — ABNORMAL LOW (ref 150–400)
Platelets: 59 K/uL — ABNORMAL LOW (ref 150–400)
RBC: 5.41 MIL/uL — ABNORMAL HIGH (ref 3.87–5.11)
RBC: 4.24 MIL/uL (ref 3.87–5.11)
RBC: 4.08 MIL/uL (ref 3.87–5.11)
RDW: 15.7 % — ABNORMAL HIGH (ref 11.5–15.5)
RDW: 15.6 % — ABNORMAL HIGH (ref 11.5–15.5)
RDW: 15.6 % — ABNORMAL HIGH (ref 11.5–15.5)
WBC: 37 K/uL — ABNORMAL HIGH (ref 4.0–10.5)
WBC: 18.5 K/uL — ABNORMAL HIGH (ref 4.0–10.5)
WBC: 15.5 K/uL — ABNORMAL HIGH (ref 4.0–10.5)
nRBC: 0.1 % (ref 0.0–0.2)
nRBC: 0 % (ref 0.0–0.2)
nRBC: 0 % (ref 0.0–0.2)

## 2024-06-22 LAB — RESPIRATORY PANEL BY PCR

## 2024-06-22 LAB — HEPATITIS PANEL, ACUTE
HCV Ab: NONREACTIVE
Hep A IgM: NONREACTIVE
Hep B C IgM: NONREACTIVE
Hepatitis B Surface Ag: NONREACTIVE

## 2024-06-22 LAB — COMPREHENSIVE METABOLIC PANEL WITH GFR
ALT: 193 U/L — ABNORMAL HIGH (ref 0–44)
ALT: 516 U/L — ABNORMAL HIGH (ref 0–44)
ALT: 1959 U/L — ABNORMAL HIGH (ref 0–44)
AST: 271 U/L — ABNORMAL HIGH (ref 15–41)
AST: 512 U/L — ABNORMAL HIGH (ref 15–41)
AST: 2960 U/L — ABNORMAL HIGH (ref 15–41)
Albumin: 1.8 g/dL — ABNORMAL LOW (ref 3.5–5.0)
Albumin: 2.2 g/dL — ABNORMAL LOW (ref 3.5–5.0)
Albumin: 2.5 g/dL — ABNORMAL LOW (ref 3.5–5.0)
Alkaline Phosphatase: 51 U/L (ref 38–126)
Alkaline Phosphatase: 39 U/L (ref 38–126)
Alkaline Phosphatase: 39 U/L (ref 38–126)
Anion gap: 11 (ref 5–15)
Anion gap: 19 — ABNORMAL HIGH (ref 5–15)
Anion gap: 16 — ABNORMAL HIGH (ref 5–15)
BUN: 48 mg/dL — ABNORMAL HIGH (ref 8–23)
BUN: 49 mg/dL — ABNORMAL HIGH (ref 8–23)
BUN: 53 mg/dL — ABNORMAL HIGH (ref 8–23)
CO2: 15 mmol/L — ABNORMAL LOW (ref 22–32)
CO2: 11 mmol/L — ABNORMAL LOW (ref 22–32)
CO2: 16 mmol/L — ABNORMAL LOW (ref 22–32)
Calcium: 6.8 mg/dL — ABNORMAL LOW (ref 8.9–10.3)
Calcium: 6.8 mg/dL — ABNORMAL LOW (ref 8.9–10.3)
Calcium: 6.6 mg/dL — ABNORMAL LOW (ref 8.9–10.3)
Chloride: 105 mmol/L (ref 98–111)
Chloride: 100 mmol/L (ref 98–111)
Chloride: 100 mmol/L (ref 98–111)
Creatinine, Ser: 3.61 mg/dL — ABNORMAL HIGH (ref 0.44–1.00)
Creatinine, Ser: 3.94 mg/dL — ABNORMAL HIGH (ref 0.44–1.00)
Creatinine, Ser: 3.76 mg/dL — ABNORMAL HIGH (ref 0.44–1.00)
GFR, Estimated: 12 mL/min — ABNORMAL LOW (ref >60)
GFR, Estimated: 11 mL/min — ABNORMAL LOW (ref >60)
GFR, Estimated: 12 mL/min — ABNORMAL LOW (ref >60)
Glucose, Bld: 313 mg/dL — ABNORMAL HIGH (ref 70–99)
Glucose, Bld: 299 mg/dL — ABNORMAL HIGH (ref 70–99)
Glucose, Bld: 169 mg/dL — ABNORMAL HIGH (ref 70–99)
Potassium: 5.8 mmol/L — ABNORMAL HIGH (ref 3.5–5.1)
Potassium: 5.5 mmol/L — ABNORMAL HIGH (ref 3.5–5.1)
Potassium: 5.3 mmol/L — ABNORMAL HIGH (ref 3.5–5.1)
Sodium: 131 mmol/L — ABNORMAL LOW (ref 135–145)
Sodium: 130 mmol/L — ABNORMAL LOW (ref 135–145)
Sodium: 132 mmol/L — ABNORMAL LOW (ref 135–145)
Total Bilirubin: 1 mg/dL (ref 0.0–1.2)
Total Bilirubin: 1.4 mg/dL — ABNORMAL HIGH (ref 0.0–1.2)
Total Bilirubin: 1.8 mg/dL — ABNORMAL HIGH (ref 0.0–1.2)
Total Protein: 4.6 g/dL — ABNORMAL LOW (ref 6.5–8.1)
Total Protein: 4.2 g/dL — ABNORMAL LOW (ref 6.5–8.1)
Total Protein: 4.2 g/dL — ABNORMAL LOW (ref 6.5–8.1)

## 2024-06-22 LAB — LACTIC ACID, PLASMA
Lactic Acid, Venous: 3.5 mmol/L (ref 0.5–1.9)
Lactic Acid, Venous: 6.2 mmol/L (ref 0.5–1.9)
Lactic Acid, Venous: 4.5 mmol/L (ref 0.5–1.9)
Lactic Acid, Venous: 3.9 mmol/L (ref 0.5–1.9)

## 2024-06-22 LAB — ECHOCARDIOGRAM COMPLETE
AR max vel: 3.29 cm2
AV Area VTI: 3.41 cm2
AV Area mean vel: 3.19 cm2
AV Mean grad: 2 mmHg
AV Peak grad: 4 mmHg
Ao pk vel: 1 m/s
Area-P 1/2: 3.77 cm2
Calc EF: 42.9 %
S' Lateral: 2.9 cm
Single Plane A2C EF: 46 %
Single Plane A4C EF: 37 %
Weight: 3135.82 [oz_av]

## 2024-06-22 LAB — LIPASE, BLOOD: Lipase: 25 U/L (ref 11–51)

## 2024-06-22 LAB — COOXEMETRY PANEL
Carboxyhemoglobin: 1.7 % — ABNORMAL HIGH (ref 0.5–1.5)
Methemoglobin: <0.7 % (ref 0.0–1.5)
O2 Saturation: 85 %
Total hemoglobin: 12.2 g/dL (ref 12.0–16.0)

## 2024-06-22 LAB — ETHANOL: Alcohol, Ethyl (B): <15 mg/dL (ref <15)

## 2024-06-22 LAB — ACETAMINOPHEN LEVEL: Acetaminophen (Tylenol), Serum: <10 ug/mL — ABNORMAL LOW (ref 10–30)

## 2024-06-22 LAB — FERRITIN: Ferritin: >7500 ng/mL — ABNORMAL HIGH (ref 11–307)

## 2024-06-22 LAB — STREP PNEUMONIAE URINARY ANTIGEN: Strep Pneumo Urinary Antigen: NEGATIVE

## 2024-06-22 LAB — DIC (DISSEMINATED INTRAVASCULAR COAGULATION)PANEL
D-Dimer, Quant: >20 ug{FEU}/mL — ABNORMAL HIGH (ref 0.00–0.50)
Fibrinogen: 364 mg/dL (ref 210–475)
INR: 2.8 — ABNORMAL HIGH (ref 0.8–1.2)
Platelets: 72 K/uL — ABNORMAL LOW (ref 150–400)
Prothrombin Time: 31.2 s — ABNORMAL HIGH (ref 11.4–15.2)
Smear Review: NONE SEEN
aPTT: 73 s — ABNORMAL HIGH (ref 24–36)

## 2024-06-22 LAB — MAGNESIUM
Magnesium: 1.7 mg/dL (ref 1.7–2.4)
Magnesium: 2.1 mg/dL (ref 1.7–2.4)

## 2024-06-22 LAB — HEMOGLOBIN A1C
Hgb A1c MFr Bld: 6.3 % — ABNORMAL HIGH (ref 4.8–5.6)
Mean Plasma Glucose: 134.11 mg/dL

## 2024-06-22 LAB — CORTISOL: Cortisol, Plasma: >100 ug/dL

## 2024-06-22 LAB — VITAMIN B12: Vitamin B-12: 1301 pg/mL — ABNORMAL HIGH (ref 180–914)

## 2024-06-22 LAB — BRAIN NATRIURETIC PEPTIDE: B Natriuretic Peptide: 3589.6 pg/mL — ABNORMAL HIGH (ref 0.0–100.0)

## 2024-06-22 LAB — SEDIMENTATION RATE: Sed Rate: 9 mm/h (ref 0–22)

## 2024-06-22 LAB — CK: Total CK: 5641 U/L — ABNORMAL HIGH (ref 38–234)

## 2024-06-22 LAB — TSH: TSH: 4.351 u[IU]/mL (ref 0.350–4.500)

## 2024-06-22 LAB — TYPE AND SCREEN
ABO/RH(D): O POS
Antibody Screen: NEGATIVE

## 2024-06-22 LAB — AMMONIA: Ammonia: 80 umol/L — ABNORMAL HIGH (ref 9–35)

## 2024-06-22 LAB — T4, FREE: Free T4: 0.85 ng/dL (ref 0.61–1.12)

## 2024-06-22 LAB — MRSA NEXT GEN BY PCR, NASAL: MRSA by PCR Next Gen: NOT DETECTED

## 2024-06-22 LAB — PHOSPHORUS
Phosphorus: 6.8 mg/dL — ABNORMAL HIGH (ref 2.5–4.6)
Phosphorus: 6 mg/dL — ABNORMAL HIGH (ref 2.5–4.6)

## 2024-06-22 MED ORDER — DEXTROSE 5 % IV SOLN
15.0000 mg/kg/h | INTRAVENOUS | Status: DC
  Administered 2024-06-22 – 2024-06-23 (×3): 15 mg/kg/h via INTRAVENOUS
  Filled 2024-06-22 (×3): qty 90

## 2024-06-22 MED ORDER — DEXTROSE 10 % IV SOLN: INTRAVENOUS | Status: DC

## 2024-06-22 MED ORDER — MAGNESIUM SULFATE 2 GM/50ML IV SOLN
2.0000 g | Freq: Once | INTRAVENOUS | Status: AC
  Administered 2024-06-22: 2 g via INTRAVENOUS
  Filled 2024-06-22: qty 50

## 2024-06-22 MED ORDER — PIPERACILLIN-TAZOBACTAM 3.375 G IVPB 30 MIN
3.3750 g | Freq: Once | INTRAVENOUS | Status: AC
  Administered 2024-06-22: 3.375 g via INTRAVENOUS
  Filled 2024-06-22: qty 50

## 2024-06-22 MED ORDER — SODIUM CHLORIDE 0.9% FLUSH: 10.0000 mL | INTRAVENOUS | Status: DC | PRN

## 2024-06-22 MED ORDER — DEXTROSE 50 % IV SOLN
INTRAVENOUS | Status: AC
  Filled 2024-06-22: qty 50

## 2024-06-22 MED ORDER — VITAMIN K1 10 MG/ML IJ SOLN
10.0000 mg | Freq: Once | INTRAVENOUS | Status: AC
  Administered 2024-06-22: 10 mg via INTRAVENOUS
  Filled 2024-06-22: qty 1

## 2024-06-22 MED ORDER — CALCIUM GLUCONATE-NACL 2-0.675 GM/100ML-% IV SOLN
2.0000 g | Freq: Once | INTRAVENOUS | Status: AC
  Administered 2024-06-22: 2000 mg via INTRAVENOUS
  Filled 2024-06-22: qty 100

## 2024-06-22 MED ORDER — SODIUM BICARBONATE 8.4 % IV SOLN
50.0000 meq | Freq: Once | INTRAVENOUS | Status: AC
  Administered 2024-06-22: 50 meq via INTRAVENOUS
  Filled 2024-06-22: qty 50

## 2024-06-22 MED ORDER — ARFORMOTEROL TARTRATE 15 MCG/2ML IN NEBU
15.0000 ug | INHALATION_SOLUTION | Freq: Two times a day (BID) | RESPIRATORY_TRACT | Status: DC
  Administered 2024-06-22 – 2024-06-24 (×4): 15 ug via RESPIRATORY_TRACT
  Filled 2024-06-22 (×4): qty 2

## 2024-06-22 MED ORDER — AMIODARONE HCL IN DEXTROSE 360-4.14 MG/200ML-% IV SOLN
60.0000 mg/h | INTRAVENOUS | Status: AC
  Administered 2024-06-22 (×2): 60 mg/h via INTRAVENOUS
  Filled 2024-06-22 (×2): qty 200

## 2024-06-22 MED ORDER — SODIUM BICARBONATE 8.4 % IV SOLN
INTRAVENOUS | Status: DC
  Filled 2024-06-22: qty 1000

## 2024-06-22 MED ORDER — SODIUM CHLORIDE 3 % IN NEBU
4.0000 mL | INHALATION_SOLUTION | Freq: Four times a day (QID) | RESPIRATORY_TRACT | Status: DC
  Administered 2024-06-22 – 2024-06-24 (×10): 4 mL via RESPIRATORY_TRACT
  Filled 2024-06-22 (×9): qty 15
  Filled 2024-06-22: qty 4
  Filled 2024-06-22: qty 15

## 2024-06-22 MED ORDER — PANTOPRAZOLE SODIUM 40 MG IV SOLR
40.0000 mg | Freq: Two times a day (BID) | INTRAVENOUS | Status: DC
  Administered 2024-06-22 – 2024-06-24 (×5): 40 mg via INTRAVENOUS
  Filled 2024-06-22 (×5): qty 10

## 2024-06-22 MED ORDER — PIPERACILLIN-TAZOBACTAM 3.375 G IVPB
3.3750 g | Freq: Two times a day (BID) | INTRAVENOUS | Status: DC
  Administered 2024-06-22 – 2024-06-23 (×3): 3.375 g via INTRAVENOUS
  Filled 2024-06-22 (×3): qty 50

## 2024-06-22 MED ORDER — ACETYLCYSTEINE LOAD VIA INFUSION
150.0000 mg/kg | Freq: Once | INTRAVENOUS | Status: AC
  Administered 2024-06-22: 13335 mg via INTRAVENOUS
  Filled 2024-06-22: qty 438

## 2024-06-22 MED ORDER — AMIODARONE HCL IN DEXTROSE 360-4.14 MG/200ML-% IV SOLN
INTRAVENOUS | Status: AC
  Administered 2024-06-22: 150 mg via INTRAVENOUS
  Filled 2024-06-22: qty 200

## 2024-06-22 MED ORDER — CALCIUM GLUCONATE-NACL 1-0.675 GM/50ML-% IV SOLN
1.0000 g | Freq: Once | INTRAVENOUS | Status: AC
  Administered 2024-06-22: 1000 mg via INTRAVENOUS
  Filled 2024-06-22: qty 50

## 2024-06-22 MED ORDER — VANCOMYCIN HCL 1750 MG/350ML IV SOLN
1750.0000 mg | Freq: Once | INTRAVENOUS | Status: AC
  Administered 2024-06-22: 1750 mg via INTRAVENOUS
  Filled 2024-06-22: qty 350

## 2024-06-22 MED ORDER — NOREPINEPHRINE 16 MG/250ML-% IV SOLN
0.0000 ug/min | INTRAVENOUS | Status: DC
  Administered 2024-06-22: 10 ug/min via INTRAVENOUS
  Administered 2024-06-22: 13 ug/min via INTRAVENOUS
  Administered 2024-06-23: 24 ug/min via INTRAVENOUS
  Administered 2024-06-24: 28 ug/min via INTRAVENOUS
  Administered 2024-06-24: 36 ug/min via INTRAVENOUS
  Filled 2024-06-22 (×5): qty 250

## 2024-06-22 MED ORDER — AMIODARONE HCL IN DEXTROSE 360-4.14 MG/200ML-% IV SOLN
30.0000 mg/h | INTRAVENOUS | Status: DC
  Administered 2024-06-23 – 2024-06-24 (×4): 30 mg/h via INTRAVENOUS
  Filled 2024-06-22 (×3): qty 200

## 2024-06-22 MED ORDER — ALBUMIN HUMAN 25 % IV SOLN
50.0000 g | Freq: Once | INTRAVENOUS | Status: AC
  Administered 2024-06-22: 12.5 g via INTRAVENOUS
  Filled 2024-06-22: qty 200

## 2024-06-22 MED ORDER — SODIUM BICARBONATE 8.4 % IV SOLN
INTRAVENOUS | Status: DC
  Filled 2024-06-22 (×2): qty 1000

## 2024-06-22 MED ORDER — STERILE WATER FOR INJECTION IV SOLN
INTRAVENOUS | Status: DC
  Filled 2024-06-22 (×5): qty 1000
  Filled 2024-06-22: qty 150
  Filled 2024-06-22 (×2): qty 1000

## 2024-06-22 MED ORDER — VANCOMYCIN VARIABLE DOSE PER UNSTABLE RENAL FUNCTION (PHARMACIST DOSING): Status: DC

## 2024-06-22 MED ORDER — VASOPRESSIN 20 UNITS/100 ML INFUSION FOR SHOCK
0.0000 [IU]/min | INTRAVENOUS | Status: DC
  Administered 2024-06-22 (×3): 0.03 [IU]/min via INTRAVENOUS
  Administered 2024-06-23 – 2024-06-24 (×4): 0.04 [IU]/min via INTRAVENOUS
  Filled 2024-06-22 (×7): qty 100

## 2024-06-22 MED ORDER — AMIODARONE LOAD VIA INFUSION
150.0000 mg | Freq: Once | INTRAVENOUS | Status: AC
  Filled 2024-06-22: qty 83.34

## 2024-06-22 MED ORDER — CHLORHEXIDINE GLUCONATE CLOTH 2 % EX PADS
6.0000 | MEDICATED_PAD | Freq: Every day | CUTANEOUS | Status: DC
  Administered 2024-06-22 – 2024-06-24 (×3): 6 via TOPICAL

## 2024-06-22 MED ORDER — SODIUM ZIRCONIUM CYCLOSILICATE 10 G PO PACK
10.0000 g | PACK | Freq: Once | ORAL | Status: AC
  Administered 2024-06-22: 10 g
  Filled 2024-06-22: qty 1

## 2024-06-22 MED ORDER — SODIUM CHLORIDE 0.9% FLUSH
10.0000 mL | Freq: Two times a day (BID) | INTRAVENOUS | Status: DC
  Administered 2024-06-22 (×2): 10 mL
  Administered 2024-06-22: 20 mL
  Administered 2024-06-23 – 2024-06-24 (×3): 10 mL

## 2024-06-22 MED FILL — Fentanyl Citrate-NaCl 0.9% IV Soln 2.5 MG/250ML: INTRAVENOUS | Qty: 250 | Status: AC

## 2024-06-22 NOTE — Progress Notes (Signed)
 CXR showing left side whited out. ETT in good position. Bedside US  w/ no significant effusion on left. Will position left side up, cpt, and hypertonic saline. CXR in am. May need to consider Bronch.  JD Emilio RIGGERS [facility] 06/22/2024, 12:12 AM  Please see [email or web address] for pager details.  From 7A-7P if no response, please call [phone]. After hours, please call ELink [phone].

## 2024-06-22 NOTE — Plan of Care (Addendum)
 Met with pt's daughter, [name], at bedside.  Daughter states that pt lives with her two sons but pt manages her own medications. Reports that pt recently took her 10 day steroid taper in 5 days, and when she went to pt's house she found that her pills were dumped into a pile. These pills included the prednisone , gabapentin, synthroid , atorvastatin. Pt was recently prescribed hydrocodone for ankle pain but reportedly has been taking tylenol  only for pain.  Pt became confused, delirious, complaining of severe abdominal pain, vomiting, and having multiple episodes of diarrhea morning of 7/21. Daughter brought pt to the ED. Daughter reports that pt began having BRB in her stool while in the ED. She also feels like pt's abdomen has been more distended than usual recently. Denies alcohol  use.  Daughter reports that pt has a history of CKD3 and has refused dialysis in the past. States that since last month pt has been having issues urinating.   2:49PM: Daughter clarifies that pt is FULL CODE. Spoke with lab and we are unable to add an acetaminophen  level onto pt's initial labwork from the ED.

## 2024-06-22 NOTE — Progress Notes (Signed)
 Pharmacy Antibiotic Note  [name] is a 78 y.o. female admitted on 06/21/2024 with pneumonia and sepsis.  Pharmacy has been consulted for vancomycin  and Zosyn  dosing.  Pt w/ AKI on CKD.  Plan: Vancomycin  1750mg  IV x1; monitor SCr +/- vanc levels prior to redosing. Zosyn  3.375g IV Q12H (4-hour infusion).  Weight: 88.9 kg (195 lb 15.8 oz)  Temp (24hrs), Avg:101.3 F (38.5 C), Min:99 F (37.2 C), Max:102.4 F (39.1 C)  Recent Labs  Lab 06/21/24 1525 06/21/24 1535 06/21/24 1622 06/21/24 1750 06/22/24 0044 06/22/24 0330  WBC  --   --  21.0*  --   --  37.0*  CREATININE 3.05*  --   --   --   --  3.61*  LATICACIDVEN  --  6.0*  --  7.1* 3.5*  --     Estimated Creatinine Clearance: 13.9 mL/min (A) (by C-G formula based on SCr of 3.61 mg/dL (H)).    No Known Allergies   Thank you for allowing pharmacy to be a part of this patient's care.  Marvetta Dauphin, PharmD, BCPS  06/22/2024 6:04 AM

## 2024-06-22 NOTE — Progress Notes (Signed)
 NAME:  RAHMAH MCCAMY, MRN:  [id number], DOB:  November 23, 1946, LOS: 1 ADMISSION DATE:  06/21/2024  History of Present Illness:  78 yo F w/ pertinent PMH tobacco abuse, emphysema, previous PE 2021 on warfarin for 1 year, hypothyroidism, PAH, PAD presents to [facility] on 7/21 w/ AMS.   Patient recently seen by ED on 7/11 for right ankle pain/swelling. Dvt us  negative. Xray showing small bone fragment possible avulsion fracture. Sent home w/ pain meds and ankle wrapped. F/u w/ ortho on 7/14 who rx prednisone  taper for 10 days and hydrocodone.   On 7/21 patient having acute N/V/Diarrhea and abd pain in am. Per patient's daughter patient took her steroid taper much quicker than was prescribed. EMS called and cbg 30 given dextrose . Brought to [facility] ED. Placed on 4L [place] sats 90%. Patient tachypneic 20-30s w/ congested cough. Patient bp initially stable but became hypotensive. Temp 85F and wbc 21. Cultures obtained and given iv fluids. CXR w/ bibasilar disease and left pleural effusion. Started on rocephin /azithro. Covid/flu negative. LA 6 then 7.1. UA w/ rare bacteria. Despite iv fluids patient remained hypotensive requiring levo. Patient with increasing sob and having becoming more altered/confused. Placed on bipap but unable to tolerate. Patient intubated. PCCM consulted for icu admission.  Of note, pt's RF lab test was high on 06/15/24, ANA negative, elevated CRP and sed rate.   Pertinent  Medical History  Emphysema on 4L [place] at baseline Chronic lymphocytic thyroiditis 2021 Hx PE 2021 Hypothyroidism, myxedema coma PAD Pulmonary HTN  Significant Hospital Events: Including procedures, antibiotic start and stop dates in addition to other pertinent events   7/21 admitted for ams and shock; worsening resp failure now intubated   Interim History / Subjective:  Arterial line placed overnight. Vancomycin  and Zosyn  added. Electrolytes managed.   Objective    Blood pressure (!) 94/52, pulse 88, temperature (!)  100.9 F (38.3 C), resp. rate (!) 26, weight 88.9 kg, SpO2 (!) 85%.    Vent Mode: PRVC FiO2 (%):  [40 %-100 %] 70 % Set Rate:  [15 bmp-26 bmp] 26 bmp Vt Set:  [460 mL] 460 mL PEEP:  [5 cmH20-10 cmH20] 8 cmH20 Plateau Pressure:  [17 cmH20-22 cmH20] 22 cmH20   Intake/Output Summary (Last 24 hours) at 06/22/2024 0906 Last data filed at 06/22/2024 0601 Gross per 24 hour  Intake 5436.47 ml  Output --  Net 5436.47 ml   Filed Weights   06/21/24 2350 06/22/24 0432  Weight: 88.9 kg 88.9 kg   Examination: General: ill appearing Lungs: intubated, symmetric chest rise/fall, slightly diminished lung sounds on left Cardiovascular: RRR, no murmur Abdomen: hypoactive bowel sounds, soft, non-distended Extremities: Unable to palpate radial, DP, PT pulses. Hands and feet are cold to touch.  Neuro: Sedated, does not follow commands, eyes open and blinking spontaneously  Skin: mottling noted to abdomen, BUE, and BLE   Resolved problem list  Thrombocytopenia Assessment and Plan   Shock 2/2 sepsis, suspected pulmonary source Suspected adrenal insufficiency in setting of recent steroid misuse  - on levophed , vasopressin , and epinephrine  gtt - wean for map goal >65 - stress dose steroids 100mg  q8h - check cortisol - Zosyn , Azithromycin  (7/21-) for pneumonia coverage - D/c vancomycin  (mrsa swab negative) - Sodium bicarb at 131ml/h - D10 at 50ml/h, consider discontinuing if remains hyperglycemic - f/u blood cultures - autoimmune workup given recent high RF   Acute hypoxic hypercarbic respiratory failure, suspected CAP Hx emphysema PAH  - antibiotics as above - continue vent support,  wean as able - f/u tracheal aspirate - monitor ABG - brovana , pulmicort  nebs, prn duoneb - hypertonic NaCl neb q6h - f/u RPP - pulmonary hygiene  Acute encephalopathy 2/2 above - limit sedating meds - check tsh  Hypoglycemia No hx DM, not on any DM medications at home - D10 at 25ml/h - CBG  q4h  Abd pain N/V/D CT A/P no acute findings - supportive care, continue to monitor  AKI on CKD3a - IVF - trend BMP, UOP - avoid nephrotoxic agents  Elevated LFTs - CTM  Hypothyroidism - continue Home synthroid  112mcg daily - Check t4, tsh  PAD - Holding home statin due to LFTs  Best Practice (right click and Reselect all SmartList Selections daily)   Diet/type: NPO DVT prophylaxis prophylactic heparin   Pressure ulcer(s): N/A GI prophylaxis: H2B Lines: Central line and Arterial Line Foley:  Yes, and it is still needed Code Status:  full code Last date of multidisciplinary goals of care discussion []   Labs   CBC: Recent Labs  Lab 06/21/24 1622 06/21/24 2211 06/22/24 0330 06/22/24 0356 06/22/24 0556  WBC 21.0*  --  37.0*  --   --   HGB 12.4 17.3* 15.6* 17.0* 17.0*  HCT 42.2 51.0* 51.6* 50.0* 50.0*  MCV 96.3  --  95.4  --   --   PLT 126*  --  164  --   --     Basic Metabolic Panel: Recent Labs  Lab 06/21/24 1525 06/21/24 2211 06/22/24 0330 06/22/24 0356 06/22/24 0556  NA 137 135 131* 133* 129*  K 4.2 5.2* 5.8* 5.3* 6.4*  CL 107  --  105  --   --   CO2 18*  --  15*  --   --   GLUCOSE 69*  --  313*  --   --   BUN 42*  --  48*  --   --   CREATININE 3.05*  --  3.61*  --   --   CALCIUM  7.5*  --  6.8*  --   --    GFR: Estimated Creatinine Clearance: 13.9 mL/min (A) (by C-G formula based on SCr of 3.61 mg/dL (H)). Recent Labs  Lab 06/21/24 1535 06/21/24 1622 06/21/24 1750 06/22/24 0044 06/22/24 0330  WBC  --  21.0*  --   --  37.0*  LATICACIDVEN 6.0*  --  7.1* 3.5*  --     Liver Function Tests: Recent Labs  Lab 06/21/24 1525 06/22/24 0330  AST 170* 271*  ALT 109* 193*  ALKPHOS 47 51  BILITOT 0.9 1.0  PROT 4.3* 4.6*  ALBUMIN  1.9* 1.8*   No results for input(s): LIPASE, AMYLASE in the last 168 hours. No results for input(s): AMMONIA in the last 168 hours.  ABG    Component Value Date/Time   PHART 7.058 (LL) 06/22/2024 0556    PCO2ART 38.1 06/22/2024 0556   PO2ART 215 (H) 06/22/2024 0556   HCO3 10.7 (L) 06/22/2024 0556   TCO2 12 (L) 06/22/2024 0556   ACIDBASEDEF 19.0 (H) 06/22/2024 0556   O2SAT 99 06/22/2024 0556     Coagulation Profile: Recent Labs  Lab 06/21/24 1525  INR 1.4*    Cardiac Enzymes: No results for input(s): CKTOTAL, CKMB, CKMBINDEX, TROPONINI in the last 168 hours.  HbA1C: Hgb A1c MFr Bld  Date/Time Value Ref Range Status  08/06/2020 07:00 AM 6.0 (H) 4.8 - 5.6 % Final    Comment:    (NOTE) Pre diabetes:  5.7%-6.4%  Diabetes:              >6.4%  Glycemic control for   <7.0% adults with diabetes     CBG: Recent Labs  Lab 06/21/24 2304 06/21/24 2328 06/22/24 0338 06/22/24 0342 06/22/24 0811  GLUCAP 28* 89 47* 320* 283*    Review of Systems:   Sedated, intubated  Past Medical History:  She,  has a past medical history of Acute metabolic encephalopathy, Aortic atherosclerosis (HCC) (08/18/2020), Aortic atherosclerosis (HCC), Arthritis, Chronic lymphocytic thyroiditis (08/18/2020), Coronary artery calcification, Coronary atherosclerosis, Dyspnea, Emphysema lung (HCC) (08/18/2020), History of pulmonary embolism, Hypothyroidism, Myxedema coma (HCC), PAD (peripheral artery disease) (HCC), Pericardial effusion, Pulmonary arterial hypertension (HCC), Pulmonary embolism (HCC) (08/2020), Sepsis (HCC), and Wears dentures.   Surgical History:   Past Surgical History:  Procedure Laterality Date   CATARACT EXTRACTION W/PHACO Right 08/19/2022   Procedure: CATARACT EXTRACTION PHACO AND INTRAOCULAR LENS PLACEMENT (IOC) RIGHT;  Surgeon: Myrna Adine Anes, MD;  Location: [facility];  Service: Ophthalmology;  Laterality: Right;  10.29 1:00.3   CATARACT EXTRACTION W/PHACO Left 10/20/2023   Procedure: CATARACT EXTRACTION PHACO AND INTRAOCULAR LENS PLACEMENT (IOC) LEFT 10.80 01:06.3;  Surgeon: Myrna Adine Anes, MD;  Location: [facility];  Service:  Ophthalmology;  Laterality: Left;   CYST EXCISION Left 01/28/2024   Procedure: CYST REMOVAL;  Surgeon: Tye Millet, DO;  Location: [facility];  Service: General;  Laterality: Left;   TUBAL LIGATION       Social History:   reports that she has been smoking cigarettes. She started smoking about 62 years ago. She has a 61.6 pack-year smoking history. She has never used smokeless tobacco. She reports that she does not drink alcohol  and does not use drugs.   Family History:  Her family history is not on file.   Allergies No Known Allergies   Home Medications  Prior to Admission medications   Medication Sig Start Date End Date Taking? Authorizing Provider  atorvastatin (LIPITOR) 20 MG tablet Take 20 mg by mouth daily. 12/16/23 12/15/24 Yes [provider]  diclofenac Sodium (VOLTAREN) 1 % GEL Apply 2 g topically daily as needed (Pain). 12/16/23  Yes [provider]  furosemide  (LASIX ) 20 MG tablet Take 20 mg by mouth daily as needed for edema.   Yes [provider]  HYDROcodone-acetaminophen  (NORCO/VICODIN) 5-325 MG tablet Take 1 tablet by mouth 4 (four) times daily as needed (for pain).   Yes [provider]  levothyroxine  (SYNTHROID ) 112 MCG tablet Take 112 mcg by mouth daily before breakfast. 02/12/22  Yes [provider]  mometasone -formoterol  (DULERA ) 200-5 MCG/ACT AERO Inhale 2 puffs into the lungs 2 (two) times daily.   Yes [provider]  OXYGEN Inhale 4 L/min into the lungs continuous.   Yes [provider]  Cholecalciferol (VITAMIN D3) 75 MCG (3000 UT) TABS Take 3,000 mcg by mouth in the morning. Patient not taking: Reported on 06/21/2024 01/28/24   Sakai, Isami, DO  cyanocobalamin (VITAMIN B12) 1000 MCG tablet Take 3 tablets (3,000 mcg total) by mouth in the morning. Patient not taking: Reported on 06/21/2024 01/28/24   Sakai, Isami, DO  traMADol  (ULTRAM ) 50 MG tablet Take 1 tablet (50 mg total) by mouth every 6 (six) hours as  needed for up to 8 doses. Patient not taking: Reported on 06/21/2024 01/28/24   Sakai, Isami, DO     Critical care time: 30 minutes

## 2024-06-22 NOTE — Procedures (Signed)
 Arterial Catheter Insertion Procedure Note  Miranda Cohen  [id number]  [birth year]  Date:06/22/24  Time:6:15 AM    Provider Performing: Norleen JONETTA Cedar    Procedure: Insertion of Arterial Line (63379) with US  guidance (23062)   Indication(s) Blood pressure monitoring and/or need for frequent ABGs  Consent Risks of the procedure as well as the alternatives and risks of each were explained to the patient and/or caregiver.  Consent for the procedure was obtained and is signed in the bedside chart  Anesthesia None   Time Out Verified patient identification, verified procedure, site/side was marked, verified correct patient position, special equipment/implants available, medications/allergies/relevant history reviewed, required imaging and test results available.   Sterile Technique Maximal sterile technique including full sterile barrier drape, hand hygiene, sterile gown, sterile gloves, mask, hair covering, sterile ultrasound probe cover (if used).   Procedure Description Area of catheter insertion was cleaned with chlorhexidine  and draped in sterile fashion. With real-time ultrasound guidance an arterial catheter was placed into the left femoral artery.  Appropriate arterial tracings confirmed on monitor.     Complications/Tolerance None; patient tolerated the procedure well.   EBL Minimal   Specimen(s) None  JD Cedar RIGGERS [facility] 06/22/2024, 6:15 AM  Please see [email or web address] for pager details.  From 7A-7P if no response, please call [phone]. After hours, please call ELink [phone].

## 2024-06-22 NOTE — Progress Notes (Addendum)
 eLink Physician-Brief Progress Note Patient Name: Miranda Cohen DOB: 1946/10/29 MRN: [id number]   Date of Service  06/22/2024  HPI/Events of Note  Notified of ABG and LA, and BMP. Chart reviewed. Patient on 100%, peep 5, RR 20, VT 460 (8 cc/kg IBW) already. Recent CXR with better aeration of left lung. Left internal jugular CVC noted. Severe shock on 3 pressors. No waveform for o2 sat on camera. The blood gas was a VBG since RT had issues getting ABG. DW RT and will increase RR to 26 for now and repeat ABG in an hour of making those changes. Will also need an art line since waveform and Bps do not seem accurate. Case DW CCM APP Norleen Cedar as well.   eICU Interventions  Stop ceftriaxone  Add vanc and zosyn  given severity of illness and follow MRSA PCR Repeat IV bicarb x 1 Will also give lokelma  x 1 and calcium  gluconate Noted imaging is pending but has been too unstable to go down for CT LA better on most recent check and dw CCM team will give lokelma  for hyperkalemia. Has been hypoglycemic needing D10 DW RT and RN as well       Intervention Category Major Interventions: Shock - evaluation and management;Respiratory failure - evaluation and management  Anaston Koehn G Suheyb Raucci 06/22/2024, 5:18 AM  620 am - Art line is in and ABG reported. Noted severe ongoing metabolic acidosis. Is also on d10 at 100 cc/hr. I spoke with pharmacist and given the ongoing shock with high dose pressor, will add bicarb to the D10 itself. Pharmacist is placing those orders, appreciate their assistance. Will repeat ABG in 4 hours or so.

## 2024-06-22 NOTE — Progress Notes (Signed)
 Pharmacy note: Acetadote   61 you female here with AMS and possible acetaminophen  overdose. Acetadote  has been ordered. She is noted with VDRF and septic shock on pressors  -AST/ALT= 2960/1959 (trend up) -INR= 2.8 (trend up) -Acetaminophen  level < 10   [facility] recommendations -Continue Acetadote  -Monitor LFTs and INR (already ordered) -Consider vitamin K  10mg  IV -They will follow with patient progress on 7/23  Prentice Poisson, PharmD Clinical Pharmacist **Pharmacist phone directory can now be found on [email or web address] ([email or web address]).  Listed under [facility].

## 2024-06-22 NOTE — Progress Notes (Signed)
*  PRELIMINARY RESULTS* Echocardiogram 2D Echocardiogram has been performed.  Miranda Cohen 06/22/2024, 10:52 AM

## 2024-06-23 ENCOUNTER — Inpatient Hospital Stay (HOSPITAL_COMMUNITY)

## 2024-06-23 DIAGNOSIS — R6521 Severe sepsis with septic shock: Secondary | ICD-10-CM | POA: Diagnosis not present

## 2024-06-23 DIAGNOSIS — J9602 Acute respiratory failure with hypercapnia: Secondary | ICD-10-CM | POA: Diagnosis not present

## 2024-06-23 DIAGNOSIS — R609 Edema, unspecified: Secondary | ICD-10-CM | POA: Diagnosis not present

## 2024-06-23 DIAGNOSIS — A419 Sepsis, unspecified organism: Secondary | ICD-10-CM | POA: Diagnosis not present

## 2024-06-23 DIAGNOSIS — J9601 Acute respiratory failure with hypoxia: Secondary | ICD-10-CM | POA: Diagnosis not present

## 2024-06-23 LAB — ANCA PROFILE
Anti-MPO Antibodies: <0.2 U (ref 0.0–0.9)
Anti-PR3 Antibodies: <0.2 U (ref 0.0–0.9)
Atypical P-ANCA titer: <1:20 {titer}
C-ANCA: <1:20 {titer}
P-ANCA: <1:20 {titer}

## 2024-06-23 LAB — RENAL FUNCTION PANEL
Albumin: 1.7 g/dL — ABNORMAL LOW (ref 3.5–5.0)
Anion gap: 27 — ABNORMAL HIGH (ref 5–15)
BUN: 62 mg/dL — ABNORMAL HIGH (ref 8–23)
CO2: 13 mmol/L — ABNORMAL LOW (ref 22–32)
Calcium: 6.4 mg/dL — CL (ref 8.9–10.3)
Chloride: 91 mmol/L — ABNORMAL LOW (ref 98–111)
Creatinine, Ser: 4.3 mg/dL — ABNORMAL HIGH (ref 0.44–1.00)
GFR, Estimated: 10 mL/min — ABNORMAL LOW (ref >60)
Glucose, Bld: 152 mg/dL — ABNORMAL HIGH (ref 70–99)
Phosphorus: 7.4 mg/dL — ABNORMAL HIGH (ref 2.5–4.6)
Potassium: 5.6 mmol/L — ABNORMAL HIGH (ref 3.5–5.1)
Sodium: 131 mmol/L — ABNORMAL LOW (ref 135–145)

## 2024-06-23 LAB — PROTIME-INR
INR: 2.2 — ABNORMAL HIGH (ref 0.8–1.2)
INR: 1.9 — ABNORMAL HIGH (ref 0.8–1.2)
Prothrombin Time: 25.2 s — ABNORMAL HIGH (ref 11.4–15.2)
Prothrombin Time: 22.3 s — ABNORMAL HIGH (ref 11.4–15.2)

## 2024-06-23 LAB — GLUCOSE, CAPILLARY
Glucose-Capillary: 115 mg/dL — ABNORMAL HIGH (ref 70–99)
Glucose-Capillary: 142 mg/dL — ABNORMAL HIGH (ref 70–99)
Glucose-Capillary: 169 mg/dL — ABNORMAL HIGH (ref 70–99)
Glucose-Capillary: 34 mg/dL — CL (ref 70–99)
Glucose-Capillary: 76 mg/dL (ref 70–99)

## 2024-06-23 LAB — CBC WITH DIFFERENTIAL/PLATELET
Abs Immature Granulocytes: 0.15 K/uL — ABNORMAL HIGH (ref 0.00–0.07)
Basophils Absolute: 0.1 K/uL (ref 0.0–0.1)
Basophils Relative: 1 %
Eosinophils Absolute: 0 K/uL (ref 0.0–0.5)
Eosinophils Relative: 0 %
HCT: 39.2 % (ref 36.0–46.0)
Hemoglobin: 12.4 g/dL (ref 12.0–15.0)
Immature Granulocytes: 1 %
Lymphocytes Relative: 2 %
Lymphs Abs: 0.4 K/uL — ABNORMAL LOW (ref 0.7–4.0)
MCH: 28.9 pg (ref 26.0–34.0)
MCHC: 31.6 g/dL (ref 30.0–36.0)
MCV: 91.4 fL (ref 80.0–100.0)
Monocytes Absolute: 0.3 K/uL (ref 0.1–1.0)
Monocytes Relative: 1 %
Neutro Abs: 18.4 K/uL — ABNORMAL HIGH (ref 1.7–7.7)
Neutrophils Relative %: 95 %
Platelets: 47 K/uL — ABNORMAL LOW (ref 150–400)
RBC: 4.29 MIL/uL (ref 3.87–5.11)
RDW: 15.7 % — ABNORMAL HIGH (ref 11.5–15.5)
Smear Review: DECREASED
WBC Morphology: INCREASED
WBC: 19.2 K/uL — ABNORMAL HIGH (ref 4.0–10.5)
nRBC: 0.1 % (ref 0.0–0.2)

## 2024-06-23 LAB — BASIC METABOLIC PANEL WITH GFR
Anion gap: 22 — ABNORMAL HIGH (ref 5–15)
BUN: 57 mg/dL — ABNORMAL HIGH (ref 8–23)
CO2: 15 mmol/L — ABNORMAL LOW (ref 22–32)
Calcium: 6.5 mg/dL — ABNORMAL LOW (ref 8.9–10.3)
Chloride: 96 mmol/L — ABNORMAL LOW (ref 98–111)
Creatinine, Ser: 4.01 mg/dL — ABNORMAL HIGH (ref 0.44–1.00)
GFR, Estimated: 11 mL/min — ABNORMAL LOW (ref >60)
Glucose, Bld: 148 mg/dL — ABNORMAL HIGH (ref 70–99)
Potassium: 5.6 mmol/L — ABNORMAL HIGH (ref 3.5–5.1)
Sodium: 133 mmol/L — ABNORMAL LOW (ref 135–145)

## 2024-06-23 LAB — LEGIONELLA PNEUMOPHILA SEROGP 1 UR AG: L. pneumophila Serogp 1 Ur Ag: NEGATIVE

## 2024-06-23 LAB — URINE CULTURE: Culture: NO GROWTH

## 2024-06-23 LAB — POCT I-STAT 7, (LYTES, BLD GAS, ICA,H+H)
Acid-base deficit: 10 mmol/L — ABNORMAL HIGH (ref 0.0–2.0)
Bicarbonate: 17.4 mmol/L — ABNORMAL LOW (ref 20.0–28.0)
Calcium, Ion: 0.91 mmol/L — ABNORMAL LOW (ref 1.15–1.40)
HCT: 35 % — ABNORMAL LOW (ref 36.0–46.0)
Hemoglobin: 11.9 g/dL — ABNORMAL LOW (ref 12.0–15.0)
O2 Saturation: 97 %
Patient temperature: 37.5
Potassium: 5.3 mmol/L — ABNORMAL HIGH (ref 3.5–5.1)
Sodium: 127 mmol/L — ABNORMAL LOW (ref 135–145)
TCO2: 19 mmol/L — ABNORMAL LOW (ref 22–32)
pCO2 arterial: 45.4 mmHg (ref 32–48)
pH, Arterial: 7.195 — CL (ref 7.35–7.45)
pO2, Arterial: 109 mmHg — ABNORMAL HIGH (ref 83–108)

## 2024-06-23 LAB — CBC
HCT: 39.9 % (ref 36.0–46.0)
Hemoglobin: 12.5 g/dL (ref 12.0–15.0)
MCH: 28.7 pg (ref 26.0–34.0)
MCHC: 31.3 g/dL (ref 30.0–36.0)
MCV: 91.7 fL (ref 80.0–100.0)
Platelets: 55 K/uL — ABNORMAL LOW (ref 150–400)
RBC: 4.35 MIL/uL (ref 3.87–5.11)
RDW: 15.5 % (ref 11.5–15.5)
WBC: 20.5 K/uL — ABNORMAL HIGH (ref 4.0–10.5)
nRBC: 0 % (ref 0.0–0.2)

## 2024-06-23 LAB — RHEUMATOID FACTOR: Rheumatoid fact SerPl-aCnc: 85.3 [IU]/mL — ABNORMAL HIGH (ref <14.0)

## 2024-06-23 LAB — FANA STAINING PATTERNS: Nucleolar Pattern: 8910 — ABNORMAL HIGH

## 2024-06-23 LAB — COMPREHENSIVE METABOLIC PANEL WITH GFR
ALT: 1965 U/L — ABNORMAL HIGH (ref 0–44)
ALT: 1726 U/L — ABNORMAL HIGH (ref 0–44)
AST: 2143 U/L — ABNORMAL HIGH (ref 15–41)
AST: 1255 U/L — ABNORMAL HIGH (ref 15–41)
Albumin: 1.9 g/dL — ABNORMAL LOW (ref 3.5–5.0)
Albumin: 1.5 g/dL — ABNORMAL LOW (ref 3.5–5.0)
Alkaline Phosphatase: 57 U/L (ref 38–126)
Alkaline Phosphatase: 78 U/L (ref 38–126)
Anion gap: 22 — ABNORMAL HIGH (ref 5–15)
Anion gap: 26 — ABNORMAL HIGH (ref 5–15)
BUN: 58 mg/dL — ABNORMAL HIGH (ref 8–23)
BUN: 59 mg/dL — ABNORMAL HIGH (ref 8–23)
CO2: 15 mmol/L — ABNORMAL LOW (ref 22–32)
CO2: 15 mmol/L — ABNORMAL LOW (ref 22–32)
Calcium: 6.5 mg/dL — ABNORMAL LOW (ref 8.9–10.3)
Calcium: 6 mg/dL — CL (ref 8.9–10.3)
Chloride: 95 mmol/L — ABNORMAL LOW (ref 98–111)
Chloride: 91 mmol/L — ABNORMAL LOW (ref 98–111)
Creatinine, Ser: 4.24 mg/dL — ABNORMAL HIGH (ref 0.44–1.00)
Creatinine, Ser: 4.12 mg/dL — ABNORMAL HIGH (ref 0.44–1.00)
GFR, Estimated: 10 mL/min — ABNORMAL LOW (ref >60)
GFR, Estimated: 10 mL/min — ABNORMAL LOW (ref >60)
Glucose, Bld: 138 mg/dL — ABNORMAL HIGH (ref 70–99)
Glucose, Bld: 128 mg/dL — ABNORMAL HIGH (ref 70–99)
Potassium: 5.5 mmol/L — ABNORMAL HIGH (ref 3.5–5.1)
Potassium: 5.7 mmol/L — ABNORMAL HIGH (ref 3.5–5.1)
Sodium: 132 mmol/L — ABNORMAL LOW (ref 135–145)
Sodium: 132 mmol/L — ABNORMAL LOW (ref 135–145)
Total Bilirubin: 2.8 mg/dL — ABNORMAL HIGH (ref 0.0–1.2)
Total Bilirubin: 3.4 mg/dL — ABNORMAL HIGH (ref 0.0–1.2)
Total Protein: 3.8 g/dL — ABNORMAL LOW (ref 6.5–8.1)
Total Protein: 3.3 g/dL — ABNORMAL LOW (ref 6.5–8.1)

## 2024-06-23 LAB — CK: Total CK: 3135 U/L — ABNORMAL HIGH (ref 38–234)

## 2024-06-23 LAB — ABO/RH: ABO/RH(D): O POS

## 2024-06-23 LAB — ANTI-SCLERODERMA ANTIBODY: Scleroderma (Scl-70) (ENA) Antibody, IgG: <0.2 AI (ref 0.0–0.9)

## 2024-06-23 LAB — SALICYLATE LEVEL: Salicylate Lvl: <7 mg/dL — ABNORMAL LOW (ref 7.0–30.0)

## 2024-06-23 LAB — CYCLIC CITRUL PEPTIDE ANTIBODY, IGG/IGA: CCP Antibodies IgG/IgA: 4 U (ref 0–19)

## 2024-06-23 LAB — C4 COMPLEMENT: Complement C4, Body Fluid: 12 mg/dL (ref 12–38)

## 2024-06-23 LAB — ALDOLASE: Aldolase: 83.5 U/L — ABNORMAL HIGH (ref 3.3–10.3)

## 2024-06-23 LAB — C3 COMPLEMENT: C3 Complement: 44 mg/dL — ABNORMAL LOW (ref 82–167)

## 2024-06-23 LAB — ANTINUCLEAR ANTIBODIES, IFA: ANA Ab, IFA: POSITIVE — AB

## 2024-06-23 MED ORDER — STERILE WATER FOR INJECTION IV SOLN
INTRAVENOUS | Status: DC
Start: 2024-06-23 — End: 2024-06-24
  Filled 2024-06-23 (×13): qty 150

## 2024-06-23 MED ORDER — PRISMASOL BGK 4/2.5 32-4-2.5 MEQ/L EC SOLN
Status: DC
Start: 2024-06-23 — End: 2024-06-24

## 2024-06-23 MED ORDER — DEXTROSE 50 % IV SOLN
INTRAVENOUS | Status: AC
  Administered 2024-06-23: 50 mL
  Filled 2024-06-23: qty 50

## 2024-06-23 MED ORDER — VITAMIN K1 10 MG/ML IJ SOLN
10.0000 mg | Freq: Once | INTRAVENOUS | Status: AC
  Administered 2024-06-23: 10 mg via INTRAVENOUS
  Filled 2024-06-23: qty 1

## 2024-06-23 MED ORDER — SODIUM ZIRCONIUM CYCLOSILICATE 5 G PO PACK
5.0000 g | PACK | Freq: Once | ORAL | Status: AC
  Administered 2024-06-23: 5 g
  Filled 2024-06-23: qty 1

## 2024-06-23 MED ORDER — MIDAZOLAM HCL 2 MG/2ML IJ SOLN
INTRAMUSCULAR | Status: AC
  Filled 2024-06-23: qty 2

## 2024-06-23 MED ORDER — SODIUM ZIRCONIUM CYCLOSILICATE 10 G PO PACK
10.0000 g | PACK | Freq: Once | ORAL | Status: AC
  Administered 2024-06-23: 10 g
  Filled 2024-06-23: qty 1

## 2024-06-23 MED ORDER — VITAL 1.5 CAL PO LIQD
1000.0000 mL | ORAL | Status: DC
  Administered 2024-06-23: 1000 mL
  Filled 2024-06-23: qty 1000

## 2024-06-23 MED ORDER — FUROSEMIDE 10 MG/ML IJ SOLN
80.0000 mg | Freq: Once | INTRAMUSCULAR | Status: AC
  Administered 2024-06-23: 80 mg via INTRAVENOUS
  Filled 2024-06-23: qty 8

## 2024-06-23 MED ORDER — THIAMINE MONONITRATE 100 MG PO TABS
100.0000 mg | ORAL_TABLET | Freq: Every day | ORAL | Status: DC
  Administered 2024-06-23 – 2024-06-24 (×2): 100 mg
  Filled 2024-06-23 (×2): qty 1

## 2024-06-23 MED ORDER — MIDAZOLAM HCL 2 MG/2ML IJ SOLN
2.0000 mg | Freq: Once | INTRAMUSCULAR | Status: AC
  Administered 2024-06-23: 2 mg via INTRAVENOUS

## 2024-06-23 MED ORDER — CALCIUM GLUCONATE-NACL 2-0.675 GM/100ML-% IV SOLN
2.0000 g | Freq: Once | INTRAVENOUS | Status: AC
  Administered 2024-06-23: 2000 mg via INTRAVENOUS
  Filled 2024-06-23: qty 100

## 2024-06-23 MED ORDER — AMIODARONE IV BOLUS ONLY 150 MG/100ML
150.0000 mg | Freq: Once | INTRAVENOUS | Status: AC
  Administered 2024-06-23: 150 mg via INTRAVENOUS

## 2024-06-23 MED ORDER — PIPERACILLIN-TAZOBACTAM 3.375 G IVPB
3.3750 g | Freq: Three times a day (TID) | INTRAVENOUS | Status: DC
  Administered 2024-06-23 – 2024-06-24 (×2): 3.375 g via INTRAVENOUS
  Filled 2024-06-23 (×2): qty 50

## 2024-06-23 MED ORDER — SODIUM CHLORIDE 0.9 % IV SOLN
500.0000 [IU]/h | INTRAVENOUS | Status: DC
  Administered 2024-06-23: 500 [IU]/h via INTRAVENOUS_CENTRAL
  Filled 2024-06-23: qty 10000
  Filled 2024-06-23: qty 2

## 2024-06-23 MED ORDER — DEXMEDETOMIDINE HCL IN NACL 400 MCG/100ML IV SOLN
0.0000 ug/kg/h | INTRAVENOUS | Status: DC
  Administered 2024-06-23: 0.5 ug/kg/h via INTRAVENOUS
  Administered 2024-06-23: 0.4 ug/kg/h via INTRAVENOUS
  Filled 2024-06-23 (×2): qty 100

## 2024-06-23 MED ORDER — AMIODARONE LOAD VIA INFUSION
150.0000 mg | Freq: Once | INTRAVENOUS | Status: AC
  Administered 2024-06-23: 150 mg via INTRAVENOUS
  Filled 2024-06-23: qty 83.34

## 2024-06-23 MED ORDER — HEPARIN (PORCINE) 2000 UNITS/L FOR CRRT
INTRAVENOUS_CENTRAL | Status: DC | PRN
Start: 2024-06-23 — End: 2024-06-24

## 2024-06-23 MED ORDER — MAGNESIUM SULFATE 2 GM/50ML IV SOLN
2.0000 g | Freq: Once | INTRAVENOUS | Status: AC
  Administered 2024-06-23: 2 g via INTRAVENOUS
  Filled 2024-06-23: qty 50

## 2024-06-23 MED ORDER — HEPARIN SODIUM (PORCINE) 1000 UNIT/ML DIALYSIS
1000.0000 [IU] | INTRAMUSCULAR | Status: DC | PRN
  Administered 2024-06-24: 2400 [IU] via INTRAVENOUS_CENTRAL
  Filled 2024-06-23: qty 6
  Filled 2024-06-23: qty 3

## 2024-06-23 MED ORDER — HYDROCORTISONE SOD SUC (PF) 100 MG IJ SOLR
50.0000 mg | Freq: Once | INTRAMUSCULAR | Status: AC
  Administered 2024-06-24: 50 mg via INTRAVENOUS
  Filled 2024-06-23: qty 2

## 2024-06-23 NOTE — Consult Note (Signed)
 Reason for Consult: Acute kidney injury on chronic kidney disease stage III Referring Physician: Dorn Chill, MD (CCM)  HPI:  78 year old woman with past medical history significant for COPD, hypothyroidism, pulmonary hypertension, history of PE (anticoagulated with warfarin for 1 year in 2021), peripheral vascular disease and what appears to be underlying chronic kidney disease stage III with baseline creatinine of 1.2-1.6.  Presented to the emergency room about 2 weeks ago with right ankle pain/swelling and found to have a small chip avulsion fracture of the medial malleolus for which she had an ankle wrap and was started on pain medications with outpatient orthopedic follow-up.  She presented to the emergency room yesterday with a 1 day history of nausea, vomiting, diarrhea and abdominal pain after possibly having completed her prednisone  taper quicker than prescribed.  She was brought to the emergency room with hypoglycemia and tachypnea/hypoxia and found to have bibasilar airspace disease with left-sided pleural effusion on chest x-ray with an elevated lactic acid level.  She remained hypotensive after intravenous fluids and was intubated for airway protection after becoming progressively more confused with altered mental status.  She is being treated empirically for acetaminophen  overdose and adrenal insufficiency.  Not previously taking ACE/ARB, MRA, NSAIDs or SGLT2 inhibitor.  Admission labs showed acute kidney injury with a creatinine of 3.0 that has progressively worsened to 4.2 with negligible urine output along with worsening metabolic acidosis and hyperkalemia.  Her transaminases and CPK level are also elevated.  Salicylate and acetaminophen  levels are undetectable.   Past Medical History:  Diagnosis Date   Acute metabolic encephalopathy    Aortic atherosclerosis (HCC) 08/18/2020   Aortic atherosclerosis (HCC)    Arthritis    Chronic lymphocytic thyroiditis 08/18/2020   Coronary  artery calcification    Coronary atherosclerosis    Dyspnea    Emphysema lung (HCC) 08/18/2020   History of pulmonary embolism    Hypothyroidism    Myxedema coma (HCC)    PAD (peripheral artery disease) (HCC)    Pericardial effusion    Pulmonary arterial hypertension (HCC)    Pulmonary embolism (HCC) 08/2020   Sepsis (HCC)    Wears dentures    full upper and lower    Past Surgical History:  Procedure Laterality Date   CATARACT EXTRACTION W/PHACO Right 08/19/2022   Procedure: CATARACT EXTRACTION PHACO AND INTRAOCULAR LENS PLACEMENT (IOC) RIGHT;  Surgeon: Myrna Adine Anes, MD;  Location: [facility];  Service: Ophthalmology;  Laterality: Right;  10.29 1:00.3   CATARACT EXTRACTION W/PHACO Left 10/20/2023   Procedure: CATARACT EXTRACTION PHACO AND INTRAOCULAR LENS PLACEMENT (IOC) LEFT 10.80 01:06.3;  Surgeon: Myrna Adine Anes, MD;  Location: [facility];  Service: Ophthalmology;  Laterality: Left;   CYST EXCISION Left 01/28/2024   Procedure: CYST REMOVAL;  Surgeon: Tye Millet, DO;  Location: [facility];  Service: General;  Laterality: Left;   TUBAL LIGATION      No family history on file.  Social History:  reports that she has been smoking cigarettes. She started smoking about 62 years ago. She has a 61.6 pack-year smoking history. She has never used smokeless tobacco. She reports that she does not drink alcohol  and does not use drugs.  Allergies: No Known Allergies  Medications: I have reviewed the patient's current medications. Scheduled:  arformoterol   15 mcg Nebulization Q12H   budesonide  (PULMICORT ) nebulizer solution  0.5 mg Nebulization BID   Chlorhexidine  Gluconate Cloth  6 each Topical Daily   docusate  100 mg Per Tube BID   [  START ON 07-23-24] hydrocortisone  sodium succinate   50 mg Intravenous Once   levothyroxine   112 mcg Per Tube QAC breakfast   mouth rinse  15 mL Mouth Rinse Q2H   pantoprazole  (PROTONIX ) IV  40 mg Intravenous Q12H   polyethylene  glycol  17 g Per Tube Daily   sodium chloride  flush  10-40 mL Intracatheter Q12H   sodium chloride  flush  3 mL Intravenous Q12H   sodium chloride  HYPERTONIC  4 mL Nebulization Q6H   Continuous:  acetylcysteine  15 mg/kg/hr (06/23/24 0933)   amiodarone  30 mg/hr (06/23/24 0933)   azithromycin  Stopped (06/22/24 2117)   epinephrine  Stopped (06/22/24 9162)   fentaNYL  infusion INTRAVENOUS 200 mcg/hr (06/23/24 0933)   norepinephrine  (LEVOPHED ) Adult infusion 6 mcg/min (06/23/24 0933)   phytonadione  (VITAMIN K ) 10 mg in dextrose  5 % 50 mL IVPB     piperacillin -tazobactam (ZOSYN )  IV Stopped (06/23/24 0604)   sodium bicarbonate  150 mEq in sterile water  1,150 mL infusion 100 mL/hr at 06/23/24 1027   vasopressin  0.04 Units/min (06/23/24 0933)       Latest Ref Rng & Units 06/23/2024    8:12 AM 06/23/2024    4:19 AM 06/23/2024   12:17 AM  BMP  Glucose 70 - 99 mg/dL  861  851   BUN 8 - 23 mg/dL  58  57   Creatinine 9.55 - 1.00 mg/dL  5.75  5.98   Sodium 864 - 145 mmol/L 127  132  133   Potassium 3.5 - 5.1 mmol/L 5.3  5.5  5.6   Chloride 98 - 111 mmol/L  95  96   CO2 22 - 32 mmol/L  15  15   Calcium  8.9 - 10.3 mg/dL  6.5  6.5       Latest Ref Rng & Units 06/23/2024    8:12 AM 06/23/2024    4:19 AM 06/23/2024   12:17 AM  CBC  WBC 4.0 - 10.5 K/uL  19.2  20.5   Hemoglobin 12.0 - 15.0 g/dL 88.0  87.5  87.4   Hematocrit 36.0 - 46.0 % 35.0  39.2  39.9   Platelets 150 - 400 K/uL  47  55    Urinalysis    Component Value Date/Time   COLORURINE YELLOW 06/21/2024 1900   APPEARANCEUR HAZY (A) 06/21/2024 1900   LABSPEC 1.011 06/21/2024 1900   PHURINE 5.0 06/21/2024 1900   GLUCOSEU NEGATIVE 06/21/2024 1900   HGBUR LARGE (A) 06/21/2024 1900   BILIRUBINUR NEGATIVE 06/21/2024 1900   KETONESUR NEGATIVE 06/21/2024 1900   PROTEINUR 100 (A) 06/21/2024 1900   NITRITE NEGATIVE 06/21/2024 1900   LEUKOCYTESUR NEGATIVE 06/21/2024 1900   Creatinine phosphokinase: 5641  ECHOCARDIOGRAM COMPLETE Result  Date: 06/22/2024    ECHOCARDIOGRAM REPORT   Patient Name:   Miranda Cohen Date of Exam: 06/22/2024 Medical Rec #:  [id number]       Height:       65.0 in Accession #:    [id number]      Weight:       196.0 lb Date of Birth:  1946/10/29        BSA:          1.961 m Patient Age:    77 years        BP:           95/22 mmHg Patient Gender: F               HR:  87 bpm. Exam Location:  Inpatient Procedure: 2D Echo, Color Doppler and Cardiac Doppler (Both Spectral and Color            Flow Doppler were utilized during procedure). Indications:    Shock  History:        Patient has prior history of Echocardiogram examinations, most                 recent 08/06/2020. Septic shock.  Sonographer:    Benard Stallion Referring Phys: [id number] NORLEEN D PAYNE IMPRESSIONS  1. Left ventricular ejection fraction, by estimation, is 40 to 45%. The left ventricle has mildly decreased function. The left ventricle demonstrates global hypokinesis. There is moderate concentric left ventricular hypertrophy. Left ventricular diastolic parameters are indeterminate.  2. Right ventricular systolic function is normal. The right ventricular size is normal. There is normal pulmonary artery systolic pressure.  3. Moderate pericardial effusion. The pericardial effusion is posterior to the left ventricle and circumferential.  4. The mitral valve is degenerative. Trivial mitral valve regurgitation. Moderate mitral annular calcification.  5. The aortic valve is tricuspid. Aortic valve regurgitation is not visualized. No aortic stenosis is present. Comparison(s): Prior images reviewed side by side. Left ventricular function has worsened. Pericardial effusion has decreased. FINDINGS  Left Ventricle: Left ventricular ejection fraction, by estimation, is 40 to 45%. The left ventricle has mildly decreased function. The left ventricle demonstrates global hypokinesis. The left ventricular internal cavity size was normal in size. There is  moderate  concentric left ventricular hypertrophy. Left ventricular diastolic parameters are indeterminate. Right Ventricle: The right ventricular size is normal. No increase in right ventricular wall thickness. Right ventricular systolic function is normal. There is normal pulmonary artery systolic pressure. The tricuspid regurgitant velocity is 1.54 m/s, and  with an assumed right atrial pressure of 8 mmHg, the estimated right ventricular systolic pressure is 17.5 mmHg. Left Atrium: Left atrial size was normal in size. Right Atrium: Right atrial size was normal in size. Pericardium: A moderately sized pericardial effusion is present. The pericardial effusion is posterior to the left ventricle and circumferential. Mitral Valve: The mitral valve is degenerative in appearance. Moderate mitral annular calcification. Trivial mitral valve regurgitation. The mean mitral valve gradient is 2.0 mmHg with average heart rate of 87 bpm. Tricuspid Valve: The tricuspid valve is normal in structure. Tricuspid valve regurgitation is not demonstrated. No evidence of tricuspid stenosis. Aortic Valve: The aortic valve is tricuspid. Aortic valve regurgitation is not visualized. No aortic stenosis is present. Aortic valve mean gradient measures 2.0 mmHg. Aortic valve peak gradient measures 4.0 mmHg. Aortic valve area, by VTI measures 3.41 cm. Pulmonic Valve: The pulmonic valve was not well visualized. Pulmonic valve regurgitation is mild. No evidence of pulmonic stenosis. Aorta: The aortic root is normal in size and structure. IAS/Shunts: The atrial septum is grossly normal.  LEFT VENTRICLE PLAX 2D LVIDd:         3.40 cm     Diastology LVIDs:         2.90 cm     LV e' medial:    5.00 cm/s LV PW:         1.40 cm     LV E/e' medial:  17.6 LV IVS:        1.40 cm     LV e' lateral:   5.44 cm/s LVOT diam:     2.10 cm     LV E/e' lateral: 16.2 LV SV:  64 LV SV Index:   33 LVOT Area:     3.46 cm  LV Volumes (MOD) LV vol d, MOD A2C: 47.0 ml LV  vol d, MOD A4C: 31.1 ml LV vol s, MOD A2C: 25.4 ml LV vol s, MOD A4C: 19.6 ml LV SV MOD A2C:     21.6 ml LV SV MOD A4C:     31.1 ml LV SV MOD BP:      16.7 ml RIGHT VENTRICLE RV Basal diam:  3.30 cm RV Mid diam:    2.80 cm RV S prime:     15.30 cm/s TAPSE (M-mode): 1.4 cm LEFT ATRIUM             Index        RIGHT ATRIUM           Index LA diam:        3.90 cm 1.99 cm/m   RA Area:     15.40 cm LA Vol (A2C):   47.8 ml 24.37 ml/m  RA Volume:   32.70 ml  16.67 ml/m LA Vol (A4C):   30.1 ml 15.35 ml/m LA Biplane Vol: 38.9 ml 19.84 ml/m  AORTIC VALVE AV Area (Vmax):    3.29 cm AV Area (Vmean):   3.19 cm AV Area (VTI):     3.41 cm AV Vmax:           100.00 cm/s AV Vmean:          70.600 cm/s AV VTI:            0.188 m AV Peak Grad:      4.0 mmHg AV Mean Grad:      2.0 mmHg LVOT Vmax:         94.90 cm/s LVOT Vmean:        65.000 cm/s LVOT VTI:          0.185 m LVOT/AV VTI ratio: 0.98  AORTA Ao Root diam: 3.00 cm MITRAL VALVE                TRICUSPID VALVE MV Area (PHT): 3.77 cm     TR Peak grad:   9.5 mmHg MV Mean grad:  2.0 mmHg     TR Vmax:        154.00 cm/s MV Decel Time: 201 msec MV E velocity: 88.00 cm/s   SHUNTS MV A velocity: 129.00 cm/s  Systemic VTI:  0.18 m MV E/A ratio:  0.68         Systemic Diam: 2.10 cm Stanly Leavens MD Electronically signed by Stanly Leavens MD Signature Date/Time: 06/22/2024/1:31:09 PM    Final    DG CHEST PORT 1 VIEW Result Date: 06/22/2024 CLINICAL DATA:  Acute respiratory failure. EXAM: PORTABLE CHEST 1 VIEW COMPARISON:  Radiographs 06/21/2024 and 08/06/2020.  CT 06/21/2024. FINDINGS: 0413 hours. No significant change in position of the endotracheal tube, left IJ central venous catheter and enteric tube (tip of the latter below the diaphragm, not visualized). Tip of the central line overlies the azygous vein interval partial re-expansion of the left lung with decreased mediastinal shift to the left. There is residual left lower lobe airspace disease and a  possible small left pleural effusion. The right lung appears clear. No evidence of pneumothorax. The heart size and mediastinal contours stable. IMPRESSION: 1. Interval partial re-expansion of the left lung with decreased mediastinal shift to the left. Residual left lower lobe airspace disease and possible small left pleural effusion. 2. Stable support system. Electronically Signed  By: Elsie Perone M.D.   On: 06/22/2024 09:30   DG CHEST PORT 1 VIEW Result Date: 06/22/2024 EXAM: 1 VIEW XRAY OF THE CHEST 06/22/2024 12:03:00 AM COMPARISON: CT chest dated 06/21/2024. CLINICAL HISTORY: Encounter for central line placement; encounter for orogastric (OG) tube placement; encounter for central line and intubation. FINDINGS: LUNGS AND PLEURA: Near complete opacification of the left hemithorax, likely reflecting atelectasis. No pneumothorax. HEART AND MEDIASTINUM: No acute abnormality of the cardiac and mediastinal silhouettes. BONES AND SOFT TISSUES: No acute osseous abnormality. LINES AND TUBES: Endotracheal tube terminates 3.5 cm above the carina, in satisfactory position. Enteric tube courses into the stomach. Left IJ venous catheter is likely in the distal SVC, but does not cross midline, probably due to leftward cardiomediastinal shift. IMPRESSION: 1. Near complete opacification of the left hemithorax, likely reflecting atelectasis. 2. Endotracheal tube in satisfactory position. 3. Left IJ venous catheter does not cross midline, but is likely in the distal SVC. Electronically signed by: Pinkie Pebbles MD 06/22/2024 12:16 AM EDT RP Workstation: [id number]   DG Abd 1 View Result Date: 06/22/2024 CLINICAL DATA:  OG tube placement EXAM: ABDOMEN - 1 VIEW COMPARISON:  CT 06/21/2024 FINDINGS: Enteric tube tip at the distal stomach. Airspace disease at the left base. IMPRESSION: Enteric tube tip at the distal stomach. Electronically Signed   By: Luke Bun M.D.   On: 06/22/2024 00:11   CT CHEST ABDOMEN PELVIS WO  CONTRAST Result Date: 06/21/2024 EXAM: CT CHEST, ABDOMEN AND PELVIS WITHOUT CONTRAST 06/21/2024 10:32:48 PM TECHNIQUE: CT of the chest, abdomen and pelvis was performed without the administration of intravenous contrast. Multiplanar reformatted images are provided for review. Automated exposure control, iterative reconstruction, and/or weight based adjustment of the mA/kV was utilized to reduce the radiation dose to as low as reasonably achievable. COMPARISON: None available. CLINICAL HISTORY: ? pna sepsis. Chief complaints; Hypoglycemia; CT CHEST ABDOMEN PELVIS WO CONTRAST; ? pna sepsis; CT HEAD WO CONTRAST ( ); Mental status change, persistent or worsening FINDINGS: CHEST: MEDIASTINUM: Small pericardial effusion with leftward cardiomediastinal shift. Endotracheal tube preferentially intubates the right mainstem bronchus, 1 cm below the carina. Withdrawal 5 cm is suggested. THORACIC LYMPH NODES: No mediastinal, hilar or axillary lymphadenopathy. LUNGS AND PLEURA: Patchy left upper lobe opacity with volume loss, suggesting atelectasis. Additional left lower lobe opacity, likely atelectasis. Small right pleural effusion with right basilar atelectasis. Mild centrilobular and paraseptal emphysematous changes, upper lung predominant. No pneumothorax. ABDOMEN AND PELVIS: LIVER: The liver is unremarkable. GALLBLADDER AND BILE DUCTS: Gallbladder is unremarkable. No biliary ductal dilatation. SPLEEN: No acute abnormality. PANCREAS: No acute abnormality. ADRENAL GLANDS: No acute abnormality. KIDNEYS, URETERS AND BLADDER: No stones in the kidneys or ureters. No hydronephrosis. No perinephric or periureteral stranding. Urinary bladder is decompressed by indwelling Foley catheter. GI AND BOWEL: Stomach demonstrates no acute abnormality. There is no bowel obstruction. Sigmoid diverticulosis, without evidence of diverticulitis. Appendix is not discretely visualized. REPRODUCTIVE ORGANS: Uterus within normal limits. PERITONEUM  AND RETROPERITONEUM: Trace pelvic ascites. No free air. VASCULATURE: Thoracic aortic atherosclerosis. Mild coronary atherosclerosis of the LAD and left circumflex. Atherosclerotic calcifications of the abdominal aorta and branch vessels. ABDOMINAL AND PELVIS LYMPH NODES: No lymphadenopathy. BONES AND SOFT TISSUES: Mild degenerative changes of the visualized thoracolumbar spine. No focal soft tissue abnormality. IMPRESSION: 1. Endotracheal tube preferentially intubates the right mainstem bronchus, 1 cm below the carina. Withdrawal 5 cm is suggested. 2. Associated left lung atelectasis with volume loss. 3. Small pericardial effusion. Small right pleural effusion. 4. No acute findings  in the abdomen/pelvis. Electronically signed by: Pinkie Pebbles MD 06/21/2024 10:53 PM EDT RP Workstation: [id number]   CT HEAD WO CONTRAST ( ) Result Date: 06/21/2024 EXAM: CT HEAD WITHOUT CONTRAST 06/21/2024 10:32:48 PM TECHNIQUE: CT of the head was performed without the administration of intravenous contrast. Automated exposure control, iterative reconstruction, and/or weight based adjustment of the mA/kV was utilized to reduce the radiation dose to as low as reasonably achievable. COMPARISON: None available. CLINICAL HISTORY: Mental status change, persistent or worsening. Chief complaints; Hypoglycemia; CT CHEST ABDOMEN PELVIS WO CONTRAST; ? pna sepsis; CT HEAD WO CONTRAST ( ); Mental status change, persistent or worsening. FINDINGS: BRAIN AND VENTRICLES: No acute hemorrhage. Gray-white differentiation is preserved. No hydrocephalus. No extra-axial collection. No mass effect or midline shift. Mild subcortical and periventricular small vessel ischemic changes. ORBITS: No acute abnormality. SINUSES: No acute abnormality. SOFT TISSUES AND SKULL: No acute soft tissue abnormality. No skull fracture. Mild intracranial atherosclerosis. IMPRESSION: 1. No acute intracranial abnormality. 2. Mild small vessel ischemic changes.  Electronically signed by: Pinkie Pebbles MD 06/21/2024 10:44 PM EDT RP Workstation: [id number]   DG CHEST PORT 1 VIEW Result Date: 06/21/2024 CLINICAL DATA:  Altered mental status intubated EXAM: PORTABLE CHEST 1 VIEW COMPARISON:  06/21/2024, chest CT 04/26/2022 FINDINGS: Interval intubation, tip of the endotracheal tube is partially obscured by telemetry lead, it appears to be near the carina, just above right mainstem bronchus. Cardiomegaly with vascular congestion and edema. Dense airspace disease is suspected at the left base. Probable pleural effusions. Aortic atherosclerosis. IMPRESSION: 1. Interval intubation, tip of the endotracheal tube is partially obscured by telemetry lead, it appears to be near the carina, just above right mainstem bronchus. Recommend retraction. 2. Cardiomegaly with vascular congestion and edema. Dense airspace disease at the left base, atelectasis versus pneumonia. Probable pleural effusions. Electronically Signed   By: Luke Bun M.D.   On: 06/21/2024 22:25   DG Chest Portable 1 View Result Date: 06/21/2024 CLINICAL DATA:  Question pulmonary edema EXAM: PORTABLE CHEST 1 VIEW COMPARISON:  06/21/2024 FINDINGS: Enlarged cardiac silhouette. There is bibasilar airspace disease. LEFT lung base is poorly visualized. Probable LEFT effusion. No pneumothorax. IMPRESSION: 1. Bibasilar airspace disease and LEFT effusion. 2. Cardiomegaly. Electronically Signed   By: Jackquline Boxer M.D.   On: 06/21/2024 19:56   DG Chest Port 1 View Result Date: 06/21/2024 EXAM: 1 VIEW XRAY OF THE CHEST 06/21/2024 03:39:00 PM COMPARISON: Chest radiograph of 08/10/2020. CLINICAL HISTORY: Questionable sepsis - evaluate for abnormality. Reason for exam: Questionable sepsis; Triage notes: ; Pt bib EMS from home for complaint of AMS and hypoglycemia. Per family patient LKW was around 2200 last night. Pt was complaining of N/V/D and chills yesterday. Woke up this morning confused. Per EMS pt was taking  prednisone  for ankle injury but may ; have taken course too quickly. CBG on EMS arrival was 31. Pt received 25g of D10 PTA. Last CBG for EMS was 81. Pt wears 4L [place] at baseline. FINDINGS: LUNGS AND PLEURA: Small to moderate left pleural effusion. A skin fold over the inferior lateral right hemithorax. Mild pulmonary interstitial thickening/coarsening, nonspecific. Moderate lower lung predominant left-sided airspace disease is increased. Improved right base airspace disease/atelectasis. HEART AND MEDIASTINUM: Moderate cardiomegaly. BONES AND SOFT TISSUES: No acute osseous abnormality. IMPRESSION: 1. Increased small-to-moderate left pleural effusion with worsened adjacent atelectasis or infection. 2. Improved right base aeration, likely atelectasis 3. Moderate cardiomegaly without congestive heart failure. Electronically signed by: Rockey Kilts MD 06/21/2024 03:49 PM EDT RP Workstation: [id number]  Review of Systems  Unable to perform ROS: Intubated   Blood pressure (!) 158/41, pulse 86, temperature 99.5 F (37.5 C), resp. rate (!) 22, height 5' 6 (1.676 m), weight 93.9 kg, SpO2 100%. Physical Exam Vitals reviewed.  Constitutional:      Appearance: She is obese. She is ill-appearing.  HENT:     Head: Atraumatic.     Right Ear: External ear normal.     Left Ear: External ear normal.     Nose: Nose normal.     Mouth/Throat:     Comments: Intubated Eyes:     Conjunctiva/sclera: Conjunctivae normal.  Cardiovascular:     Rate and Rhythm: Normal rate and regular rhythm.     Pulses: Normal pulses.     Heart sounds: Normal heart sounds.  Pulmonary:     Breath sounds: Normal breath sounds.     Comments: On ventilator, anterior chest Abdominal:     General: Abdomen is flat. Bowel sounds are normal.     Palpations: Abdomen is soft.  Musculoskeletal:     Cervical back: Neck supple.     Right lower leg: No edema.     Left lower leg: No edema.  Skin:    General: Skin is dry.     Comments: Upper  extremities/hands appear cyanotic and are cold to touch.  Dusky bilateral lower extremities also cool to touch.  Dressing over right upper arm     Assessment/Plan: 1.  Acute kidney injury: Appears consistent with ATN probably with provoking infection +/- hypotension related to adrenal insufficiency.  Will repeat CPK level to see if rhabdomyolysis has an additional role in contributing to her acute kidney injury.  Will obtain renal ultrasound to evaluate renal size and structure and rule out any obstruction although she has a Foley catheter in place.  Unfortunately with worsening acidosis and hyperkalemia in spite of aggressive medical measures it management overnight, I fear that she will need renal replacement therapy at this time if labs today at noon continue to show decompensation/worsening. 2.  Anion gap metabolic acidosis: Secondary to acute kidney injury +/- lactic acidosis from hypoperfusion.  Refractory to medical management and will need renal replacement therapy.  If indeed she has had poor nutritional intake with concomitant (chronic) Tylenol  use, there is a possibility of pyroglutamic acid contributing to her metabolic acidosis. 3.  Hyperkalemia: Secondary to acute kidney injury, attempting medical management and may need RRT if unsuccessful. 4.  Acute metabolic encephalopathy/shock/elevated LFTs: Started on empiric therapy for infection process suspected to be of pulmonary origin along with supportive measures for shock.  Also on empiric treatment for acetaminophen  toxicity with acetylcysteine  infusion.  Miranda Cohen 06/23/2024, 10:43 AM

## 2024-06-23 NOTE — Progress Notes (Signed)
 RUE arterial exam is completed. Chanta Bauers, RVT

## 2024-06-23 NOTE — Progress Notes (Signed)
 eLink Physician-Brief Progress Note Patient Name: Miranda Cohen DOB: 1946-10-04 MRN: [id number]   Date of Service  06/23/2024  HPI/Events of Note  Patient back in atrial fibrillation with RVR despite Amiodarone  gtt.  eICU Interventions  Amiodarone  150 mg iv bolus ordered.        Miranda Cohen 06/23/2024, 5:29 AM

## 2024-06-23 NOTE — Plan of Care (Addendum)
 Spoke with pt's daughter, Miranda Cohen via phone. Provided updates including potential need for dialysis today. She states that her mom would want dialysis if it was necessary and she would like to proceed with dialysis if pt needs it.  She plans to speak with her brothers regarding goals of care discussions, she does not think her mom would want to be on life support for a prolonged amount of time. I encouraged her to start having these discussions with family.  Pt's daughter and son plan to visit later today.  I have consulted palliative care to assist family with these discussions.

## 2024-06-23 NOTE — Procedures (Signed)
 Central Venous Catheter Insertion Procedure Note  Miranda Cohen  [id number]  10/30/1946  Date:06/23/24  Time:3:43 PM   Provider Performing:Isamu Trammel B Kara   Procedure: Insertion of Non-tunneled Central Venous [phone]) with US  guidance ([id number])   Indication(s) Hemodialysis  Consent Risks of the procedure as well as the alternatives and risks of each were explained to the patient and/or caregiver.  Consent for the procedure was obtained and is signed in the bedside chart  Anesthesia Topical only with 1% lidocaine    Timeout Verified patient identification, verified procedure, site/side was marked, verified correct patient position, special equipment/implants available, medications/allergies/relevant history reviewed, required imaging and test results available.  Sterile Technique Maximal sterile technique including full sterile barrier drape, hand hygiene, sterile gown, sterile gloves, mask, hair covering, sterile ultrasound probe cover (if used).  Procedure Description Area of catheter insertion was cleaned with chlorhexidine  and draped in sterile fashion.  With real-time ultrasound guidance a HD catheter was placed into the right internal jugular vein. Nonpulsatile blood flow and easy flushing noted in all ports.  The catheter was sutured in place and sterile dressing applied.  Complications/Tolerance None; patient tolerated the procedure well. Chest X-ray is ordered to verify placement for internal jugular or subclavian cannulation.   Chest x-ray is not ordered for femoral cannulation.  EBL Minimal  Specimen(s) None

## 2024-06-23 NOTE — TOC CM/SW Note (Signed)
 Transition of Care [facility]) - Inpatient Brief Assessment   Patient Details  Name: Miranda Cohen MRN: [id number] Date of Birth: [birth year]  [facility] CM/SW Contact:    Andrez [name], RN Phone Number: 06/23/2024, 12:36 PM   Clinical Narrative:  On Ventilator. Palliative care consulted for GOC.  IP Care Management following.  Transition of Care Asessment: Insurance and Status: Insurance coverage has been reviewed Patient has primary care physician: Yes Home environment has been reviewed: home with family   Prior/Current Home Services: No current home services Social Drivers of Health Review: SDOH reviewed no interventions necessary Readmission risk has been reviewed: Yes Transition of care needs: transition of care needs identified, TOC will continue to follow

## 2024-06-23 NOTE — Progress Notes (Signed)
 PM follow up labs AST/ALT improved 2900/1900> 1200/1700 INR 2.2>1.9  Norepinephrine  up to 79mcg/min and vasopressin  0.04units/min  Started on IV N-acetylcysteine  7/22 at 5pm  Spoke to Poison control 7/23 at 1930 Meets criteria to stop N-acetylcysteine    Confirmed with E-Link  Olam Chalk Pharm.D. CPP, BCPS Clinical Pharmacist [phone] 06/23/2024 7:55 PM

## 2024-06-23 NOTE — Progress Notes (Signed)
 NAME:  Miranda Cohen, MRN:  [id number], DOB:  1946-09-09, LOS: 2 ADMISSION DATE:  06/21/2024  History of Present Illness:  78 yo F w/ pertinent PMH tobacco abuse, emphysema, previous PE 2021 on warfarin for 1 year, hypothyroidism, PAH, PAD presents to [facility] on 7/21 w/ AMS.   Patient recently seen by ED on 7/11 for right ankle pain/swelling. Dvt us  negative. Xray showing small bone fragment possible avulsion fracture. Sent home w/ pain meds and ankle wrapped. F/u w/ ortho on 7/14 who rx prednisone  taper for 10 days and hydrocodone.   On 7/21 patient having acute N/V/Diarrhea and abd pain in am. Per patient's daughter patient took her steroid taper much quicker than was prescribed. EMS called and cbg 30 given dextrose . Brought to [facility] ED. Placed on 4L [place] sats 90%. Patient tachypneic 20-30s w/ congested cough. Patient bp initially stable but became hypotensive. Temp 44F and wbc 21. Cultures obtained and given iv fluids. CXR w/ bibasilar disease and left pleural effusion. Started on rocephin /azithro. Covid/flu negative. LA 6 then 7.1. UA w/ rare bacteria. Despite iv fluids patient remained hypotensive requiring levo. Patient with increasing sob and having becoming more altered/confused. Placed on bipap but unable to tolerate. Patient intubated. PCCM consulted for icu admission.   Of note, pt's RF lab test was high on 06/15/24, ANA negative, elevated CRP and sed rate.   Pertinent  Medical History  Emphysema on 4L [place] at baseline Chronic lymphocytic thyroiditis 2021 Hx PE 2021 Hypothyroidism, myxedema coma PAD PAH  Significant Hospital Events: Including procedures, antibiotic start and stop dates in addition to other pertinent events   7/21 admitted for ams and shock; worsening resp failure now intubated  7/22 amiodarone  gtt started for afib with rvr, acetylcysteine  started for suspected tylenol  toxicity, poison control contacted   Interim History / Subjective:  Received amiodarone  bolus overnight.  Remains sedated and intubated.  Objective    Blood pressure (!) 158/41, pulse 86, temperature 99.5 F (37.5 C), resp. rate (!) 22, height 5' 6 (1.676 m), weight 93.9 kg, SpO2 100%. CVP:  [2 mmHg-51 mmHg] 5 mmHg  Vent Mode: PRVC FiO2 (%):  [60 %-70 %] 60 % Set Rate:  [22 bmp-26 bmp] 22 bmp Vt Set:  [460 mL] 460 mL PEEP:  [8 cmH20] 8 cmH20 Plateau Pressure:  [17 cmH20-21 cmH20] 19 cmH20   Intake/Output Summary (Last 24 hours) at 06/23/2024 1020 Last data filed at 06/23/2024 0933 Gross per 24 hour  Intake 5215.94 ml  Output 414 ml  Net 4801.94 ml   Filed Weights   06/21/24 2350 06/22/24 0432 06/23/24 0500  Weight: 88.9 kg 88.9 kg 93.9 kg    Examination: General: Ill appearing HENT: very mild scleral icterus, ncat Lungs: intubated, symmetric chest rise/fall, no wheezing Cardiovascular: irregularly irregular Abdomen: soft, diffusely tender to palpation Extremities: mottled skin to BUE (R>L), BLE, abdomen. Unable to palpate radial pulses Neuro: opens eyes to pain, unable to follow commands  Resolved problem list  Hypoglycemia Assessment and Plan  Shock, Metabolic acidosis Elevated LFTs Elevated LFTs likely in setting of shock, initially presumed septic 2/2 pulmonary source. Increased suspicion for accidental tylenol  overdose, after discussion with daughter it sounds like pt may have been mismanaging medications at home and was recently taking tylenol  for ankle pain. Also consider adrenal insufficiency 2/2 steroid misuse, although less likely as pt was prescribed very low dose of prednisone . Cortisol elevated although drawn after steroid stress dosing was started.  - Poison control following - Continue IV acetylcysteine  (  7/22-) - on levophed , vasopressin , and epinephrine  gtt - vit K 10mg   - wean for map goal >65 - stress dose steroids 100mg  today then 50mg  x1 tomorrow to taper - Sodium bicarb 150meq at 143ml/h - trend LFTs, INR - f/u blood cultures   Acute encephalopathy  2/2 above - limit sedating meds  Acute hypoxic hypercarbic respiratory failure, suspected CAP Hx emphysema PAH  Suspected component of septic shock from CAP - Zosyn , Azithromycin  (7/21-) for pneumonia coverage - continue vent support, wean as able - monitor ABG - brovana , pulmicort  nebs, prn duoneb - hypertonic NaCl neb q6h - pulmonary hygiene - f/u tracheal aspirate    Afib with RVR No history of atrial fibrillation. Echo 7/22 with new LVEF 40-45%, LV global hypokinesis, moderate LV hypertrophy, normal pulmonary artery systolic pressure. Moderate pericardial effusion noted, previously seen on echo in 2021. BNP elevated at 3589.  - Amiodarone  gtt - bolus over 30 minutes   AKI on CKD3a Appears baseline creatinine 1.6, increased to 4.2 this morning. Pt still has not had urinary output. Bladder scan showed 0ml yesterday. She is net +10L.  - Lasix  80mg  x1 - trend BMP, UOP - avoid nephrotoxic agents  Cold extremities Bilateral hands have been cold since admission with mottling over BUE, BLE, and abdomen. Mottling initially improved but seems to have worsened on the RUE overnight. Hesitant to start heparin  gtt again due to active bleeding when she was initially on it. Would like to limit contrast exposure as much as possible due to renal function.  - RUE arterial doppler  Hypothyroidism - continue home synthroid  112mcg daily   PAD - Holding home statin due to LFTs  Best Practice (right click and Reselect all SmartList Selections daily)   Diet/type: NPO DVT prophylaxis SCD Pressure ulcer(s): N/A GI prophylaxis: PPI Lines: Central line and Arterial Line Foley:  Yes, and it is still needed Code Status:  full code Last date of multidisciplinary goals of care discussion []   Labs   CBC: Recent Labs  Lab 06/22/24 0330 06/22/24 0356 06/22/24 1304 06/22/24 1616 06/22/24 1814 06/23/24 0017 06/23/24 0419 06/23/24 0812  WBC 37.0*  --  18.5*  --  15.5* 20.5* 19.2*  --    NEUTROABS  --   --   --   --   --   --  18.4*  --   HGB 15.6*   < > 12.2 13.6 11.8* 12.5 12.4 11.9*  HCT 51.6*   < > 39.4 40.0 37.5 39.9 39.2 35.0*  MCV 95.4  --  92.9  --  91.9 91.7 91.4  --   PLT 164  --  75*  72*  --  59* 55* 47*  --    < > = values in this interval not displayed.    Basic Metabolic Panel: Recent Labs  Lab 06/22/24 0330 06/22/24 0356 06/22/24 0817 06/22/24 0902 06/22/24 1015 06/22/24 1051 06/22/24 1401 06/22/24 1616 06/22/24 2312 06/23/24 0017 06/23/24 0419 06/23/24 0812  NA 131*   < > 130*   < >  --    < > 132* 134*  --  133* 132* 127*  K 5.8*   < > 5.5*   < >  --    < > 5.3* 5.5*  --  5.6* 5.5* 5.3*  CL 105  --  100  --   --   --  100  --   --  96* 95*  --   CO2 15*  --  11*  --   --   --  16*  --   --  15* 15*  --   GLUCOSE 313*  --  299*  --   --   --  169*  --   --  148* 138*  --   BUN 48*  --  49*  --   --   --  53*  --   --  57* 58*  --   CREATININE 3.61*  --  3.94*  --   --   --  3.76*  --   --  4.01* 4.24*  --   CALCIUM  6.8*  --  6.8*  --   --   --  6.6*  --   --  6.5* 6.5*  --   MG  --   --   --   --  1.7  --   --   --  2.1  --   --   --   PHOS  --   --   --   --  6.8*  --   --   --  6.0*  --   --   --    < > = values in this interval not displayed.   GFR: Estimated Creatinine Clearance: 12.4 mL/min (A) (by C-G formula based on SCr of 4.24 mg/dL (H)). Recent Labs  Lab 06/22/24 0044 06/22/24 0330 06/22/24 0853 06/22/24 1218 06/22/24 1304 06/22/24 1453 06/22/24 1814 06/23/24 0017 06/23/24 0419  WBC  --    < >  --   --  18.5*  --  15.5* 20.5* 19.2*  LATICACIDVEN 3.5*  --  6.2* 4.5*  --  3.9*  --   --   --    < > = values in this interval not displayed.    Liver Function Tests: Recent Labs  Lab 06/21/24 1525 06/22/24 0330 06/22/24 0817 06/22/24 1401 06/23/24 0419  AST 170* 271* 512* 2,960* 2,143*  ALT 109* 193* 516* 1,959* 1,965*  ALKPHOS 47 51 39 39 57  BILITOT 0.9 1.0 1.4* 1.8* 2.8*  PROT 4.3* 4.6* 4.2* 4.2* 3.8*   ALBUMIN  1.9* 1.8* 2.2* 2.5* 1.9*   Recent Labs  Lab 06/22/24 1015  LIPASE 25   Recent Labs  Lab 06/22/24 0945  AMMONIA 80*    ABG    Component Value Date/Time   PHART 7.195 (LL) 06/23/2024 0812   PCO2ART 45.4 06/23/2024 0812   PO2ART 109 (H) 06/23/2024 0812   HCO3 17.4 (L) 06/23/2024 0812   TCO2 19 (L) 06/23/2024 0812   ACIDBASEDEF 10.0 (H) 06/23/2024 0812   O2SAT 97 06/23/2024 0812     Coagulation Profile: Recent Labs  Lab 06/21/24 1525 06/22/24 1304 06/23/24 0419  INR 1.4* 2.8* 2.2*    Cardiac Enzymes: Recent Labs  Lab 06/22/24 0945  CKTOTAL 5,641*    HbA1C: Hgb A1c MFr Bld  Date/Time Value Ref Range Status  06/22/2024 10:15 AM 6.3 (H) 4.8 - 5.6 % Final    Comment:    (NOTE) Diagnosis of Diabetes The following HbA1c ranges recommended by the American Diabetes Association (ADA) may be used as an aid in the diagnosis of diabetes mellitus.  Hemoglobin             Suggested A1C NGSP%              Diagnosis  <5.7                   Non Diabetic  5.7-6.4  Pre-Diabetic  >6.4                   Diabetic  <7.0                   Glycemic control for                       adults with diabetes.    08/06/2020 07:00 AM 6.0 (H) 4.8 - 5.6 % Final    Comment:    (NOTE) Pre diabetes:          5.7%-6.4%  Diabetes:              >6.4%  Glycemic control for   <7.0% adults with diabetes     CBG: Recent Labs  Lab 06/22/24 1145 06/22/24 1538 06/22/24 2048 06/22/24 2336 06/23/24 0421  GLUCAP 205* 154* 152* 149* 142*    Review of Systems:   Intubated  Past Medical History:  She,  has a past medical history of Acute metabolic encephalopathy, Aortic atherosclerosis (HCC) (08/18/2020), Aortic atherosclerosis (HCC), Arthritis, Chronic lymphocytic thyroiditis (08/18/2020), Coronary artery calcification, Coronary atherosclerosis, Dyspnea, Emphysema lung (HCC) (08/18/2020), History of pulmonary embolism, Hypothyroidism, Myxedema coma (HCC),  PAD (peripheral artery disease) (HCC), Pericardial effusion, Pulmonary arterial hypertension (HCC), Pulmonary embolism (HCC) (08/2020), Sepsis (HCC), and Wears dentures.   Surgical History:   Past Surgical History:  Procedure Laterality Date   CATARACT EXTRACTION W/PHACO Right 08/19/2022   Procedure: CATARACT EXTRACTION PHACO AND INTRAOCULAR LENS PLACEMENT (IOC) RIGHT;  Surgeon: Myrna Adine Anes, MD;  Location: [facility];  Service: Ophthalmology;  Laterality: Right;  10.29 1:00.3   CATARACT EXTRACTION W/PHACO Left 10/20/2023   Procedure: CATARACT EXTRACTION PHACO AND INTRAOCULAR LENS PLACEMENT (IOC) LEFT 10.80 01:06.3;  Surgeon: Myrna Adine Anes, MD;  Location: [facility];  Service: Ophthalmology;  Laterality: Left;   CYST EXCISION Left 01/28/2024   Procedure: CYST REMOVAL;  Surgeon: Tye Millet, DO;  Location: [facility];  Service: General;  Laterality: Left;   TUBAL LIGATION       Social History:   reports that she has been smoking cigarettes. She started smoking about 62 years ago. She has a 61.6 pack-year smoking history. She has never used smokeless tobacco. She reports that she does not drink alcohol  and does not use drugs.   Family History:  Her family history is not on file.   Allergies No Known Allergies   Home Medications  Prior to Admission medications   Medication Sig Start Date End Date Taking? Authorizing Provider  atorvastatin (LIPITOR) 20 MG tablet Take 20 mg by mouth daily. 12/16/23 12/15/24 Yes [provider]  diclofenac Sodium (VOLTAREN) 1 % GEL Apply 2 g topically daily as needed (Pain). 12/16/23  Yes [provider]  furosemide  (LASIX ) 20 MG tablet Take 20 mg by mouth daily as needed for edema.   Yes [provider]  HYDROcodone-acetaminophen  (NORCO/VICODIN) 5-325 MG tablet Take 1 tablet by mouth 4 (four) times daily as needed (for pain).   Yes [provider]  levothyroxine  (SYNTHROID ) 112 MCG tablet Take 112  mcg by mouth daily before breakfast. 02/12/22  Yes [provider]  mometasone -formoterol  (DULERA ) 200-5 MCG/ACT AERO Inhale 2 puffs into the lungs 2 (two) times daily.   Yes [provider]  OXYGEN Inhale 4 L/min into the lungs continuous.   Yes [provider]  Cholecalciferol (VITAMIN D3) 75 MCG (3000 UT) TABS Take 3,000 mcg by mouth in the morning.  Patient not taking: Reported on 06/21/2024 01/28/24   Sakai, Isami, DO  cyanocobalamin (VITAMIN B12) 1000 MCG tablet Take 3 tablets (3,000 mcg total) by mouth in the morning. Patient not taking: Reported on 06/21/2024 01/28/24   Sakai, Isami, DO  traMADol  (ULTRAM ) 50 MG tablet Take 1 tablet (50 mg total) by mouth every 6 (six) hours as needed for up to 8 doses. Patient not taking: Reported on 06/21/2024 01/28/24   Sakai, Isami, DO     Critical care time: 30 minutes

## 2024-06-23 NOTE — Progress Notes (Signed)
 Initial Nutrition Assessment  DOCUMENTATION CODES:  Not applicable  INTERVENTION:  Initiate trickle TF via OGT: Vital 1.5 at 14ml/hr   Start thiamine  100 mg x 7 days given risk for refeeding.   Would recommend holding on tube feeding advancement at this time, given medical instability.   NUTRITION DIAGNOSIS:  Inadequate oral intake related to acute illness as evidenced by NPO status.  GOAL:  Patient will meet greater than or equal to 90% of their needs  MONITOR:  Vent status, Labs, Weight trends, TF tolerance, Skin, I & O's  REASON FOR ASSESSMENT:  Ventilator, Consult Assessment of nutrition requirement/status (CRRT protocol)  ASSESSMENT:  Pt admitted with shock, severe metabolic acidosis, acute on chronic hypoxemic respiratory failure and AMS. PMH significant for PAH, PAD, COPD, hypothyroidism, chronic hypoxemic respiratory failure and PE.  7/21: admitted w/shock, severe metabolic acidosis; intubated ; CT abdomen- no acute findings in abdomen/pelvis 7/23: initiation of CRRT  Patient is currently intubated on ventilator support MV: 10.2 L/min Temp (24hrs), Avg:98.8 F (37.1 C), Min:98.4 F (36.9 C), Max:100 F (37.8 C) MAP (a-line): 72->25mmHg MAP goal >65  Patient is critically ill in setting of shock, severe metabolic acidosis with anion gap, lactic acidosis, acute on chronic hypoxemic respiratory failure, AKI, hyperkalemia, elevated liver enzymes, and hyponatremia.   Per discussion in interdisciplinary rounds, Nephrology monitoring renal labs and plan to initiate CRRT today.   Checked in with patient at bedside. No family/visitors present at time of visit.  Unable to obtain nutrition related history at this time.   Weight history is limited within the last year.  Current weight is likely elevated d/t presence of edema.  + moderate pitting RUE, RLE; non-pitting LUE, mild pitting LLE   Drains/lines: UOP: 14ml x24 hours OGT (tip at distal stomach)- ~ 50ml dark  output in canister CVC LIJ triple lumen L femoral a line  Medications: Colace BID, solu-cortef , miralax  daily Drips: Abx x2 Levo @ 6 mcg/min (weaned down) Vitamin K  (once) Sodium bicarbonate  Vaso @ 0.04 units/min (titrated up)  Labs:  Sodium 127 Potassium 5.3 BUN 58 Cr 4.24 Corrected calcium  8.18 Ionized calcium  0.91 Albumin  1.9 AST 2143 ALT 1965 GFR 10 CBG's 142-205 x24 hours  NUTRITION - FOCUSED PHYSICAL EXAM: Flowsheet Row Most Recent Value  Orbital Region Unable to assess  Upper Arm Region Unable to assess  [moderate pitting BLE RUE>LUE]  Thoracic and Lumbar Region No depletion  Buccal Region Unable to assess  [vent]  Temple Region Moderate depletion  Clavicle Bone Region No depletion  Clavicle and Acromion Bone Region No depletion  Scapular Bone Region Unable to assess  Dorsal Hand Unable to assess  [edema]  Patellar Region No depletion  Anterior Thigh Region No depletion  Posterior Calf Region No depletion  Edema (RD Assessment) Moderate  [BUE/BLE, R>L]  Hair Reviewed  Eyes Unable to assess  Mouth Unable to assess  Skin Other (Comment)  [slight jaundice]  Nails Other (Comment)  [cyanotic bilaterally- hypoperfusion]    Diet Order:   Diet Order             Diet NPO time specified  Diet effective now                   EDUCATION NEEDS:  No education needs have been identified at this time  Skin:  Skin Assessment: Reviewed RN Assessment  Last BM:  7/22 type  Height:  Ht Readings from Last 1 Encounters:  06/22/24 5' 6 (1.676 m)    Weight:  Wt Readings from Last 1 Encounters:  06/23/24 93.9 kg    Ideal Body Weight:  59.1 kg  BMI:  Body mass index is 33.41 kg/m.  Estimated Nutritional Needs:   Kcal:  1500-1700  Protein:  95-110g  Fluid:  1L +UOP  Royce Maris, RDN, LDN Clinical Nutrition See AMiON for contact information.

## 2024-06-24 DIAGNOSIS — J9622 Acute and chronic respiratory failure with hypercapnia: Secondary | ICD-10-CM

## 2024-06-24 DIAGNOSIS — Z515 Encounter for palliative care: Secondary | ICD-10-CM

## 2024-06-24 DIAGNOSIS — Z66 Do not resuscitate: Secondary | ICD-10-CM

## 2024-06-24 DIAGNOSIS — R6521 Severe sepsis with septic shock: Secondary | ICD-10-CM | POA: Diagnosis not present

## 2024-06-24 DIAGNOSIS — Z7189 Other specified counseling: Secondary | ICD-10-CM

## 2024-06-24 DIAGNOSIS — A419 Sepsis, unspecified organism: Secondary | ICD-10-CM | POA: Diagnosis not present

## 2024-06-24 LAB — POCT I-STAT 7, (LYTES, BLD GAS, ICA,H+H)
Acid-base deficit: 8 mmol/L — ABNORMAL HIGH (ref 0.0–2.0)
Acid-base deficit: 10 mmol/L — ABNORMAL HIGH (ref 0.0–2.0)
Bicarbonate: 19.2 mmol/L — ABNORMAL LOW (ref 20.0–28.0)
Bicarbonate: 16.7 mmol/L — ABNORMAL LOW (ref 20.0–28.0)
Calcium, Ion: 0.83 mmol/L — CL (ref 1.15–1.40)
Calcium, Ion: 0.83 mmol/L — CL (ref 1.15–1.40)
HCT: 26 % — ABNORMAL LOW (ref 36.0–46.0)
HCT: 28 % — ABNORMAL LOW (ref 36.0–46.0)
Hemoglobin: 8.8 g/dL — ABNORMAL LOW (ref 12.0–15.0)
Hemoglobin: 9.5 g/dL — ABNORMAL LOW (ref 12.0–15.0)
O2 Saturation: 96 %
O2 Saturation: 96 %
Patient temperature: 36.9
Patient temperature: 36.5
Potassium: 5.5 mmol/L — ABNORMAL HIGH (ref 3.5–5.1)
Potassium: 5.9 mmol/L — ABNORMAL HIGH (ref 3.5–5.1)
Sodium: 128 mmol/L — ABNORMAL LOW (ref 135–145)
Sodium: 125 mmol/L — ABNORMAL LOW (ref 135–145)
TCO2: 21 mmol/L — ABNORMAL LOW (ref 22–32)
TCO2: 18 mmol/L — ABNORMAL LOW (ref 22–32)
pCO2 arterial: 49.3 mmHg — ABNORMAL HIGH (ref 32–48)
pCO2 arterial: 38.1 mmHg (ref 32–48)
pH, Arterial: 7.199 — CL (ref 7.35–7.45)
pH, Arterial: 7.247 — ABNORMAL LOW (ref 7.35–7.45)
pO2, Arterial: 98 mmHg (ref 83–108)
pO2, Arterial: 93 mmHg (ref 83–108)

## 2024-06-24 LAB — CBC WITH DIFFERENTIAL/PLATELET
Abs Immature Granulocytes: 0 K/uL (ref 0.00–0.07)
Band Neutrophils: 29 %
Basophils Absolute: 0 K/uL (ref 0.0–0.1)
Basophils Relative: 0 %
Eosinophils Absolute: 0.1 K/uL (ref 0.0–0.5)
Eosinophils Relative: 1 %
HCT: 29.9 % — ABNORMAL LOW (ref 36.0–46.0)
Hemoglobin: 9.5 g/dL — ABNORMAL LOW (ref 12.0–15.0)
Lymphocytes Relative: 5 %
Lymphs Abs: 0.7 K/uL (ref 0.7–4.0)
MCH: 29.1 pg (ref 26.0–34.0)
MCHC: 31.8 g/dL (ref 30.0–36.0)
MCV: 91.4 fL (ref 80.0–100.0)
Monocytes Absolute: 0.6 K/uL (ref 0.1–1.0)
Monocytes Relative: 4 %
Neutro Abs: 12.7 K/uL — ABNORMAL HIGH (ref 1.7–7.7)
Neutrophils Relative %: 61 %
Platelets: 28 K/uL — CL (ref 150–400)
RBC: 3.27 MIL/uL — ABNORMAL LOW (ref 3.87–5.11)
RDW: 15.7 % — ABNORMAL HIGH (ref 11.5–15.5)
Smear Review: DECREASED
WBC Morphology: INCREASED
WBC: 14.1 K/uL — ABNORMAL HIGH (ref 4.0–10.5)
nRBC: 0.6 % — ABNORMAL HIGH (ref 0.0–0.2)

## 2024-06-24 LAB — PROTIME-INR
INR: 3.5 — ABNORMAL HIGH (ref 0.8–1.2)
Prothrombin Time: 36.9 s — ABNORMAL HIGH (ref 11.4–15.2)

## 2024-06-24 LAB — GLUCOSE, CAPILLARY
Glucose-Capillary: 47 mg/dL — ABNORMAL LOW (ref 70–99)
Glucose-Capillary: 204 mg/dL — ABNORMAL HIGH (ref 70–99)
Glucose-Capillary: 142 mg/dL — ABNORMAL HIGH (ref 70–99)
Glucose-Capillary: 41 mg/dL — CL (ref 70–99)
Glucose-Capillary: 128 mg/dL — ABNORMAL HIGH (ref 70–99)

## 2024-06-24 LAB — COMPREHENSIVE METABOLIC PANEL WITH GFR
ALT: 1761 U/L — ABNORMAL HIGH (ref 0–44)
AST: 1674 U/L — ABNORMAL HIGH (ref 15–41)
Albumin: 1.7 g/dL — ABNORMAL LOW (ref 3.5–5.0)
Alkaline Phosphatase: 81 U/L (ref 38–126)
Anion gap: 29 — ABNORMAL HIGH (ref 5–15)
BUN: 37 mg/dL — ABNORMAL HIGH (ref 8–23)
CO2: 29 mmol/L (ref 22–32)
Calcium: 5.7 mg/dL — CL (ref 8.9–10.3)
Chloride: 83 mmol/L — ABNORMAL LOW (ref 98–111)
Creatinine, Ser: 2.91 mg/dL — ABNORMAL HIGH (ref 0.44–1.00)
GFR, Estimated: 16 mL/min — ABNORMAL LOW (ref >60)
Glucose, Bld: 99 mg/dL (ref 70–99)
Potassium: 6.1 mmol/L — ABNORMAL HIGH (ref 3.5–5.1)
Sodium: 141 mmol/L (ref 135–145)
Total Bilirubin: 4.4 mg/dL — ABNORMAL HIGH (ref 0.0–1.2)
Total Protein: 3.1 g/dL — ABNORMAL LOW (ref 6.5–8.1)

## 2024-06-24 LAB — DIC (DISSEMINATED INTRAVASCULAR COAGULATION)PANEL
D-Dimer, Quant: >20 ug{FEU}/mL — ABNORMAL HIGH (ref 0.00–0.50)
Fibrinogen: 340 mg/dL (ref 210–475)
INR: 4.5 (ref 0.8–1.2)
Platelets: 26 K/uL — CL (ref 150–400)
Prothrombin Time: 44.3 s — ABNORMAL HIGH (ref 11.4–15.2)
Smear Review: NONE SEEN
aPTT: 128 s — ABNORMAL HIGH (ref 24–36)

## 2024-06-24 LAB — CULTURE, RESPIRATORY W GRAM STAIN
Culture: NORMAL
Gram Stain: NONE SEEN

## 2024-06-24 LAB — PHOSPHORUS: Phosphorus: 6.9 mg/dL — ABNORMAL HIGH (ref 2.5–4.6)

## 2024-06-24 LAB — MAGNESIUM: Magnesium: 2.4 mg/dL (ref 1.7–2.4)

## 2024-06-24 LAB — APTT: aPTT: 175 s (ref 24–36)

## 2024-06-24 LAB — LACTIC ACID, PLASMA: Lactic Acid, Venous: >9 mmol/L (ref 0.5–1.9)

## 2024-06-24 MED ORDER — SODIUM CHLORIDE 0.9 % IV SOLN: 4.0000 g | Freq: Once | INTRAVENOUS | Status: DC

## 2024-06-24 MED ORDER — ACETAMINOPHEN 650 MG RE SUPP: 650.0000 mg | Freq: Four times a day (QID) | RECTAL | Status: DC | PRN

## 2024-06-24 MED ORDER — SODIUM CHLORIDE 0.9 % IV SOLN: INTRAVENOUS | Status: DC

## 2024-06-24 MED ORDER — PROPOFOL 1000 MG/100ML IV EMUL
0.0000 ug/kg/min | INTRAVENOUS | Status: DC
  Administered 2024-06-24: 10 ug/kg/min via INTRAVENOUS
  Filled 2024-06-24: qty 100

## 2024-06-24 MED ORDER — ALBUMIN HUMAN 25 % IV SOLN: 50.0000 g | Freq: Once | INTRAVENOUS | Status: AC

## 2024-06-24 MED ORDER — VITAMIN K1 10 MG/ML IJ SOLN
10.0000 mg | Freq: Once | INTRAVENOUS | Status: AC
  Administered 2024-06-24: 10 mg via INTRAVENOUS
  Filled 2024-06-24: qty 1

## 2024-06-24 MED ORDER — FENTANYL CITRATE PF 50 MCG/ML IJ SOSY: 25.0000 ug | PREFILLED_SYRINGE | INTRAMUSCULAR | Status: DC | PRN

## 2024-06-24 MED ORDER — SODIUM BICARBONATE 8.4 % IV SOLN
INTRAVENOUS | Status: DC
  Filled 2024-06-24: qty 150

## 2024-06-24 MED ORDER — MIDAZOLAM HCL 2 MG/2ML IJ SOLN: 2.0000 mg | INTRAMUSCULAR | Status: DC | PRN

## 2024-06-24 MED ORDER — AMIODARONE LOAD VIA INFUSION
150.0000 mg | Freq: Once | INTRAVENOUS | Status: AC
  Administered 2024-06-24: 150 mg via INTRAVENOUS
  Filled 2024-06-24: qty 83.34

## 2024-06-24 MED ORDER — GLYCOPYRROLATE 0.2 MG/ML IJ SOLN: 0.2000 mg | INTRAMUSCULAR | Status: DC | PRN

## 2024-06-24 MED ORDER — ATROPINE SULFATE 1 MG/10ML IJ SOSY
PREFILLED_SYRINGE | INTRAMUSCULAR | Status: AC
  Filled 2024-06-24: qty 10

## 2024-06-24 MED ORDER — ALBUMIN HUMAN 25 % IV SOLN
INTRAVENOUS | Status: AC
  Administered 2024-06-24: 50 g via INTRAVENOUS
  Filled 2024-06-24: qty 50

## 2024-06-24 MED ORDER — VITAMIN K1 10 MG/ML IJ SOLN
1.0000 mg | Freq: Once | INTRAVENOUS | Status: DC
  Filled 2024-06-24: qty 0.1

## 2024-06-24 MED ORDER — GLYCOPYRROLATE 1 MG PO TABS: 1.0000 mg | ORAL_TABLET | ORAL | Status: DC | PRN

## 2024-06-24 MED ORDER — SODIUM BICARBONATE 8.4 % IV SOLN
100.0000 meq | Freq: Once | INTRAVENOUS | Status: AC
  Administered 2024-06-24: 100 meq via INTRAVENOUS

## 2024-06-24 MED ORDER — SODIUM BICARBONATE 8.4 % IV SOLN
INTRAVENOUS | Status: AC
  Filled 2024-06-24: qty 100

## 2024-06-24 MED ORDER — METHYLENE BLUE (ANTIDOTE) 1 % IV SOLN
100.0000 mg | Freq: Once | INTRAVENOUS | Status: AC
  Administered 2024-06-24: 100 mg via INTRAVENOUS
  Filled 2024-06-24: qty 10

## 2024-06-24 MED ORDER — ACETAMINOPHEN 325 MG PO TABS: 650.0000 mg | ORAL_TABLET | Freq: Four times a day (QID) | ORAL | Status: DC | PRN

## 2024-06-24 MED ORDER — POLYVINYL ALCOHOL 1.4 % OP SOLN: 1.0000 [drp] | Freq: Four times a day (QID) | OPHTHALMIC | Status: DC | PRN

## 2024-06-24 MED ORDER — DEXTROSE 50 % IV SOLN
25.0000 g | INTRAVENOUS | Status: AC
  Administered 2024-06-24: 25 g via INTRAVENOUS

## 2024-06-24 MED ORDER — CALCIUM GLUCONATE-NACL 2-0.675 GM/100ML-% IV SOLN
2.0000 g | INTRAVENOUS | Status: AC
  Administered 2024-06-24 (×2): 2000 mg via INTRAVENOUS
  Filled 2024-06-24 (×2): qty 100

## 2024-06-24 MED ORDER — DEXTROSE 50 % IV SOLN
INTRAVENOUS | Status: AC
  Filled 2024-06-24: qty 50

## 2024-06-24 MED ORDER — MIDAZOLAM HCL 2 MG/2ML IJ SOLN: 1.0000 mg | INTRAMUSCULAR | Status: DC | PRN

## 2024-06-24 MED ORDER — DEXTROSE 50 % IV SOLN
INTRAVENOUS | Status: AC
  Administered 2024-06-24: 50 mL
  Filled 2024-06-24: qty 50

## 2024-06-24 MED ORDER — ALBUMIN HUMAN 25 % IV SOLN
INTRAVENOUS | Status: AC
  Filled 2024-06-24: qty 50

## 2024-06-24 MED ORDER — DEXTROSE 5 % IV SOLN: INTRAVENOUS | Status: DC

## 2024-06-27 LAB — CULTURE, BLOOD (ROUTINE X 2): Culture: NO GROWTH

## 2024-06-28 LAB — CULTURE, BLOOD (ROUTINE X 2): Culture: NO GROWTH

## 2024-07-02 NOTE — Consult Note (Signed)
 Consultation Note Date: 07/18/2024   Patient Name: Miranda Cohen  DOB: 08/30/1946  MRN: [id number]  Age / Sex: 78 y.o., female  PCP: Sherial Bail, MD Referring Physician: Kara Dorn NOVAK, MD  Reason for Consultation: Establishing goals of care  HPI/Patient Profile: 78 y.o. female  with past medical history of tobacco abuse, emphysema, previous PE 2021 on warfarin for 1 year, hypothyroidism, PAH, and PAD admitted on 06/21/2024 with AMS, nausea, vomiting, and diarrhea.  Diagnosed with shock and worsening respiratory failure.  Required intubation and pressors.  CRRT started 7/23. Concern for CAP as well as Tylenol  toxicity.  PMT consulted to discuss goals of care.  Clinical Assessment and Goals of Care: I have reviewed medical records including EPIC notes, labs and imaging, received report from RN, assessed the patient and then spoke with daughter to discuss diagnosis prognosis, GOC, EOL wishes, disposition and options.  Thersia, RN at bedside at the time of my evaluation.  No family at bedside.  Alexis and I reviewed patient's difficult evening with vent dyssynchrony as well as hypotension and difficulties with CRRT running.  On observation patient severely mottled throughout extremities and core.  Extremities cold to touch.  Remains on vasopressin  and high amount of Levophed .  CRRT running.  Call to daughter [name], I introduced Palliative Medicine as specialized medical care for people living with serious illness. It focuses on providing relief from the symptoms and stress of a serious illness. The goal is to improve quality of life for both the patient and the family.  Discussed with [name] the need for further goals of care discussions.  She understands and reports she will be at the hospital later today and we agreed to me.  Prior to meeting received update from CCM that they had discussed care with patient's family and DNR had been agreed  upon though they were continuing all other measures.  CCM requested allowing family time before meeting with them again since significant conversation had just been had.  Upon eventual follow-up at bedside patient had passed away.  Family all at bedside and being supported by nursing staff and chaplain.      Primary Diagnoses: Present on Admission:  Septic shock (HCC)   I have reviewed the medical record, interviewed the patient and family, and examined the patient. The following aspects are pertinent.  Past Medical History:  Diagnosis Date   Acute metabolic encephalopathy    Aortic atherosclerosis (HCC) 08/18/2020   Aortic atherosclerosis (HCC)    Arthritis    Chronic lymphocytic thyroiditis 08/18/2020   Coronary artery calcification    Coronary atherosclerosis    Dyspnea    Emphysema lung (HCC) 08/18/2020   History of pulmonary embolism    Hypothyroidism    Myxedema coma (HCC)    PAD (peripheral artery disease) (HCC)    Pericardial effusion    Pulmonary arterial hypertension (HCC)    Pulmonary embolism (HCC) 08/2020   Sepsis (HCC)    Wears dentures    full upper and lower   Social History   Socioeconomic History   Marital status: Divorced    Spouse name: Not on file   Number of children: Not on file   Years of education: Not on file   Highest education level: Not on file  Occupational History   Not on file  Tobacco Use   Smoking status: Every Day    Current packs/day: 0.50    Average packs/day: 1 pack/day for 62.6 years (61.6 ttl pk-yrs)    Types:  Cigarettes    Start date: 1963   Smokeless tobacco: Never   Tobacco comments:    (08/15/22 - has cut back to 1/2 PPD)  Vaping Use   Vaping status: Former  Substance and Sexual Activity   Alcohol  use: Never   Drug use: Never   Sexual activity: Not on file  Other Topics Concern   Not on file  Social History Narrative   Not on file   Social Drivers of Health   Financial Resource Strain: Low Risk  (12/16/2023)    Received from [facility]   Overall Financial Resource Strain (CARDIA)    Difficulty of Paying Living Expenses: Not hard at all  Food Insecurity: Patient Unable To Answer (06/23/2024)   Hunger Vital Sign    Worried About Running Out of Food in the Last Year: Patient unable to answer    Ran Out of Food in the Last Year: Patient unable to answer  Transportation Needs: Patient Unable To Answer (06/23/2024)   PRAPARE - Transportation    Lack of Transportation (Medical): Patient unable to answer    Lack of Transportation (Non-Medical): Patient unable to answer  Physical Activity: Not on file  Stress: Not on file  Social Connections: Patient Unable To Answer (06/23/2024)   Social Connection and Isolation Panel    Frequency of Communication with Friends and Family: Patient unable to answer    Frequency of Social Gatherings with Friends and Family: Patient unable to answer    Attends Religious Services: Patient unable to answer    Active Member of Clubs or Organizations: Patient unable to answer    Attends Banker Meetings: Patient unable to answer    Marital Status: Patient unable to answer   No family history on file. Scheduled Meds:  arformoterol   15 mcg Nebulization Q12H   budesonide  (PULMICORT ) nebulizer solution  0.5 mg Nebulization BID   Chlorhexidine  Gluconate Cloth  6 each Topical Daily   docusate  100 mg Per Tube BID   levothyroxine   112 mcg Per Tube QAC breakfast   mouth rinse  15 mL Mouth Rinse Q2H   pantoprazole  (PROTONIX ) IV  40 mg Intravenous Q12H   polyethylene glycol  17 g Per Tube Daily   sodium chloride  flush  10-40 mL Intracatheter Q12H   sodium chloride  flush  3 mL Intravenous Q12H   sodium chloride  HYPERTONIC  4 mL Nebulization Q6H   thiamine   100 mg Per Tube Daily   Continuous Infusions:  amiodarone  30 mg/hr (07-02-24 1100)   azithromycin  Stopped (06/23/24 1817)   dexmedetomidine  (PRECEDEX ) IV infusion Stopped (02-Jul-2024 0209)    epinephrine  Stopped (06/22/24 0837)   feeding supplement (VITAL 1.5 CAL) 20 mL/hr at 07/02/2024 1100   fentaNYL  infusion INTRAVENOUS 50 mcg/hr (Oct 29, 2024 1100)   norepinephrine  (LEVOPHED ) Adult infusion 36 mcg/min (2024/07/02 1143)   piperacillin -tazobactam (ZOSYN )  IV Stopped (07/02/2024 0923)   prismasol  BGK 4/2.5 600 mL/hr at 02-Jul-2024 0827   prismasol  BGK 4/2.5 1,000 mL/hr at Jul 02, 2024 9073   sodium bicarbonate  150 mEq in dextrose  5 % 1,150 mL infusion 100 mL/hr at 2024/07/02 1100   sodium bicarbonate  150 mEq in sterile water  1,150 mL infusion 250 mL/hr at 02-Jul-2024 1016   vasopressin  0.04 Units/min (07-02-24 1100)   PRN Meds:.fentaNYL , heparin , heparin , ipratropium-albuterol , midazolam , mouth rinse, sodium chloride  flush No Known Allergies  Review of Systems  Unable to perform ROS: Intubated    Physical Exam Constitutional:      Appearance: She is ill-appearing.  Comments: Sedated on vent  Pulmonary:     Effort: Pulmonary effort is normal.  Skin:    Comments: Extreme mottling Cold extremities  Neurological:     Comments: unresponsive     Vital Signs: BP (!) 140/33   Pulse 94   Temp (!) 97.5 F (36.4 C)   Resp (!) 28   Ht 5' 6 (1.676 m)   Wt 93.7 kg   SpO2 (!) 83%   BMI 33.34 kg/m  Pain Scale: CPOT POSS *See Group Information*: 4-INTERVENTION REQUIRED,Unacceptable,Somnolent, mininal or no response to verbal and physical stimulation Pain Score: 0-No pain   SpO2: SpO2: (!) 83 % O2 Device:SpO2: (!) 83 % O2 Flow Rate: .   IO: Intake/output summary:  Intake/Output Summary (Last 24 hours) at 20-Jul-2024 1207 Last data filed at 07/20/24 1100 Gross per 24 hour  Intake 6519.45 ml  Output 2298.3 ml  Net 4221.15 ml    LBM: Last BM Date : 06/23/24 Baseline Weight: Weight: 88.9 kg Most recent weight: Weight: 93.7 kg      *Please note that this is a verbal dictation therefore any spelling or grammatical errors are due to the [employer] One system  interpretation.   Time Total: 45 minutes Time spent includes: Detailed review of medical records (labs, imaging, vital signs), medically appropriate exam, discussion with treatment team, counseling and educating patient, family and/or staff, documenting clinical information, medication management and coordination of care.    Tobey Jama Barnacle, DNP, AGNP-C Palliative Medicine Team [phone] Pager: [phone]

## 2024-07-02 NOTE — Significant Event (Addendum)
 Daughter would like pt to be DNR - no chest compressions but other pre-arrest interventions still desired

## 2024-07-02 NOTE — Plan of Care (Signed)
  Problem: Clinical Measurements: Goal: Ability to maintain clinical measurements within normal limits will improve Outcome: Progressing   Problem: Clinical Measurements: Goal: Will remain free from infection Outcome: Progressing   Problem: Clinical Measurements: Goal: Diagnostic test results will improve Outcome: Progressing   Problem: Clinical Measurements: Goal: Respiratory complications will improve Outcome: Progressing   Problem: Clinical Measurements: Goal: Cardiovascular complication will be avoided Outcome: Progressing   Problem: Nutrition: Goal: Adequate nutrition will be maintained Outcome: Progressing   Problem: Pain Managment: Goal: General experience of comfort will improve and/or be controlled Outcome: Progressing   Problem: Elimination: Goal: Will not experience complications related to urinary retention Outcome: Progressing   Problem: Elimination: Goal: Will not experience complications related to bowel motility Outcome: Progressing   Problem: Safety: Goal: Ability to remain free from injury will improve Outcome: Progressing   Problem: Skin Integrity: Goal: Risk for impaired skin integrity will decrease Outcome: Progressing   Plan of care, assessment, treatment, monitoring, and intervention (s) ongoing, see MAR see flowsheet

## 2024-07-02 NOTE — Progress Notes (Signed)
 eLink Physician-Brief Progress Note Patient Name: Miranda Cohen DOB: 04/09/1946 MRN: [id number]   Date of Service  July 19, 2024  HPI/Events of Note  INR 4.5, no evidence of active bleeding.  eICU Interventions  Vitamin K  1 mg iv ordered.        Suhaila Troiano U Chanette Demo 07/19/24, 6:25 AM

## 2024-07-02 NOTE — Progress Notes (Addendum)
 Chaplain responded to page request for family support. Upon arrival, I spoke with Basha's son and grandson outside of the room and offered them comfort, reflective listening and words of encouragement. When I entered the room, more family was present and receiving updates from the MD. They expressed no chaplain needs at this time, as another chaplain had visited soon before and offered prayer, and they were all supporting each other. Chaplains remain available.

## 2024-07-02 NOTE — Progress Notes (Signed)
 Date and time results received: 06-26-24 0336 (use smartphrase .now to insert current time)  Test: Plt Critical Value: 28  Name of Provider Notified: Elink RN, Rosina  Orders Received? Or Actions Taken?: Following careplan.

## 2024-07-02 NOTE — Significant Event (Signed)
 HR in 30s-40s. Family (daughter and two sons) do not want intervention, planning to pursue comfort care. Not quite ready for extubation but will d/c CRRT.

## 2024-07-02 NOTE — Significant Event (Addendum)
 Time of death 15 Family at bedside

## 2024-07-02 NOTE — Progress Notes (Signed)
 Earlier patient having vent dyssynchrony issues and requiring more sedation. Was added propofol . Then CRRT constantly alarming and patient becoming increasingly more hypotensive. Levo at 40 and vaso 0.04 with maps around 50. CRRT unable to keep even and currently positive requiring manual pressure against catheter to be able to pull. Sedation paused due to hypotension. Patient also having afib rvr 140-160s already on amio drip. PCCM called to troubleshoot. Levo ceiling raised to 60. Given albumin . Given 2 amps bicarb and drip increased to 150. Given amio bolus 150 with improvement in HR. Some peaked t waves appreciated given more calcium . BP much improved after adjustments and levo coming down. CRRT flowing better. Sending cmp, mag, cbc.   CCT: 35 minutes  JD Emilio RIGGERS [facility] 2024/06/27, 2:39 AM  Please see [email or web address] for pager details.  From 7A-7P if no response, please call [phone]. After hours, please call ELink [phone].

## 2024-07-02 NOTE — Progress Notes (Signed)
   06-28-24 1259  Spiritual Encounters  Type of Visit Initial  Care provided to: Family  Referral source Clinical staff  Reason for visit End-of-life  OnCall Visit No  Spiritual Framework  Presenting Themes Significant life change;Impactful experiences and emotions  Community/Connection Family  Patient Stress Factors Health changes  Family Stress Factors Health changes;Major life changes  Interventions  Spiritual Care Interventions Made Established relationship of care and support;Compassionate presence;Normalization of emotions;Prayer  Intervention Outcomes  Outcomes Awareness of health;Awareness of support;Awareness around self/spiritual resourses   Chaplain was paged to Pt's room  as the Pt is transition to palliative car. Present in the room were the Pt's daughter and sons. At the daughter's request, chaplain offered a word of prayer.  Chaplain remained present as the medical team communicated updates regarding the Pt's conditions and informed the family that palliative care team would follow up shortly.  Chaplain provided emotional and  spiritual support, then gave the family private time to discuss medical matters.

## 2024-07-02 NOTE — Progress Notes (Signed)
 eLink Physician-Brief Progress Note Patient Name: Miranda Cohen DOB: 03-Sep-1946 MRN: [id number]   Date of Service  07/14/2024  HPI/Events of Note  Patient with sub-optimal sedation on the ventilator, she is dyssynchronous.  eICU Interventions  Propofol  gtt added to optimize sedation on the ventilator.        Goerge Mohr U Miranda Cohen July 14, 2024, 1:35 AM

## 2024-07-02 NOTE — Death Summary Note (Signed)
 DEATH SUMMARY   Patient Details  Name: Miranda Cohen MRN: [id number] DOB: 20-Dec-1945  Admission/Discharge Information   Admit Date:  07-11-2024  Date of Death: Date of Death: 2024-07-14  Time of Death: Time of Death: 07-20-1334  Length of Stay: 3  Referring Physician: Sherial Bail, MD   Reason(s) for Hospitalization  Septic shock due to pneumonia  Diagnoses  Preliminary cause of death:  Septic Shock due to pneumonia  Secondary Diagnoses (including complications and co-morbidities):  Principal Problem:   Septic shock (HCC) Acute on Chronic Hypoxemic Respiratory Failure COPD Peripheral Arterial Disease Acute Metabolic Encephalopathy Acute Kidney Injury on CKDIIIb Lactic Acidosis Shock Liver Thrombocytopenia Coagulopathy Atrial Fibrillation with RVR  Brief Hospital Course (including significant findings, care, treatment, and services provided and events leading to death)  Miranda Cohen is a 78 y.o. year old female with tobacco abuse, emphysema, previous PE 07-20-20 on warfarin for 1 year, hypothyroidism, PAH, PAD presents to [facility] on 2024-07-11 w/ AMS.   Patient recently seen by ED on 7/11 for right ankle pain/swelling. Dvt us  negative. Xray showing small bone fragment possible avulsion fracture. Sent home w/ pain meds and ankle wrapped. F/u w/ ortho on 7/14. Given prednisone  taper for 10 days.    On 07-11-24 patient having acute N/V/Diarrhea and abd pain in am. Per patient's daughter patient took her steroid taper much quicker than was prescribed. EMS called and cbg 30 given dextrose . Brought to [facility] ED. Placed on 4L [place] sats 90%. Patient tachypneic 20-30s w/ congested cough. Patient bp initially stable but became hypotensive. Temp 27F and wbc 21. Cultures obtained and given iv fluids. CXR w/ bibasilar disease and left pleural effusion. Started on rocephin /azithro. Covid/flu negative. LA 6 then 7.1. UA w/ rare bacteria. Despite iv fluids patient remained hypotensive requiring levo. Patient with  increasing sob and having becoming more altered/confused. Placed on bipap but unable to tolerate. Patient intubated. PCCM consulted for icu admission.  She was started on broad spectrum antibiotics. She developed atrial fibrillation with RVR. CT abdomen/pelvis on admission was unremarkable for acute pathology. Her lactic acid initially improved but then continued to worsen along with increasing vasopressor requirements. Her serum Cr continued to worsen with no urine output. Patient was started on CRRT on 7/23 with no improvement overnight. Her vasopressor requirements continue to rise. Meetings with family resulted in DNR code status change. Patient passed away at 1332 on 07-14-2024.   Pertinent Labs and Studies  Significant Diagnostic Studies VAS US  UPPER EXTREMITY ARTERIAL DUPLEX Result Date: 07-14-2024  UPPER EXTREMITY DUPLEX STUDY Patient Name:  Miranda Cohen  Date of Exam:   06/23/2024 Medical Rec #: [id number]        Accession #:    [id number] Date of Birth: Aug 30, 1946         Patient Gender: F Patient Age:   6 years Exam Location:  [facility] [place] Procedure:      VAS US  UPPER EXTREMITY ARTERIAL DUPLEX Referring Phys: DORN CHILL --------------------------------------------------------------------------------  Indications: Cold extremity, PAD.  Performing Technologist: Elmarie Lindau, RVT  Examination Guidelines: A complete evaluation includes B-mode imaging, spectral Doppler, color Doppler, and power Doppler as needed of all accessible portions of each vessel. Bilateral testing is considered an integral part of a complete examination. Limited examinations for reoccurring indications may be performed as noted.  Right Doppler Findings: +---------------+----------+---------+--------+--------+ Site           PSV (cm/s)Waveform StenosisComments +---------------+----------+---------+--------+--------+ Subclavian Prox164       biphasic                   +---------------+----------+---------+--------+--------+  Subclavian Mid 58        biphasic                  +---------------+----------+---------+--------+--------+ Subclavian Dist47        biphasic                  +---------------+----------+---------+--------+--------+ Axillary       62        biphasic                  +---------------+----------+---------+--------+--------+ Brachial Prox  46        triphasic                 +---------------+----------+---------+--------+--------+ Brachial Mid   109       triphasic                 +---------------+----------+---------+--------+--------+ Brachial Dist                             bandage  +---------------+----------+---------+--------+--------+ Radial Prox                               bandage  +---------------+----------+---------+--------+--------+ Radial Mid                                IV       +---------------+----------+---------+--------+--------+ Radial Dist    62        biphasic                  +---------------+----------+---------+--------+--------+ Ulnar Prox                                bandage  +---------------+----------+---------+--------+--------+ Ulnar Mid                                 IV       +---------------+----------+---------+--------+--------+ Ulnar Dist     39        biphasic                  +---------------+----------+---------+--------+--------+ No hemodynamically significant stenosis or occlusion is seen in the right upper extremity arterial system.    Summary:  Right: No obstruction visualized in the right upper extremity. *See table(s) above for measurements and observations. Electronically signed by Lonni Gaskins MD on 07/20/24 at 8:29:44 AM.    Final    DG CHEST PORT 1 VIEW Result Date: 06/23/2024 CLINICAL DATA:  Central line placement EXAM: PORTABLE CHEST 1 VIEW COMPARISON:  Yesterday FINDINGS: Endotracheal tube terminates 3.5 cm above carina.  Nasogastric tube extends beyond the inferior aspect of the film. Left internal jugular line tip over mid to low SVC, poorly visualized due to overlying nasogastric tube. There is a double-lumen right internal jugular line which also terminates over the low SVC. Moderate cardiomegaly. Small to moderate left pleural effusion is unchanged. No pneumothorax. Mild interstitial edema is decreased. Left greater than right base airspace disease is not significantly changed. IMPRESSION: Right-sided internal jugular line terminating over the mid to low SVC, without pneumothorax. Cardiomegaly with improved congestive heart failure. No change in left pleural effusion and bibasilar airspace disease. Electronically Signed   By: Rockey Kilts M.D.   On: 06/23/2024 16:17  ECHOCARDIOGRAM COMPLETE Result Date: 06/22/2024    ECHOCARDIOGRAM REPORT   Patient Name:   Miranda Cohen Date of Exam: 06/22/2024 Medical Rec #:  [id number]       Height:       65.0 in Accession #:    [id number]      Weight:       196.0 lb Date of Birth:  11/22/1946        BSA:          1.961 m Patient Age:    77 years        BP:           95/22 mmHg Patient Gender: F               HR:           87 bpm. Exam Location:  Inpatient Procedure: 2D Echo, Color Doppler and Cardiac Doppler (Both Spectral and Color            Flow Doppler were utilized during procedure). Indications:    Shock  History:        Patient has prior history of Echocardiogram examinations, most                 recent 08/06/2020. Septic shock.  Sonographer:    Benard Stallion Referring Phys: [id number] NORLEEN D PAYNE IMPRESSIONS  1. Left ventricular ejection fraction, by estimation, is 40 to 45%. The left ventricle has mildly decreased function. The left ventricle demonstrates global hypokinesis. There is moderate concentric left ventricular hypertrophy. Left ventricular diastolic parameters are indeterminate.  2. Right ventricular systolic function is normal. The right ventricular size is normal.  There is normal pulmonary artery systolic pressure.  3. Moderate pericardial effusion. The pericardial effusion is posterior to the left ventricle and circumferential.  4. The mitral valve is degenerative. Trivial mitral valve regurgitation. Moderate mitral annular calcification.  5. The aortic valve is tricuspid. Aortic valve regurgitation is not visualized. No aortic stenosis is present. Comparison(s): Prior images reviewed side by side. Left ventricular function has worsened. Pericardial effusion has decreased. FINDINGS  Left Ventricle: Left ventricular ejection fraction, by estimation, is 40 to 45%. The left ventricle has mildly decreased function. The left ventricle demonstrates global hypokinesis. The left ventricular internal cavity size was normal in size. There is  moderate concentric left ventricular hypertrophy. Left ventricular diastolic parameters are indeterminate. Right Ventricle: The right ventricular size is normal. No increase in right ventricular wall thickness. Right ventricular systolic function is normal. There is normal pulmonary artery systolic pressure. The tricuspid regurgitant velocity is 1.54 m/s, and  with an assumed right atrial pressure of 8 mmHg, the estimated right ventricular systolic pressure is 17.5 mmHg. Left Atrium: Left atrial size was normal in size. Right Atrium: Right atrial size was normal in size. Pericardium: A moderately sized pericardial effusion is present. The pericardial effusion is posterior to the left ventricle and circumferential. Mitral Valve: The mitral valve is degenerative in appearance. Moderate mitral annular calcification. Trivial mitral valve regurgitation. The mean mitral valve gradient is 2.0 mmHg with average heart rate of 87 bpm. Tricuspid Valve: The tricuspid valve is normal in structure. Tricuspid valve regurgitation is not demonstrated. No evidence of tricuspid stenosis. Aortic Valve: The aortic valve is tricuspid. Aortic valve regurgitation is not  visualized. No aortic stenosis is present. Aortic valve mean gradient measures 2.0 mmHg. Aortic valve peak gradient measures 4.0 mmHg. Aortic valve area, by VTI measures 3.41 cm. Pulmonic Valve: The pulmonic valve was  not well visualized. Pulmonic valve regurgitation is mild. No evidence of pulmonic stenosis. Aorta: The aortic root is normal in size and structure. IAS/Shunts: The atrial septum is grossly normal.  LEFT VENTRICLE PLAX 2D LVIDd:         3.40 cm     Diastology LVIDs:         2.90 cm     LV e' medial:    5.00 cm/s LV PW:         1.40 cm     LV E/e' medial:  17.6 LV IVS:        1.40 cm     LV e' lateral:   5.44 cm/s LVOT diam:     2.10 cm     LV E/e' lateral: 16.2 LV SV:         64 LV SV Index:   33 LVOT Area:     3.46 cm  LV Volumes (MOD) LV vol d, MOD A2C: 47.0 ml LV vol d, MOD A4C: 31.1 ml LV vol s, MOD A2C: 25.4 ml LV vol s, MOD A4C: 19.6 ml LV SV MOD A2C:     21.6 ml LV SV MOD A4C:     31.1 ml LV SV MOD BP:      16.7 ml RIGHT VENTRICLE RV Basal diam:  3.30 cm RV Mid diam:    2.80 cm RV S prime:     15.30 cm/s TAPSE (M-mode): 1.4 cm LEFT ATRIUM             Index        RIGHT ATRIUM           Index LA diam:        3.90 cm 1.99 cm/m   RA Area:     15.40 cm LA Vol (A2C):   47.8 ml 24.37 ml/m  RA Volume:   32.70 ml  16.67 ml/m LA Vol (A4C):   30.1 ml 15.35 ml/m LA Biplane Vol: 38.9 ml 19.84 ml/m  AORTIC VALVE AV Area (Vmax):    3.29 cm AV Area (Vmean):   3.19 cm AV Area (VTI):     3.41 cm AV Vmax:           100.00 cm/s AV Vmean:          70.600 cm/s AV VTI:            0.188 m AV Peak Grad:      4.0 mmHg AV Mean Grad:      2.0 mmHg LVOT Vmax:         94.90 cm/s LVOT Vmean:        65.000 cm/s LVOT VTI:          0.185 m LVOT/AV VTI ratio: 0.98  AORTA Ao Root diam: 3.00 cm MITRAL VALVE                TRICUSPID VALVE MV Area (PHT): 3.77 cm     TR Peak grad:   9.5 mmHg MV Mean grad:  2.0 mmHg     TR Vmax:        154.00 cm/s MV Decel Time: 201 msec MV E velocity: 88.00 cm/s   SHUNTS MV A velocity:  129.00 cm/s  Systemic VTI:  0.18 m MV E/A ratio:  0.68         Systemic Diam: 2.10 cm Stanly Leavens MD Electronically signed by Stanly Leavens MD Signature Date/Time: 06/22/2024/1:31:09 PM    Final    DG CHEST PORT  1 VIEW Result Date: 06/22/2024 CLINICAL DATA:  Acute respiratory failure. EXAM: PORTABLE CHEST 1 VIEW COMPARISON:  Radiographs 06/21/2024 and 08/06/2020.  CT 06/21/2024. FINDINGS: 0413 hours. No significant change in position of the endotracheal tube, left IJ central venous catheter and enteric tube (tip of the latter below the diaphragm, not visualized). Tip of the central line overlies the azygous vein interval partial re-expansion of the left lung with decreased mediastinal shift to the left. There is residual left lower lobe airspace disease and a possible small left pleural effusion. The right lung appears clear. No evidence of pneumothorax. The heart size and mediastinal contours stable. IMPRESSION: 1. Interval partial re-expansion of the left lung with decreased mediastinal shift to the left. Residual left lower lobe airspace disease and possible small left pleural effusion. 2. Stable support system. Electronically Signed   By: Elsie Perone M.D.   On: 06/22/2024 09:30   DG CHEST PORT 1 VIEW Result Date: 06/22/2024 EXAM: 1 VIEW XRAY OF THE CHEST 06/22/2024 12:03:00 AM COMPARISON: CT chest dated 06/21/2024. CLINICAL HISTORY: Encounter for central line placement; encounter for orogastric (OG) tube placement; encounter for central line and intubation. FINDINGS: LUNGS AND PLEURA: Near complete opacification of the left hemithorax, likely reflecting atelectasis. No pneumothorax. HEART AND MEDIASTINUM: No acute abnormality of the cardiac and mediastinal silhouettes. BONES AND SOFT TISSUES: No acute osseous abnormality. LINES AND TUBES: Endotracheal tube terminates 3.5 cm above the carina, in satisfactory position. Enteric tube courses into the stomach. Left IJ venous catheter is likely  in the distal SVC, but does not cross midline, probably due to leftward cardiomediastinal shift. IMPRESSION: 1. Near complete opacification of the left hemithorax, likely reflecting atelectasis. 2. Endotracheal tube in satisfactory position. 3. Left IJ venous catheter does not cross midline, but is likely in the distal SVC. Electronically signed by: Pinkie Pebbles MD 06/22/2024 12:16 AM EDT RP Workstation: [id number]   DG Abd 1 View Result Date: 06/22/2024 CLINICAL DATA:  OG tube placement EXAM: ABDOMEN - 1 VIEW COMPARISON:  CT 06/21/2024 FINDINGS: Enteric tube tip at the distal stomach. Airspace disease at the left base. IMPRESSION: Enteric tube tip at the distal stomach. Electronically Signed   By: Luke Bun M.D.   On: 06/22/2024 00:11   CT CHEST ABDOMEN PELVIS WO CONTRAST Result Date: 06/21/2024 EXAM: CT CHEST, ABDOMEN AND PELVIS WITHOUT CONTRAST 06/21/2024 10:32:48 PM TECHNIQUE: CT of the chest, abdomen and pelvis was performed without the administration of intravenous contrast. Multiplanar reformatted images are provided for review. Automated exposure control, iterative reconstruction, and/or weight based adjustment of the mA/kV was utilized to reduce the radiation dose to as low as reasonably achievable. COMPARISON: None available. CLINICAL HISTORY: ? pna sepsis. Chief complaints; Hypoglycemia; CT CHEST ABDOMEN PELVIS WO CONTRAST; ? pna sepsis; CT HEAD WO CONTRAST ( ); Mental status change, persistent or worsening FINDINGS: CHEST: MEDIASTINUM: Small pericardial effusion with leftward cardiomediastinal shift. Endotracheal tube preferentially intubates the right mainstem bronchus, 1 cm below the carina. Withdrawal 5 cm is suggested. THORACIC LYMPH NODES: No mediastinal, hilar or axillary lymphadenopathy. LUNGS AND PLEURA: Patchy left upper lobe opacity with volume loss, suggesting atelectasis. Additional left lower lobe opacity, likely atelectasis. Small right pleural effusion with right basilar  atelectasis. Mild centrilobular and paraseptal emphysematous changes, upper lung predominant. No pneumothorax. ABDOMEN AND PELVIS: LIVER: The liver is unremarkable. GALLBLADDER AND BILE DUCTS: Gallbladder is unremarkable. No biliary ductal dilatation. SPLEEN: No acute abnormality. PANCREAS: No acute abnormality. ADRENAL GLANDS: No acute abnormality. KIDNEYS, URETERS AND BLADDER: No stones in  the kidneys or ureters. No hydronephrosis. No perinephric or periureteral stranding. Urinary bladder is decompressed by indwelling Foley catheter. GI AND BOWEL: Stomach demonstrates no acute abnormality. There is no bowel obstruction. Sigmoid diverticulosis, without evidence of diverticulitis. Appendix is not discretely visualized. REPRODUCTIVE ORGANS: Uterus within normal limits. PERITONEUM AND RETROPERITONEUM: Trace pelvic ascites. No free air. VASCULATURE: Thoracic aortic atherosclerosis. Mild coronary atherosclerosis of the LAD and left circumflex. Atherosclerotic calcifications of the abdominal aorta and branch vessels. ABDOMINAL AND PELVIS LYMPH NODES: No lymphadenopathy. BONES AND SOFT TISSUES: Mild degenerative changes of the visualized thoracolumbar spine. No focal soft tissue abnormality. IMPRESSION: 1. Endotracheal tube preferentially intubates the right mainstem bronchus, 1 cm below the carina. Withdrawal 5 cm is suggested. 2. Associated left lung atelectasis with volume loss. 3. Small pericardial effusion. Small right pleural effusion. 4. No acute findings in the abdomen/pelvis. Electronically signed by: Pinkie Pebbles MD 06/21/2024 10:53 PM EDT RP Workstation: [id number]   CT HEAD WO CONTRAST ( ) Result Date: 06/21/2024 EXAM: CT HEAD WITHOUT CONTRAST 06/21/2024 10:32:48 PM TECHNIQUE: CT of the head was performed without the administration of intravenous contrast. Automated exposure control, iterative reconstruction, and/or weight based adjustment of the mA/kV was utilized to reduce the radiation dose to as  low as reasonably achievable. COMPARISON: None available. CLINICAL HISTORY: Mental status change, persistent or worsening. Chief complaints; Hypoglycemia; CT CHEST ABDOMEN PELVIS WO CONTRAST; ? pna sepsis; CT HEAD WO CONTRAST ( ); Mental status change, persistent or worsening. FINDINGS: BRAIN AND VENTRICLES: No acute hemorrhage. Gray-white differentiation is preserved. No hydrocephalus. No extra-axial collection. No mass effect or midline shift. Mild subcortical and periventricular small vessel ischemic changes. ORBITS: No acute abnormality. SINUSES: No acute abnormality. SOFT TISSUES AND SKULL: No acute soft tissue abnormality. No skull fracture. Mild intracranial atherosclerosis. IMPRESSION: 1. No acute intracranial abnormality. 2. Mild small vessel ischemic changes. Electronically signed by: Pinkie Pebbles MD 06/21/2024 10:44 PM EDT RP Workstation: [id number]   DG CHEST PORT 1 VIEW Result Date: 06/21/2024 CLINICAL DATA:  Altered mental status intubated EXAM: PORTABLE CHEST 1 VIEW COMPARISON:  06/21/2024, chest CT 04/26/2022 FINDINGS: Interval intubation, tip of the endotracheal tube is partially obscured by telemetry lead, it appears to be near the carina, just above right mainstem bronchus. Cardiomegaly with vascular congestion and edema. Dense airspace disease is suspected at the left base. Probable pleural effusions. Aortic atherosclerosis. IMPRESSION: 1. Interval intubation, tip of the endotracheal tube is partially obscured by telemetry lead, it appears to be near the carina, just above right mainstem bronchus. Recommend retraction. 2. Cardiomegaly with vascular congestion and edema. Dense airspace disease at the left base, atelectasis versus pneumonia. Probable pleural effusions. Electronically Signed   By: Luke Bun M.D.   On: 06/21/2024 22:25   DG Chest Portable 1 View Result Date: 06/21/2024 CLINICAL DATA:  Question pulmonary edema EXAM: PORTABLE CHEST 1 VIEW COMPARISON:  06/21/2024  FINDINGS: Enlarged cardiac silhouette. There is bibasilar airspace disease. LEFT lung base is poorly visualized. Probable LEFT effusion. No pneumothorax. IMPRESSION: 1. Bibasilar airspace disease and LEFT effusion. 2. Cardiomegaly. Electronically Signed   By: Jackquline Boxer M.D.   On: 06/21/2024 19:56   DG Chest Port 1 View Result Date: 06/21/2024 EXAM: 1 VIEW XRAY OF THE CHEST 06/21/2024 03:39:00 PM COMPARISON: Chest radiograph of 08/10/2020. CLINICAL HISTORY: Questionable sepsis - evaluate for abnormality. Reason for exam: Questionable sepsis; Triage notes: ; Pt bib EMS from home for complaint of AMS and hypoglycemia. Per family patient LKW was around 2200 last night. Pt was complaining of  N/V/D and chills yesterday. Woke up this morning confused. Per EMS pt was taking prednisone  for ankle injury but may ; have taken course too quickly. CBG on EMS arrival was 31. Pt received 25g of D10 PTA. Last CBG for EMS was 81. Pt wears 4L [place] at baseline. FINDINGS: LUNGS AND PLEURA: Small to moderate left pleural effusion. A skin fold over the inferior lateral right hemithorax. Mild pulmonary interstitial thickening/coarsening, nonspecific. Moderate lower lung predominant left-sided airspace disease is increased. Improved right base airspace disease/atelectasis. HEART AND MEDIASTINUM: Moderate cardiomegaly. BONES AND SOFT TISSUES: No acute osseous abnormality. IMPRESSION: 1. Increased small-to-moderate left pleural effusion with worsened adjacent atelectasis or infection. 2. Improved right base aeration, likely atelectasis 3. Moderate cardiomegaly without congestive heart failure. Electronically signed by: Rockey Kilts MD 06/21/2024 03:49 PM EDT RP Workstation: [id number]   DG Ankle Complete Right Result Date: 06/11/2024 CLINICAL DATA:  pain and swelling EXAM: RIGHT ANKLE - COMPLETE 3+ VIEW COMPARISON:  None Available. FINDINGS: Small curvilinear bone fragment subjacent to the medial malleolus. No dislocation. No ankle  mortise widening. The talar dome is intact. There is no evidence of arthropathy or other focal bone abnormality. Moderate soft tissue swelling about the ankle. IMPRESSION: Moderate soft tissue swelling about the ankle. Small curvilinear bone fragment subjacent to the medial malleolus, which may represent a small chip avulsion fracture. No ankle dislocation. Electronically Signed   By: Rogelia Myers M.D.   On: 06/11/2024 17:05   US  Venous Img Lower Unilateral Right (DVT) Result Date: 06/11/2024 CLINICAL DATA:  Right lower extremity pain EXAM: RIGHT LOWER EXTREMITY VENOUS DOPPLER ULTRASOUND TECHNIQUE: Gray-scale sonography with compression, as well as color and duplex ultrasound, were performed to evaluate the deep venous system(s) from the level of the common femoral vein through the popliteal and proximal calf veins. COMPARISON:  None Available. FINDINGS: VENOUS Normal compressibility of the common femoral, superficial femoral, and popliteal veins, as well as the visualized calf veins. Visualized portions of profunda femoral vein and great saphenous vein unremarkable. No filling defects to suggest DVT on grayscale or color Doppler imaging. Doppler waveforms show normal direction of venous flow, normal respiratory plasticity and response to augmentation. Limited views of the contralateral common femoral vein are unremarkable. OTHER None. Limitations: none IMPRESSION: Negative. Electronically Signed   By: Wilkie Lent M.D.   On: 06/11/2024 13:55    Microbiology Recent Results (from the past 240 hours)  Resp panel by RT-PCR (RSV, Flu A&B, Covid) Anterior Nasal Swab     Status: None   Collection Time: 06/21/24  2:47 PM   Specimen: Anterior Nasal Swab  Result Value Ref Range Status   SARS Coronavirus 2 by RT PCR NEGATIVE NEGATIVE Final   Influenza A by PCR NEGATIVE NEGATIVE Final   Influenza B by PCR NEGATIVE NEGATIVE Final    Comment: (NOTE) The Xpert Xpress SARS-CoV-2/FLU/RSV plus assay is  intended as an aid in the diagnosis of influenza from Nasopharyngeal swab specimens and should not be used as a sole basis for treatment. Nasal washings and aspirates are unacceptable for Xpert Xpress SARS-CoV-2/FLU/RSV testing.  Fact Sheet for Patients: BloggerCourse.com  Fact Sheet for Healthcare Providers: SeriousBroker.it  This test is not yet approved or cleared by the [place]  FDA and has been authorized for detection and/or diagnosis of SARS-CoV-2 by FDA under an Emergency Use Authorization (EUA). This EUA will remain in effect (meaning this test can be used) for the duration of the COVID-19 declaration under Section 564(b)(1) of the Act, 21 U.S.C.  section 360bbb-3(b)(1), unless the authorization is terminated or revoked.     Resp Syncytial Virus by PCR NEGATIVE NEGATIVE Final    Comment: (NOTE) Fact Sheet for Patients: BloggerCourse.com  Fact Sheet for Healthcare Providers: SeriousBroker.it  This test is not yet approved or cleared by the [place]  FDA and has been authorized for detection and/or diagnosis of SARS-CoV-2 by FDA under an Emergency Use Authorization (EUA). This EUA will remain in effect (meaning this test can be used) for the duration of the COVID-19 declaration under Section 564(b)(1) of the Act, 21 U.S.C. section 360bbb-3(b)(1), unless the authorization is terminated or revoked.  Performed at [facility], [address] [place], [place] [address]   Respiratory (~20 pathogens) panel by PCR     Status: None   Collection Time: 06/21/24  4:05 PM   Specimen: Nasopharyngeal Swab; Respiratory  Result Value Ref Range Status   Adenovirus NOT DETECTED NOT DETECTED Final   Coronavirus 229E NOT DETECTED NOT DETECTED Final    Comment: (NOTE) The Coronavirus on the Respiratory Panel, DOES NOT test for the novel  Coronavirus (2019 nCoV)     Coronavirus HKU1 NOT DETECTED NOT DETECTED Final   Coronavirus NL63 NOT DETECTED NOT DETECTED Final   Coronavirus OC43 NOT DETECTED NOT DETECTED Final   Metapneumovirus NOT DETECTED NOT DETECTED Final   Rhinovirus / Enterovirus NOT DETECTED NOT DETECTED Final   Influenza A NOT DETECTED NOT DETECTED Final   Influenza B NOT DETECTED NOT DETECTED Final   Parainfluenza Virus 1 NOT DETECTED NOT DETECTED Final   Parainfluenza Virus 2 NOT DETECTED NOT DETECTED Final   Parainfluenza Virus 3 NOT DETECTED NOT DETECTED Final   Parainfluenza Virus 4 NOT DETECTED NOT DETECTED Final   Respiratory Syncytial Virus NOT DETECTED NOT DETECTED Final   Bordetella pertussis NOT DETECTED NOT DETECTED Final   Bordetella Parapertussis NOT DETECTED NOT DETECTED Final   Chlamydophila pneumoniae NOT DETECTED NOT DETECTED Final   Mycoplasma pneumoniae NOT DETECTED NOT DETECTED Final    Comment: Performed at [facility], [address] [place], [place] [address]  Blood Culture (routine x 2)     Status: None (Preliminary result)   Collection Time: 06/21/24  5:45 PM   Specimen: BLOOD RIGHT HAND  Result Value Ref Range Status   Specimen Description BLOOD RIGHT HAND  Final   Special Requests   Final    BOTTLES DRAWN AEROBIC ONLY Blood Culture results may not be optimal due to an inadequate volume of blood received in culture bottles   Culture   Final    NO GROWTH 3 DAYS Performed at [facility], [address] [place], [place] [address]    Report Status PENDING  Incomplete  Urine Culture     Status: None   Collection Time: 06/21/24  7:00 PM   Specimen: Urine, Random  Result Value Ref Range Status   Specimen Description URINE, RANDOM  Final   Special Requests URINE, CATHETERIZED  Final   Culture   Final    NO GROWTH Performed at [facility], [address] [place], [place] [address]    Report Status 06/23/2024 FINAL  Final  MRSA Next Gen by PCR, Nasal     Status: None   Collection Time:  06/21/24 11:05 PM   Specimen: Nasal Mucosa; Nasal Swab  Result Value Ref Range Status   MRSA by PCR Next Gen NOT DETECTED NOT DETECTED Final    Comment: (NOTE) The GeneXpert MRSA  Assay (FDA approved for NASAL specimens only), is one component of a comprehensive MRSA colonization surveillance program. It is not intended to diagnose MRSA infection nor to guide or monitor treatment for MRSA infections. Test performance is not FDA approved in patients less than 3 years old. Performed at [facility], [address] [place], [place] [address]   Culture, Respiratory w Gram Stain     Status: None   Collection Time: 06/21/24 11:20 PM   Specimen: Tracheal Aspirate; Respiratory  Result Value Ref Range Status   Specimen Description TRACHEAL ASPIRATE  Final   Special Requests NONE  Final   Gram Stain   Final    NO WBC SEEN RARE GRAM POSITIVE COCCI RARE GRAM NEGATIVE RODS RARE GRAM POSITIVE RODS    Culture   Final    Normal respiratory flora-no Staph aureus or Pseudomonas seen Performed at [facility], [address] [place], [place] [address]    Report Status 2024-07-22 FINAL  Final  Blood Culture (routine x 2)     Status: None (Preliminary result)   Collection Time: 06/22/24  9:49 AM   Specimen: BLOOD LEFT HAND  Result Value Ref Range Status   Specimen Description BLOOD LEFT HAND  Final   Special Requests   Final    BOTTLES DRAWN AEROBIC ONLY Blood Culture results may not be optimal due to an inadequate volume of blood received in culture bottles   Culture   Final    NO GROWTH 2 DAYS Performed at [facility], [address] [place], [place] [address]    Report Status PENDING  Incomplete    Lab Basic Metabolic Panel: Recent Labs  Lab 06/22/24 1015 06/22/24 1051 06/22/24 2312 06/23/24 0017 06/23/24 0419 06/23/24 0812 06/23/24 1148 06/23/24 1705 22-Jul-2024 0230 Jul 22, 2024 0307 07-22-2024 0829  NA  --    < >  --  133* 132*   < > 131* 132* 141 128* 125*   K  --    < >  --  5.6* 5.5*   < > 5.6* 5.7* 6.1* 5.5* 5.9*  CL  --    < >  --  96* 95*  --  91* 91* 83*  --   --   CO2  --    < >  --  15* 15*  --  13* 15* 29  --   --   GLUCOSE  --    < >  --  148* 138*  --  152* 128* 99  --   --   BUN  --    < >  --  57* 58*  --  62* 59* 37*  --   --   CREATININE  --    < >  --  4.01* 4.24*  --  4.30* 4.12* 2.91*  --   --   CALCIUM   --    < >  --  6.5* 6.5*  --  6.4* 6.0* 5.7*  --   --   MG 1.7  --  2.1  --   --   --   --   --  2.4  --   --   PHOS 6.8*  --  6.0*  --   --   --  7.4*  --  6.9*  --   --    < > = values in this interval not displayed.   Liver Function Tests: Recent Labs  Lab 06/22/24 [id number] 06/22/24 1401 06/23/24 0419 06/23/24  1148 06/23/24 1705 June 28, 2024 0230  AST 512* 2,960* 2,143*  --  1,255* 1,674*  ALT 516* 1,959* 1,965*  --  1,726* 1,761*  ALKPHOS 39 39 57  --  78 81  BILITOT 1.4* 1.8* 2.8*  --  3.4* 4.4*  PROT 4.2* 4.2* 3.8*  --  3.3* 3.1*  ALBUMIN  2.2* 2.5* 1.9* 1.7* 1.5* 1.7*   Recent Labs  Lab 06/22/24 1015  LIPASE 25   Recent Labs  Lab 06/22/24 0945  AMMONIA 80*   CBC: Recent Labs  Lab 06/22/24 1304 06/22/24 1616 06/22/24 1814 06/23/24 0017 06/23/24 0419 06/23/24 0812 2024/06/28 0305 06/28/2024 0307 Jun 28, 2024 0456 06-28-24 0829  WBC 18.5*  --  15.5* 20.5* 19.2*  --  14.1*  --   --   --   NEUTROABS  --   --   --   --  18.4*  --  12.7*  --   --   --   HGB 12.2   < > 11.8* 12.5 12.4 11.9* 9.5* 8.8*  --  9.5*  HCT 39.4   < > 37.5 39.9 39.2 35.0* 29.9* 26.0*  --  28.0*  MCV 92.9  --  91.9 91.7 91.4  --  91.4  --   --   --   PLT 75*  72*  --  59* 55* 47*  --  28*  --  26*  --    < > = values in this interval not displayed.   Cardiac Enzymes: Recent Labs  Lab 06/22/24 0945 06/23/24 1148  CKTOTAL 5,641* 3,135*   Sepsis Labs: Recent Labs  Lab 06/22/24 0853 06/22/24 1218 06/22/24 1304 06/22/24 1453 06/22/24 1814 06/23/24 0017 06/23/24 0419 Jun 28, 2024 0305 Jun 28, 2024 0839  WBC  --   --    < >  --   15.5* 20.5* 19.2* 14.1*  --   LATICACIDVEN 6.2* 4.5*  --  3.9*  --   --   --   --  >9.0*   < > = values in this interval not displayed.    Procedures/Operations  Endotracheal Intubation Central line placement Arterial Line placement CRRT   Dorn KATHEE Chill 06-28-24, 4:18 PM

## 2024-07-02 NOTE — Progress Notes (Signed)
 NAME:  Miranda Cohen, MRN:  [id number], DOB:  Nov 09, 1946, LOS: 3 ADMISSION DATE:  06/21/2024  History of Present Illness:  78 yo F w/ pertinent PMH tobacco abuse, emphysema, previous PE 2021 on warfarin for 1 year, hypothyroidism, PAH, PAD presents to [facility] on 7/21 w/ AMS.   Patient recently seen by ED on 7/11 for right ankle pain/swelling. Dvt us  negative. Xray showing small bone fragment possible avulsion fracture. Sent home w/ pain meds and ankle wrapped. F/u w/ ortho on 7/14 who rx prednisone  taper for 10 days and hydrocodone.   On 7/21 patient having acute N/V/Diarrhea and abd pain in am. Per patient's daughter patient took her steroid taper much quicker than was prescribed. EMS called and cbg 30 given dextrose . Brought to [facility] ED. Placed on 4L [place] sats 90%. Patient tachypneic 20-30s w/ congested cough. Patient bp initially stable but became hypotensive. Temp 86F and wbc 21. Cultures obtained and given iv fluids. CXR w/ bibasilar disease and left pleural effusion. Started on rocephin /azithro. Covid/flu negative. LA 6 then 7.1. UA w/ rare bacteria. Despite iv fluids patient remained hypotensive requiring levo. Patient with increasing sob and having becoming more altered/confused. Placed on bipap but unable to tolerate. Patient intubated. PCCM consulted for icu admission. Of note, pt's RF lab test was high on 06/15/24, ANA negative, elevated CRP and sed rate.   Pertinent  Medical History  Emphysema on 4L [place] at baseline Chronic lymphocytic thyroiditis 2021 Hx PE 2021 Hypothyroidism, myxedema coma PAD PAH  Significant Hospital Events: Including procedures, antibiotic start and stop dates in addition to other pertinent events   7/21 admitted for ams and shock; worsening resp failure now intubated  7/22 amiodarone  gtt started for afib with rvr, acetylcysteine  started for suspected tylenol  toxicity, poison control contacted  7/23 started CRRT, NAC discontinued  Interim History / Subjective:   Added propofol  sedation briefly due to vent dyssynchrony, then became hypotensive and in afib with rvr. BP and HR improved after interventions.   Objective    Blood pressure (!) 140/33, pulse 94, temperature (!) 97.5 F (36.4 C), resp. rate (!) 28, height 5' 6 (1.676 m), weight 93.7 kg, SpO2 (!) 83%.    Vent Mode: PRVC FiO2 (%):  [50 %-60 %] 50 % Set Rate:  [22 bmp-28 bmp] 28 bmp Vt Set:  [460 mL] 460 mL PEEP:  [8 cmH20] 8 cmH20 Plateau Pressure:  [18 cmH20-23 cmH20] 18 cmH20   Intake/Output Summary (Last 24 hours) at Jun 29, 2024 1144 Last data filed at 2024/06/29 1100 Gross per 24 hour  Intake 6519.45 ml  Output 2298.3 ml  Net 4221.15 ml   Filed Weights   06/22/24 0432 06/23/24 0500 June 29, 2024 0500  Weight: 88.9 kg 93.9 kg 93.7 kg    Examination: General: ill appearing, on CRRT, intubated HENT: scleral icterus Lungs: intubated, diminished breath sounds bilaterally Abdomen: Soft, non-distended but mottled skin and cool to touch Extremities: Mottling BUE and BLE, extremities cool to touch bilaterally  Neuro: Not responsive to voice   Resolved problem list   Assessment and Plan  Shock, high AG metabolic acidosis Elevated LFTs Elevated LFTs likely in setting of shock, initially presumed septic 2/2 pulmonary source. Increased suspicion for accidental tylenol  overdose, after discussion with daughter it sounds like pt may have been mismanaging medications at home and was recently taking tylenol  for ankle pain. Also consider adrenal insufficiency 2/2 steroid misuse, although less likely as pt was prescribed very low dose of prednisone . Cortisol elevated although drawn after steroid stress  dosing was started.  - S/p IV acetylcysteine  (7/22-7/23), poison control signed off - on levophed , vasopressin , and epinephrine  gtt - wean for map goal >65 - vit K  - stress dose steroids tapering today 50mg  - Sodium bicarb 150meq at 148ml/h - trend LFTs, INR - f/u blood cultures   Acute  encephalopathy 2/2 above - limit sedating meds   Acute hypoxic hypercarbic respiratory failure, suspected CAP Hx emphysema PAH  Suspected component of septic shock from CAP - Zosyn , Azithromycin  (7/21-) for pneumonia coverage - continue vent support, wean as able - monitor ABG - brovana , pulmicort  nebs, prn duoneb - hypertonic NaCl neb q6h - pulmonary hygiene - f/u tracheal aspirate    Afib with RVR No history of atrial fibrillation. Echo 7/22 with new LVEF 40-45%, LV global hypokinesis, moderate LV hypertrophy, normal pulmonary artery systolic pressure. Moderate pericardial effusion noted, previously seen on echo in 2021. BNP elevated at 3589.  - Amiodarone  gtt - bolus over 30 minutes    AKI on CKD3a Appears baseline creatinine 1.6, increased to 4.2 this morning. Pt still has not had urinary output. Bladder scan showed 0ml yesterday. She is net +15L.  - trend BMP, UOP - avoid nephrotoxic agents  Hypoglycemia IV D5 at 40ml/h CBG checks   Hyperkalemia Correct as indicated   Hyponatremia  CTM   Hypothyroidism - continue home synthroid  112mcg daily   PAD - Holding home statin due to LFTs  Best Practice (right click and Reselect all SmartList Selections daily)   Diet/type: trickle feeds DVT prophylaxis SCD Pressure ulcer(s): N/A GI prophylaxis: PPI Lines: Central line, Dialysis Catheter, and Arterial Line Foley:  Yes, and it is still needed Code Status:  full code Last date of multidisciplinary goals of care discussion [pending]  Labs   CBC: Recent Labs  Lab 06/22/24 1304 06/22/24 1616 06/22/24 1814 06/23/24 0017 06/23/24 0419 06/23/24 0812 07-10-2024 0305 Jul 10, 2024 0307 2024-07-10 0456 2024-07-10 0829  WBC 18.5*  --  15.5* 20.5* 19.2*  --  14.1*  --   --   --   NEUTROABS  --   --   --   --  18.4*  --  12.7*  --   --   --   HGB 12.2   < > 11.8* 12.5 12.4 11.9* 9.5* 8.8*  --  9.5*  HCT 39.4   < > 37.5 39.9 39.2 35.0* 29.9* 26.0*  --  28.0*  MCV 92.9  --   91.9 91.7 91.4  --  91.4  --   --   --   PLT 75*  72*  --  59* 55* 47*  --  28*  --  26*  --    < > = values in this interval not displayed.    Basic Metabolic Panel: Recent Labs  Lab 06/22/24 1015 06/22/24 1051 06/22/24 2312 06/23/24 0017 06/23/24 0419 06/23/24 0812 06/23/24 1148 06/23/24 1705 Jul 10, 2024 0230 07-10-2024 0307 07/10/2024 0829  NA  --    < >  --  133* 132*   < > 131* 132* 141 128* 125*  K  --    < >  --  5.6* 5.5*   < > 5.6* 5.7* 6.1* 5.5* 5.9*  CL  --    < >  --  96* 95*  --  91* 91* 83*  --   --   CO2  --    < >  --  15* 15*  --  13* 15* 29  --   --  GLUCOSE  --    < >  --  148* 138*  --  152* 128* 99  --   --   BUN  --    < >  --  57* 58*  --  62* 59* 37*  --   --   CREATININE  --    < >  --  4.01* 4.24*  --  4.30* 4.12* 2.91*  --   --   CALCIUM   --    < >  --  6.5* 6.5*  --  6.4* 6.0* 5.7*  --   --   MG 1.7  --  2.1  --   --   --   --   --  2.4  --   --   PHOS 6.8*  --  6.0*  --   --   --  7.4*  --  6.9*  --   --    < > = values in this interval not displayed.   GFR: Estimated Creatinine Clearance: 18.1 mL/min (A) (by C-G formula based on SCr of 2.91 mg/dL (H)). Recent Labs  Lab 06/22/24 0853 06/22/24 1218 06/22/24 1304 06/22/24 1453 06/22/24 1814 06/23/24 0017 06/23/24 0419 07-05-24 0305 2024/07/05 0839  WBC  --   --    < >  --  15.5* 20.5* 19.2* 14.1*  --   LATICACIDVEN 6.2* 4.5*  --  3.9*  --   --   --   --  >9.0*   < > = values in this interval not displayed.    Liver Function Tests: Recent Labs  Lab 06/22/24 0817 06/22/24 1401 06/23/24 0419 06/23/24 1148 06/23/24 1705 05-Jul-2024 0230  AST 512* 2,960* 2,143*  --  1,255* 1,674*  ALT 516* 1,959* 1,965*  --  1,726* 1,761*  ALKPHOS 39 39 57  --  78 81  BILITOT 1.4* 1.8* 2.8*  --  3.4* 4.4*  PROT 4.2* 4.2* 3.8*  --  3.3* 3.1*  ALBUMIN  2.2* 2.5* 1.9* 1.7* 1.5* 1.7*   Recent Labs  Lab 06/22/24 1015  LIPASE 25   Recent Labs  Lab 06/22/24 0945  AMMONIA 80*    ABG    Component Value  Date/Time   PHART 7.247 (L) 07-05-24 0829   PCO2ART 38.1 07-05-24 0829   PO2ART 93 07/05/24 0829   HCO3 16.7 (L) 07/05/2024 0829   TCO2 18 (L) 05-Jul-2024 0829   ACIDBASEDEF 10.0 (H) 05-Jul-2024 0829   O2SAT 96 2024-07-05 0829     Coagulation Profile: Recent Labs  Lab 06/22/24 1304 06/23/24 0419 06/23/24 1705 07/05/2024 0230 05-Jul-2024 0456  INR 2.8* 2.2* 1.9* 3.5* 4.5*    Cardiac Enzymes: Recent Labs  Lab 06/22/24 0945 06/23/24 1148  CKTOTAL 5,641* 3,135*    HbA1C: Hgb A1c MFr Bld  Date/Time Value Ref Range Status  06/22/2024 10:15 AM 6.3 (H) 4.8 - 5.6 % Final    Comment:    (NOTE) Diagnosis of Diabetes The following HbA1c ranges recommended by the American Diabetes Association (ADA) may be used as an aid in the diagnosis of diabetes mellitus.  Hemoglobin             Suggested A1C NGSP%              Diagnosis  <5.7                   Non Diabetic  5.7-6.4                Pre-Diabetic  >6.4  Diabetic  <7.0                   Glycemic control for                       adults with diabetes.    08/06/2020 07:00 AM 6.0 (H) 4.8 - 5.6 % Final    Comment:    (NOTE) Pre diabetes:          5.7%-6.4%  Diabetes:              >6.4%  Glycemic control for   <7.0% adults with diabetes     CBG: Recent Labs  Lab 06/30/2024 0318 06-30-2024 0333 June 30, 2024 0731 30-Jun-2024 0824 June 30, 2024 1109  GLUCAP 47* 204* 41* 142* 128*    Review of Systems:   Sedated, intubated  Past Medical History:  She,  has a past medical history of Acute metabolic encephalopathy, Aortic atherosclerosis (HCC) (08/18/2020), Aortic atherosclerosis (HCC), Arthritis, Chronic lymphocytic thyroiditis (08/18/2020), Coronary artery calcification, Coronary atherosclerosis, Dyspnea, Emphysema lung (HCC) (08/18/2020), History of pulmonary embolism, Hypothyroidism, Myxedema coma (HCC), PAD (peripheral artery disease) (HCC), Pericardial effusion, Pulmonary arterial hypertension (HCC),  Pulmonary embolism (HCC) (08/2020), Sepsis (HCC), and Wears dentures.   Surgical History:   Past Surgical History:  Procedure Laterality Date   CATARACT EXTRACTION W/PHACO Right 08/19/2022   Procedure: CATARACT EXTRACTION PHACO AND INTRAOCULAR LENS PLACEMENT (IOC) RIGHT;  Surgeon: Myrna Adine Anes, MD;  Location: [facility];  Service: Ophthalmology;  Laterality: Right;  10.29 1:00.3   CATARACT EXTRACTION W/PHACO Left 10/20/2023   Procedure: CATARACT EXTRACTION PHACO AND INTRAOCULAR LENS PLACEMENT (IOC) LEFT 10.80 01:06.3;  Surgeon: Myrna Adine Anes, MD;  Location: [facility];  Service: Ophthalmology;  Laterality: Left;   CYST EXCISION Left 01/28/2024   Procedure: CYST REMOVAL;  Surgeon: Tye Millet, DO;  Location: [facility];  Service: General;  Laterality: Left;   TUBAL LIGATION       Social History:   reports that she has been smoking cigarettes. She started smoking about 62 years ago. She has a 61.6 pack-year smoking history. She has never used smokeless tobacco. She reports that she does not drink alcohol  and does not use drugs.   Family History:  Her family history is not on file.   Allergies No Known Allergies   Home Medications  Prior to Admission medications   Medication Sig Start Date End Date Taking? Authorizing Provider  atorvastatin (LIPITOR) 20 MG tablet Take 20 mg by mouth daily. 12/16/23 12/15/24 Yes [provider]  diclofenac Sodium (VOLTAREN) 1 % GEL Apply 2 g topically daily as needed (Pain). 12/16/23  Yes [provider]  furosemide  (LASIX ) 20 MG tablet Take 20 mg by mouth daily as needed for edema.   Yes [provider]  HYDROcodone-acetaminophen  (NORCO/VICODIN) 5-325 MG tablet Take 1 tablet by mouth 4 (four) times daily as needed (for pain).   Yes [provider]  levothyroxine  (SYNTHROID ) 112 MCG tablet Take 112 mcg by mouth daily before breakfast. 02/12/22  Yes [provider]  mometasone -formoterol   (DULERA ) 200-5 MCG/ACT AERO Inhale 2 puffs into the lungs 2 (two) times daily.   Yes [provider]  OXYGEN Inhale 4 L/min into the lungs continuous.   Yes [provider]  Cholecalciferol (VITAMIN D3) 75 MCG (3000 UT) TABS Take 3,000 mcg by mouth in the morning. Patient not taking: Reported on 06/21/2024 01/28/24   Sakai, Isami, DO  cyanocobalamin (VITAMIN B12) 1000 MCG tablet Take  3 tablets (3,000 mcg total) by mouth in the morning. Patient not taking: Reported on 06/21/2024 01/28/24   Sakai, Isami, DO  traMADol  (ULTRAM ) 50 MG tablet Take 1 tablet (50 mg total) by mouth every 6 (six) hours as needed for up to 8 doses. Patient not taking: Reported on 06/21/2024 01/28/24   Sakai, Isami, DO     Critical care time: 30 minutes

## 2024-07-02 NOTE — Progress Notes (Signed)
 Patients Trialysis catheter is very positional in her RIJ. Filter changed X1 and catheter taped to continue to run CRRT. Anytime there is movement, access pressure negative alarms and nurse spends 1hr plus in patients room to get the machine to start working again. Will continue to follow patients ongoing careplan.

## 2024-07-02 NOTE — Progress Notes (Signed)
 eLink Physician-Brief Progress Note Patient Name: [name] DOB: 1946/03/09 MRN: [id number]   Date of Service  2024/07/10  HPI/Events of Note  PTT 175, PT 36.9, INR 3.5, Platelet count 28 K.  eICU Interventions  DIC panel ordered to confirm recent abnormal lab results and to r/o evolving DIC.        Aviyana Sonntag U Jimmylee Ratterree Jul 10, 2024, 4:56 AM

## 2024-07-02 NOTE — Progress Notes (Signed)
 Patient ID: Miranda Cohen, female   DOB: 1946-03-11, 78 y.o.   MRN: [id number]  [facility] Progress Note   Assessment/ Plan:   1.  Acute kidney injury: Appears consistent with ATN probably with provoking infection +/- hypotension related to adrenal insufficiency.  Minimal impact from rhabdomyolysis.  Started on CRRT yesterday with worsening hyperkalemia/metabolic acidosis and lack of response to furosemide  challenge.  Some problems with catheter functioning due to ventilator dyssynchrony that has been solved overnight by the CCM team.  Continue current prescription and discontinue CRRT circuit heparin  while increasing prefilter fluid. 2.  Anion gap metabolic acidosis: Secondary to acute kidney injury +/- lactic acidosis from hypoperfusion.  Improving with CRRT.  Will switch CRRT prescription based on subsequent bicarbonate levels/blood gas (from post filter sodium bicarbonate  to PrismaSol ). 3.  Hyperkalemia: Secondary to acute kidney injury, elevated this morning and agree with adding Lokelma .  Anticipate definitive improvement with prolonged/more sustained CRRT. 4.  Acute metabolic encephalopathy/shock/elevated LFTs: Started on empiric therapy for infection process suspected to be of pulmonary origin along with supportive measures for shock.  Earlier treated for APAP toxicity with acetylcysteine .  Subjective:   Problems with ventilator dyssynchrony and CRRT interruption with pressures/catheter positioning noted overnight.   Objective:   BP (!) 140/33   Pulse (!) 101   Temp 97.7 F (36.5 C)   Resp (!) 28   Ht 5' 6 (1.676 m)   Wt 93.7 kg   SpO2 98%   BMI 33.34 kg/m   Intake/Output Summary (Last 24 hours) at 07/17/2024 0744 Last data filed at 07/17/24 0700 Gross per 24 hour  Intake 6168.26 ml  Output 1339.1 ml  Net 4829.16 ml   Weight change: -0.2 kg  Physical Exam: Gen: Appears comfortable, sedated on CRRT CVS: Pulse regular tachycardia, S1 and S2 normal Resp:  Decreased breath sounds over bases, no distinct rales/rhonchi Abd: Soft, flat, nontender, bowel sounds normal Ext: Cyanotic/mottled upper and lower extremities that are cool to touch.  Imaging: DG CHEST PORT 1 VIEW Result Date: 06/23/2024 CLINICAL DATA:  Central line placement EXAM: PORTABLE CHEST 1 VIEW COMPARISON:  Yesterday FINDINGS: Endotracheal tube terminates 3.5 cm above carina. Nasogastric tube extends beyond the inferior aspect of the film. Left internal jugular line tip over mid to low SVC, poorly visualized due to overlying nasogastric tube. There is a double-lumen right internal jugular line which also terminates over the low SVC. Moderate cardiomegaly. Small to moderate left pleural effusion is unchanged. No pneumothorax. Mild interstitial edema is decreased. Left greater than right base airspace disease is not significantly changed. IMPRESSION: Right-sided internal jugular line terminating over the mid to low SVC, without pneumothorax. Cardiomegaly with improved congestive heart failure. No change in left pleural effusion and bibasilar airspace disease. Electronically Signed   By: Miranda Cohen M.D.   On: 06/23/2024 16:17   VAS US  UPPER EXTREMITY ARTERIAL DUPLEX Result Date: 06/23/2024  UPPER EXTREMITY DUPLEX STUDY Patient Name:  Miranda Cohen  Date of Exam:   06/23/2024 Medical Rec #: [id number]        Accession #:    [id number] Date of Birth: 01/19/1946         Patient Gender: F Patient Age:   61 years Exam Location:  [facility] Procedure:      VAS US  UPPER EXTREMITY ARTERIAL DUPLEX Referring Phys: Miranda Cohen --------------------------------------------------------------------------------  Indications: Cold extremity, PAD.  Performing Technologist: Miranda Cohen, RVT  Examination Guidelines: A complete evaluation includes B-mode imaging, spectral Doppler, color Doppler, and  power Doppler as needed of all accessible portions of each vessel. Bilateral testing is considered an  integral part of a complete examination. Limited examinations for reoccurring indications may be performed as noted.  Right Doppler Findings: +---------------+----------+---------+--------+--------+ Site           PSV (cm/s)Waveform StenosisComments +---------------+----------+---------+--------+--------+ Subclavian Prox164       biphasic                  +---------------+----------+---------+--------+--------+ Subclavian Mid 58        biphasic                  +---------------+----------+---------+--------+--------+ Subclavian Dist47        biphasic                  +---------------+----------+---------+--------+--------+ Axillary       62        biphasic                  +---------------+----------+---------+--------+--------+ Brachial Prox  46        triphasic                 +---------------+----------+---------+--------+--------+ Brachial Mid   109       triphasic                 +---------------+----------+---------+--------+--------+ Brachial Dist                             bandage  +---------------+----------+---------+--------+--------+ Radial Prox                               bandage  +---------------+----------+---------+--------+--------+ Radial Mid                                IV       +---------------+----------+---------+--------+--------+ Radial Dist    62        biphasic                  +---------------+----------+---------+--------+--------+ Ulnar Prox                                bandage  +---------------+----------+---------+--------+--------+ Ulnar Mid                                 IV       +---------------+----------+---------+--------+--------+ Ulnar Dist     39        biphasic                  +---------------+----------+---------+--------+--------+ No hemodynamically significant stenosis or occlusion is seen in the right upper extremity arterial system.    Summary:  Right: No obstruction visualized  in the right upper extremity. *See table(s) above for measurements and observations.    Preliminary    ECHOCARDIOGRAM COMPLETE Result Date: 06/22/2024    ECHOCARDIOGRAM REPORT   Patient Name:   Miranda Cohen Date of Exam: 06/22/2024 Medical Rec #:  [id number]       Height:       65.0 in Accession #:    [id number]      Weight:       196.0 lb Date of Birth:  02-04-1946  BSA:          1.961 m Patient Age:    77 years        BP:           95/22 mmHg Patient Gender: F               HR:           87 bpm. Exam Location:  Inpatient Procedure: 2D Echo, Color Doppler and Cardiac Doppler (Both Spectral and Color            Flow Doppler were utilized during procedure). Indications:    Shock  History:        Patient has prior history of Echocardiogram examinations, most                 recent 08/06/2020. Septic shock.  Sonographer:    Benard Stallion Referring Phys: [id number] NORLEEN D PAYNE IMPRESSIONS  1. Left ventricular ejection fraction, by estimation, is 40 to 45%. The left ventricle has mildly decreased function. The left ventricle demonstrates global hypokinesis. There is moderate concentric left ventricular hypertrophy. Left ventricular diastolic parameters are indeterminate.  2. Right ventricular systolic function is normal. The right ventricular size is normal. There is normal pulmonary artery systolic pressure.  3. Moderate pericardial effusion. The pericardial effusion is posterior to the left ventricle and circumferential.  4. The mitral valve is degenerative. Trivial mitral valve regurgitation. Moderate mitral annular calcification.  5. The aortic valve is tricuspid. Aortic valve regurgitation is not visualized. No aortic stenosis is present. Comparison(s): Prior images reviewed side by side. Left ventricular function has worsened. Pericardial effusion has decreased. FINDINGS  Left Ventricle: Left ventricular ejection fraction, by estimation, is 40 to 45%. The left ventricle has mildly decreased function. The  left ventricle demonstrates global hypokinesis. The left ventricular internal cavity size was normal in size. There is  moderate concentric left ventricular hypertrophy. Left ventricular diastolic parameters are indeterminate. Right Ventricle: The right ventricular size is normal. No increase in right ventricular wall thickness. Right ventricular systolic function is normal. There is normal pulmonary artery systolic pressure. The tricuspid regurgitant velocity is 1.54 m/s, and  with an assumed right atrial pressure of 8 mmHg, the estimated right ventricular systolic pressure is 17.5 mmHg. Left Atrium: Left atrial size was normal in size. Right Atrium: Right atrial size was normal in size. Pericardium: A moderately sized pericardial effusion is present. The pericardial effusion is posterior to the left ventricle and circumferential. Mitral Valve: The mitral valve is degenerative in appearance. Moderate mitral annular calcification. Trivial mitral valve regurgitation. The mean mitral valve gradient is 2.0 mmHg with average heart rate of 87 bpm. Tricuspid Valve: The tricuspid valve is normal in structure. Tricuspid valve regurgitation is not demonstrated. No evidence of tricuspid stenosis. Aortic Valve: The aortic valve is tricuspid. Aortic valve regurgitation is not visualized. No aortic stenosis is present. Aortic valve mean gradient measures 2.0 mmHg. Aortic valve peak gradient measures 4.0 mmHg. Aortic valve area, by VTI measures 3.41 cm. Pulmonic Valve: The pulmonic valve was not well visualized. Pulmonic valve regurgitation is mild. No evidence of pulmonic stenosis. Aorta: The aortic root is normal in size and structure. IAS/Shunts: The atrial septum is grossly normal.  LEFT VENTRICLE PLAX 2D LVIDd:         3.40 cm     Diastology LVIDs:         2.90 cm     LV e' medial:    5.00 cm/s LV PW:  1.40 cm     LV E/e' medial:  17.6 LV IVS:        1.40 cm     LV e' lateral:   5.44 cm/s LVOT diam:     2.10 cm      LV E/e' lateral: 16.2 LV SV:         64 LV SV Index:   33 LVOT Area:     3.46 cm  LV Volumes (MOD) LV vol d, MOD A2C: 47.0 ml LV vol d, MOD A4C: 31.1 ml LV vol s, MOD A2C: 25.4 ml LV vol s, MOD A4C: 19.6 ml LV SV MOD A2C:     21.6 ml LV SV MOD A4C:     31.1 ml LV SV MOD BP:      16.7 ml RIGHT VENTRICLE RV Basal diam:  3.30 cm RV Mid diam:    2.80 cm RV S prime:     15.30 cm/s TAPSE (M-mode): 1.4 cm LEFT ATRIUM             Index        RIGHT ATRIUM           Index LA diam:        3.90 cm 1.99 cm/m   RA Area:     15.40 cm LA Vol (A2C):   47.8 ml 24.37 ml/m  RA Volume:   32.70 ml  16.67 ml/m LA Vol (A4C):   30.1 ml 15.35 ml/m LA Biplane Vol: 38.9 ml 19.84 ml/m  AORTIC VALVE AV Area (Vmax):    3.29 cm AV Area (Vmean):   3.19 cm AV Area (VTI):     3.41 cm AV Vmax:           100.00 cm/s AV Vmean:          70.600 cm/s AV VTI:            0.188 m AV Peak Grad:      4.0 mmHg AV Mean Grad:      2.0 mmHg LVOT Vmax:         94.90 cm/s LVOT Vmean:        65.000 cm/s LVOT VTI:          0.185 m LVOT/AV VTI ratio: 0.98  AORTA Ao Root diam: 3.00 cm MITRAL VALVE                TRICUSPID VALVE MV Area (PHT): 3.77 cm     TR Peak grad:   9.5 mmHg MV Mean grad:  2.0 mmHg     TR Vmax:        154.00 cm/s MV Decel Time: 201 msec MV E velocity: 88.00 cm/s   SHUNTS MV A velocity: 129.00 cm/s  Systemic VTI:  0.18 m MV E/A ratio:  0.68         Systemic Diam: 2.10 cm Stanly Leavens MD Electronically signed by Stanly Leavens MD Signature Date/Time: 06/22/2024/1:31:09 PM    Final     Labs: BMET Recent Labs  Lab 06/22/24 9182 06/22/24 0902 06/22/24 1015 06/22/24 1051 06/22/24 1401 06/22/24 1616 06/22/24 2312 06/23/24 0017 06/23/24 0419 06/23/24 0812 06/23/24 1148 06/23/24 1705 2024-07-15 0230 Jul 15, 2024 0307  NA 130*   < >  --    < > 132*   < >  --  133* 132* 127* 131* 132* 141 128*  K 5.5*   < >  --    < > 5.3*   < >  --  5.6*  5.5* 5.3* 5.6* 5.7* 6.1* 5.5*  CL 100  --   --   --  100  --   --  96* 95*  --   91* 91* 83*  --   CO2 11*  --   --   --  16*  --   --  15* 15*  --  13* 15* 29  --   GLUCOSE 299*  --   --   --  169*  --   --  148* 138*  --  152* 128* 99  --   BUN 49*  --   --   --  53*  --   --  57* 58*  --  62* 59* 37*  --   CREATININE 3.94*  --   --   --  3.76*  --   --  4.01* 4.24*  --  4.30* 4.12* 2.91*  --   CALCIUM  6.8*  --   --   --  6.6*  --   --  6.5* 6.5*  --  6.4* 6.0* 5.7*  --   PHOS  --   --  6.8*  --   --   --  6.0*  --   --   --  7.4*  --  6.9*  --    < > = values in this interval not displayed.   CBC Recent Labs  Lab 06/22/24 1814 06/23/24 0017 06/23/24 0419 06/23/24 0812 July 06, 2024 0305 2024/07/06 0307 2024-07-06 0456  WBC 15.5* 20.5* 19.2*  --  14.1*  --   --   NEUTROABS  --   --  18.4*  --  12.7*  --   --   HGB 11.8* 12.5 12.4 11.9* 9.5* 8.8*  --   HCT 37.5 39.9 39.2 35.0* 29.9* 26.0*  --   MCV 91.9 91.7 91.4  --  91.4  --   --   PLT 59* 55* 47*  --  28*  --  26*    Medications:     arformoterol   15 mcg Nebulization Q12H   budesonide  (PULMICORT ) nebulizer solution  0.5 mg Nebulization BID   Chlorhexidine  Gluconate Cloth  6 each Topical Daily   docusate  100 mg Per Tube BID   levothyroxine   112 mcg Per Tube QAC breakfast   mouth rinse  15 mL Mouth Rinse Q2H   pantoprazole  (PROTONIX ) IV  40 mg Intravenous Q12H   polyethylene glycol  17 g Per Tube Daily   sodium chloride  flush  10-40 mL Intracatheter Q12H   sodium chloride  flush  3 mL Intravenous Q12H   sodium chloride  HYPERTONIC  4 mL Nebulization Q6H   thiamine   100 mg Per Tube Daily    Gordy Blanch, MD 06-Jul-2024, 7:44 AM

## 2024-07-02 DEATH — deceased
